# Patient Record
Sex: Female | Born: 1940
Health system: Southern US, Community
[De-identification: ages and names within clinical notes are randomized; demographics above are authoritative.]

## PROBLEM LIST (undated history)

## (undated) DIAGNOSIS — I499 Cardiac arrhythmia, unspecified: Secondary | ICD-10-CM

## (undated) DIAGNOSIS — M1990 Unspecified osteoarthritis, unspecified site: Secondary | ICD-10-CM

## (undated) DIAGNOSIS — R112 Nausea with vomiting, unspecified: Secondary | ICD-10-CM

## (undated) DIAGNOSIS — K219 Gastro-esophageal reflux disease without esophagitis: Secondary | ICD-10-CM

## (undated) DIAGNOSIS — M810 Age-related osteoporosis without current pathological fracture: Secondary | ICD-10-CM

## (undated) DIAGNOSIS — R002 Palpitations: Secondary | ICD-10-CM

## (undated) DIAGNOSIS — L659 Nonscarring hair loss, unspecified: Secondary | ICD-10-CM

## (undated) DIAGNOSIS — Z9889 Other specified postprocedural states: Secondary | ICD-10-CM

## (undated) DIAGNOSIS — R1900 Intra-abdominal and pelvic swelling, mass and lump, unspecified site: Secondary | ICD-10-CM

## (undated) DIAGNOSIS — E7800 Pure hypercholesterolemia, unspecified: Secondary | ICD-10-CM

## (undated) DIAGNOSIS — R0789 Other chest pain: Secondary | ICD-10-CM

## (undated) HISTORY — PX: APPENDECTOMY: SHX54

## (undated) HISTORY — DX: Nonscarring hair loss, unspecified: L65.9

## (undated) HISTORY — DX: Unspecified osteoarthritis, unspecified site: M19.90

## (undated) HISTORY — PX: TOTAL KNEE ARTHROPLASTY: SHX125

## (undated) HISTORY — DX: Pure hypercholesterolemia, unspecified: E78.00

## (undated) HISTORY — DX: Other chest pain: R07.89

## (undated) HISTORY — PX: BREAST BIOPSY: SHX20

## (undated) HISTORY — DX: Age-related osteoporosis without current pathological fracture: M81.0

## (undated) HISTORY — DX: Palpitations: R00.2

## (undated) HISTORY — PX: COLONOSCOPY: SHX174

## (undated) HISTORY — DX: Intra-abdominal and pelvic swelling, mass and lump, unspecified site: R19.00

---

## 1999-04-24 ENCOUNTER — Other Ambulatory Visit: Admission: RE | Admit: 1999-04-24 | Discharge: 1999-04-24 | Payer: Self-pay | Admitting: Obstetrics and Gynecology

## 2000-05-12 ENCOUNTER — Other Ambulatory Visit: Admission: RE | Admit: 2000-05-12 | Discharge: 2000-05-12 | Payer: Self-pay | Admitting: Obstetrics and Gynecology

## 2002-12-02 ENCOUNTER — Other Ambulatory Visit: Admission: RE | Admit: 2002-12-02 | Discharge: 2002-12-02 | Payer: Self-pay | Admitting: Obstetrics and Gynecology

## 2003-12-27 ENCOUNTER — Other Ambulatory Visit: Admission: RE | Admit: 2003-12-27 | Discharge: 2003-12-27 | Payer: Self-pay | Admitting: Obstetrics and Gynecology

## 2004-08-15 ENCOUNTER — Other Ambulatory Visit: Admission: RE | Admit: 2004-08-15 | Discharge: 2004-08-15 | Payer: Self-pay | Admitting: Obstetrics and Gynecology

## 2006-05-28 ENCOUNTER — Encounter: Payer: Self-pay | Admitting: Cardiovascular Disease

## 2006-07-20 ENCOUNTER — Inpatient Hospital Stay (HOSPITAL_COMMUNITY): Admission: RE | Admit: 2006-07-20 | Discharge: 2006-07-22 | Payer: Self-pay | Admitting: Orthopedic Surgery

## 2006-07-21 ENCOUNTER — Ambulatory Visit: Payer: Self-pay | Admitting: Vascular Surgery

## 2006-07-21 ENCOUNTER — Encounter: Payer: Self-pay | Admitting: Vascular Surgery

## 2006-12-08 ENCOUNTER — Encounter: Payer: Self-pay | Admitting: Cardiovascular Disease

## 2008-07-05 ENCOUNTER — Encounter: Payer: Self-pay | Admitting: Cardiovascular Disease

## 2009-01-17 ENCOUNTER — Encounter: Payer: Self-pay | Admitting: Cardiovascular Disease

## 2009-02-19 ENCOUNTER — Encounter: Payer: Self-pay | Admitting: Cardiovascular Disease

## 2009-02-19 ENCOUNTER — Ambulatory Visit: Payer: Self-pay | Admitting: Internal Medicine

## 2009-02-27 ENCOUNTER — Telehealth (INDEPENDENT_AMBULATORY_CARE_PROVIDER_SITE_OTHER): Payer: Self-pay | Admitting: *Deleted

## 2009-02-28 ENCOUNTER — Ambulatory Visit: Payer: Self-pay | Admitting: Internal Medicine

## 2009-02-28 ENCOUNTER — Ambulatory Visit: Payer: Self-pay

## 2009-02-28 ENCOUNTER — Encounter: Payer: Self-pay | Admitting: Internal Medicine

## 2009-02-28 ENCOUNTER — Encounter (HOSPITAL_COMMUNITY): Admission: RE | Admit: 2009-02-28 | Discharge: 2009-04-06 | Payer: Self-pay | Admitting: Internal Medicine

## 2009-02-28 ENCOUNTER — Encounter: Payer: Self-pay | Admitting: Cardiology

## 2009-02-28 DIAGNOSIS — R0989 Other specified symptoms and signs involving the circulatory and respiratory systems: Secondary | ICD-10-CM

## 2009-02-28 HISTORY — DX: Other specified symptoms and signs involving the circulatory and respiratory systems: R09.89

## 2009-07-18 ENCOUNTER — Encounter: Payer: Self-pay | Admitting: Internal Medicine

## 2009-07-26 ENCOUNTER — Encounter: Payer: Self-pay | Admitting: Internal Medicine

## 2010-05-09 NOTE — Letter (Signed)
Summary: [facility]  [facility] Office Note   Imported By: Roderic Ovens 09/24/2009 14:34:33  _____________________________________________________________________  External Attachment:    Type:   Image     Comment:   External Document

## 2010-05-09 NOTE — Progress Notes (Signed)
Summary: Nuclear Pre-Procedure  Phone Note Outgoing Call   Call placed by: Allen Kell Summary of Call: Reviewed information on Myoview Information Sheet (see scanned document for further details).  Spoke with patient.     Nuclear Med Background Indications for Stress Test: Evaluation for Ischemia   History: Echo  History Comments: '08 ECHO (stress-NL) Hx: Afib, abdominal mass, AAA,  H/O recurrant tachy palp (probably SVT).  Symptoms: Chest Pain, Chest Pain with Exertion    Nuclear Pre-Procedure Cardiac Risk Factors: Family History - CAD, Lipids, NIDDM  Nuclear Med Study Referring MD:  Odessa Fleming

## 2010-07-20 ENCOUNTER — Encounter: Payer: Self-pay | Admitting: Cardiovascular Disease

## 2010-08-20 NOTE — Letter (Signed)
February 19, 2009    Gwendlyn Deutscher, M.D.  [address]  [place], [place] [address]   RE:  KANEISHA, ELLENBERGER  MRN:  [id number]  /  DOB:  1940-11-11   Dear Jonny Ruiz,   It is a pleasure seeing Katasha Riga at your request because of her  hypercholesterolemia.  You know her very well.  She is a 70 year old  woman whose husband recently died.  She has a history of diabetes and  hypercholesterolemia and has had various therapies over the last 15  years including statins, acetamide, fenofibrates and niacin.  She has  not tolerated any of these for a long period of time with muscle aches  requiring the termination of most of them.  She was on Crestor which she  tolerated quite well and her insurance company denied payment for it.  She then took simvastatin and apparently broke out in hives.  These  medications were discontinued.   She has a history of exertional chest discomfort which is somewhat  atypical and has prolonged duration.  It is also unassociated with  shortness of breath, radiation, nausea or vomiting.  Her cardiac risk  factors are notable for family history, her hypercholesterolemia,  diabetes and she also has a family history of abdominal aortic aneurysm.   PAST CARDIAC HISTORY:  Also notable for history of atrial  fibrillation.  This dates back to when she was pregnant with her son,  42 years ago.  She recalls the abrupt onset and abrupt offset of tachy  palpitations which she recognizes as fast and regular as opposed to fast  and irregular.  About 5 or 10 years later she was put on digoxin for the  same symptoms.  She has been on it ever since and the medication has  done a pretty good job of controlling it.  As best as she can recall,  there has never been documentation of the tachycardia.   PAST MEDICAL HISTORY:  In addition to the above is notable for a heart  murmur dating back many, many years.  In addition she has allergies, GE  reflux disease.   PAST SURGICAL  HISTORY:  Notable for appendectomy and total knee  replacement.   FAMILY HISTORY:  As noted previously.   SOCIAL HISTORY:  Her husband has recently died.  She has 2 children.  She is retired from [employer].  She does not use cigarettes, alcohol or  recreational drugs.   REVIEW OF SYSTEMS:  Broadly negative apart from what was noted  previously in the past medical history and the HPI.   Review of old records includes a stress echo from 2008, that was normal.   PHYSICAL EXAMINATION:  GENERAL:  She is an elderly Caucasian female  appearing younger than her stated age of 19.  VITAL SIGNS:  Her blood pressure is 118/68, her pulse is 70, her weight  is 135.  She is in no acute distress.  HEENT:  Demonstrated no icterus or xanthomata.  NECK:  Neck veins were flat.  The carotids are brisk and full  bilaterally without bruits.  There is no lymphadenopathy.  BACK:  Without kyphosis or scoliosis.  There is no CVA tenderness.  LUNGS:  Clear.  HEART:  Sounds were regular with a normal S1 and S2.  There is a 2/6 mid  peaking murmur heard best at the left lower sternal border radiating to  the right upper sternal border, not to the apex.  ABDOMEN:  Soft with active bowel  sounds with a broad midline pulsation.  EXTREMITIES:  Femoral pulses were 2+, distal pulses were intact.  There  is no clubbing, cyanosis or edema.  NEUROLOGIC:  Grossly normal.  Skin was warm and dry.  The affect was  appropriate, notwithstanding with intercurrent death of her husband.   Electrocardiogram dated today demonstrated sinus rhythm at 64 with  intervals at 0.15/ 0.19/ 0.41, the axis was leftward and -30.   Lipid panel that you kindly sent included a total cholesterol of 265  with an LDL of 174, and HDL of 38 and triglycerides of 266.   IMPRESSION:  1. Hypercholesterolemia.  2. Diabetes, highly is in need for therapy for hypercholesterolemia.  3. Exertional chest discomfort with cardiac risk factors notable  for;      a.     Above.      b.     Family history.  4. Abdominal mass with a family history of abdominal aortic aneurysm.  5. Recurrent tachy palpitations, probably not atrial fibrillation and      probably supraventricular tachycardia.   DISCUSSION:  John, Ms. Germany's cholesterol issues are a difficult  conundrum.  The first question is that now that she has had a hive  reaction to a statin, does that preclude therapy with other statins like  it does with other classes of medications like ACEs.  I will have to  talk to an allergist about this.  In the event that that is the case, I  wonder if we are not going to be limited to resins.  We also talk to our  [facility] and see if they have any other thoughts.   John, the second issue is her exertional chest discomfort.  This has  been a progressive thing and I think with her multiple cardiac risk  factors, it is probably reasonable to undertake Myoview scanning.  My  concern is further heightened by the presence of a broad abdominal mass  which certainly has a feel of an aneurysm.  It turns out that her  brother has an abdominal aortic aneurysm.  We know that these are often  transmitted in an autosomal dominant way.  The presence of vascular  disease heightens the likelihood of coronary disease.   RECOMMENDATIONS:  Based on the above, we will therefore;  1. Talk to an allergist about the issues of statin therapy.  2. Consider further input from a lipidologist.  3. Myoview scanning, given her history of chest pain.  4. Abdominal ultrasound for her pulsating abdominal mass.  5. Continue her digoxin for her SVT at this point although if she      would have breakthroughs, however, I will consider a catheter      ablation after all these many years.   John, thanks very much for the consultation.    Sincerely,      Duke Salvia, MD, [facility]  Electronically Signed    SCK/MedQ  DD: 02/19/2009  DT: 02/20/2009  Job #:  [id number]

## 2010-08-23 NOTE — Op Note (Signed)
NAMEANAIA, FRITH             ACCOUNT NO.:  [id number]   MEDICAL RECORD NO.:  [id number]          PATIENT TYPE:  INP   LOCATION:  5033                         FACILITY:  [facility]   PHYSICIAN:  Mila Homer. Sherlean Foot, M.D. DATE OF BIRTH:  [birth year]   DATE OF PROCEDURE:  07/20/2006  DATE OF DISCHARGE:                               OPERATIVE REPORT   SURGEON:  Mila Homer. Sherlean Foot, M.D.   ASSISTANTArlys John D. Petrarca, P.A.-C.   ANESTHESIA:  General.   PREOPERATIVE DIAGNOSIS:  Left knee arthritis.   POSTOPERATIVE DIAGNOSIS:  Left knee arthritis.   PROCEDURE:  Left total knee arthroplasty.   TOURNIQUET TIME:  Fifty-one minutes.   COMPLICATIONS:  None.   DRAINS:  None.   INDICATIONS FOR PROCEDURE:  The patient is a 70 year old white female  with failure of conservative measures.  She also had a history of a  patella fracture with retained hardware.  Informed consent was obtained.   DESCRIPTION OF PROCEDURE:  The patient was laid supine and administered  general anesthesia.  The left leg was prepped and draped in the usual  sterile fashion.  After exsanguination of the extremity a midline  incision was made with a #10 blade approximately six inches in length.  A new blade was used to make medial parapatellar arthrotomy and perform  a synovectomy.  I used the #10 blade to elevate the deep MCL off of the  medial crest of the tibia.  Then I used the extramedullary alignment  guide with the knee in flexion to make a perpendicular cut to the  anatomic axis of the tibia removing 2 mm of bone off the medial side.  Then using the intramedullary guide on the femur to make a 60-degree  valgus cut with the sagittal saw.  Then sized to a size E, pinned to the  3-degree external rotation holes and then placed a 4-in-1 cutting block  into place.  Then the anterior, posterior chamfers cuts.  Then placed a  laminar spreader in the knee and performed a medial and lateral menisci,  ACL, PCL, posterior  condylar osteophytes.  We then put a 10-mm special  block in, had good flexion and extension, got balance and a good tibial  cut.  Then finished the femur with a size E finishing block.  Finished  the tibia with a size 5 tibial tray __________ keel and trialed with a  size 5 tibia size E, femur size 10 insert, size 32 patella.  Then chose  these components, copiously irrigated.  Then cemented in the components,  removed excess cement.  Once the cement was hardened, let  the tourniquet down to obtain hemostasis and wash.  I then removed the  patella wire, which was remnant from the old patella fracture.  I then  closed with #1 Vicryl sutures, 0 and 2-0, placed a Hemovac deep to the  arthrotomy coming out superolaterally.           ______________________________  Mila Homer Sherlean Foot, M.D.     SDL/MEDQ  D:  07/20/2006  T:  07/20/2006  Job:  [id number]

## 2010-08-23 NOTE — H&P (Signed)
Robin Castro, Robin Castro             ACCOUNT NO.:  [id number]   MEDICAL RECORD NO.:  [id number]          PATIENT TYPE:  INP   LOCATION:                               FACILITY:  [facility]   PHYSICIAN:  Richardean Canal, P.A.    DATE OF BIRTH:  05/07/1940   DATE OF ADMISSION:  07/20/2006  DATE OF DISCHARGE:                              HISTORY & PHYSICAL   CHIEF COMPLAINT:  Left knee pain.   HISTORY OF PRESENT ILLNESS:  The patient is a 70 year old white female  with left knee pain for 1-1/2 years.  The pain is worse over the past  few months.  History of left patellar fracture with ORIF.  Pain is worse  with ambulation in the medial and posterior aspects of the knee.  Mechanical symptoms positive for catching, locking, and giving way.  No  cane.  Positive waking pain.  She has failed conservative treatment.  X-  rays to the left knee showed complete collapse of medial compartment  with tricompartmental OA.   ALLERGIES:  Codeine.  Celebrex causes nausea and itching.   PAST MEDICAL HISTORY:  1. History of paroxysmal atrial fibrillation.  2. Diabetes mellitus type 2.  3. GERD.  4. Hypercholesterolemia.  5. OA of the left knee.   PAST SURGICAL HISTORY:  1. Appendectomy in 1957.  2. Left knee surgery open reduction and internal fixation of the      patella in 1978.  3. The patient denies any complications with the above surgery except      for some nausea after knee surgery in 1978.  No blood transfusions.   SOCIAL HISTORY:  She denies any tobacco or alcohol use.  She is married  and lives in a 1-story home with 5 steps to the usual entrance.   FAMILY HISTORY:  The patient's mother, deceased at age 64, had a history  of heart disease, myocardial infarction, hypertension, diabetes, and  stroke.  Father, deceased in his 80s, had a history of heart disease,  myocardial infarction, hypertension, and stroke.  She has one living  brother age 103 with diabetes, a sister age 40 with  hypertension.  In  all, she has 10 siblings.  The other all are reportedly healthy.   REVIEW OF SYSTEMS:  The patient wears glasses.  Reports heart murmur.  Reports history of atrial fibrillation.  Diabetes mellitus.  She has  nocturia 2-3 times per night, unchanged.  She denies any recent fevers,  chills, or flu-like symptoms, although she has had a sore throat for one  day.  Otherwise, review of systems is negative or noncontributory.   PHYSICAL EXAMINATION:  VITAL SIGNS:  Height 5 feet 5 inches, weight 150  pounds.  Temperature is 99.1, pulse is 80, respiratory rate 16, blood  pressure 110/70.  CARDIAC:  Regular rate and rhythm, 2/6 systolic murmur noted.  LUNGS:  Clear to auscultation bilaterally.  No wheezing, rhonchi, or  rales.  ABDOMEN:  Soft, nontender.  Bowel sounds times 4 quadrants.  HEENT:  Head is normocephalic.  No frontal or maxillary sinus tenderness  to palpation.  No visible external  air deformities.  Tympanic membranes  are pearly gray bilaterally.  Nose:  Nasal septum midline.  Nasal mucosa  is pink and moist without polyps.  Buccal mucosa pink and moist.  The  patient has good dental repair.  Tongue and uvula are midline.  Pharynx  is without erythema or exudate.  BREASTS, GENITOURINARY, AND RECTAL:  All deferred at this time.  NECK:  Trachea is midline.  No lymphadenopathy.  Carotids are 2+ without  bruits.  No tenderness along the spine.  She has full range of motion of  the cervical spine.  NEUROLOGIC:  Cranial nerves II-XII are grossly intact.  The patient is  alert and oriented times 3.  Lower extremity strength testing is 5/5  throughout.  Deep tendon reflexes:  Unable to elicit the reflexes at the  knees bilaterally.  This is equal and symmetric.  There are 1+ deep  tendon reflexes in the ankles bilaterally.  This is equal and symmetric.  MUSCULOSKELETAL:  Upper extremities are equal and symmetric in size and  shape.  She has full range of motion in the  shoulders, elbows, wrists,  and hands.  Radial pulses are 2+ bilaterally, equal and symmetric.  She  has good sensation to light touch in the tips of the fingers throughout.  LOWER EXTREMITIES:  She has full range of motion of both hips without  pain.  Left knee 0-115 degrees of flexion.  Positive tenderness along  the medial and lateral joint line.  No effusion.  No instability to  valgus and varus stressing.  Right knee has good range of motion without  pain.  No effusion and no edema is noted.  Calves:  Bilaterally supple  and nontender.  Dorsal and pedal pulses are 2+, equal, and symmetric.   X-RAYS:  X-rays of the left knee shows complete collapse of medial  compartment with tricompartmental osteoarthritis.   PLAN:  The patient is to undergo all appropriate labs and testing prior  to surgery in the hopes that the patient can undergo a left total knee  arthroplasty on July 20, 2006, by Dr. Georgena Spurling.  Prior to surgery,  the patient did receive clearance from her primary care physician, Dr.  Gwendlyn Deutscher of [facility] in [place], [place].      Richardean Canal, P.A.     GC/MEDQ  D:  07/15/2006  T:  07/16/2006  Job:  [id number]   cc:   Mila Homer. Sherlean Foot, M.D.

## 2010-08-27 ENCOUNTER — Encounter: Payer: Self-pay | Admitting: Internal Medicine

## 2010-10-21 ENCOUNTER — Encounter: Payer: Self-pay | Admitting: Internal Medicine

## 2010-10-21 ENCOUNTER — Ambulatory Visit (INDEPENDENT_AMBULATORY_CARE_PROVIDER_SITE_OTHER): Payer: Medicare Other | Admitting: Internal Medicine

## 2010-10-21 VITALS — BP 122/77 | HR 68 | Ht 65.0 in | Wt 136.0 lb

## 2010-10-21 DIAGNOSIS — E785 Hyperlipidemia, unspecified: Secondary | ICD-10-CM

## 2010-10-21 HISTORY — DX: Hyperlipidemia, unspecified: E78.5

## 2010-10-21 NOTE — Assessment & Plan Note (Signed)
The patient has ongoing problems with hyperlipidemia. She has recently been started on Lovasa which was associated with a decrease in her triglycerides from the mid 300s to the 180 range. Her LDL was 176 and HDL 43 and a last month. Hemoglobin A1c was 65. We will plan to refer her to the lipid clinic [place]. I will ask them to forward their recommendations to Dr. Jeanie Sewer

## 2010-10-21 NOTE — Progress Notes (Signed)
  HPI  Robin Castro is a 70 y.o. female seen at te request Dr Jeanie Sewer for management Hypercholesterolemia in the setting of what is thought to be a anaphylactic/hive reaction to statin therapy. She has diabetes and hypertension as comorbidities.  Because of chest pain and abdominal pulsatile mass in 2010 she underwent evaluation with a Myoview that was normal and an abdominal ultrasound which was also normal   Past Medical History  Diagnosis Date  . Hypercholesteremia   . Diabetes mellitus   . Chest discomfort   . Palpitations     Past Surgical History  Procedure Date  . Appendectomy   . Total knee arthroplasty     Current Outpatient Prescriptions  Medication Sig Dispense Refill  . aspirin 81 MG tablet Take 81 mg by mouth daily.        . Calcium Carbonate-Vitamin D (CALCIUM 600 + D PO) Take 1 tablet by mouth daily.        . digoxin (LANOXIN) 0.25 MG tablet 1 tab daily alternating with 1 1/2 tab daily       . ergocalciferol (VITAMIN D2) 50000 UNITS capsule Take 50,000 Units by mouth once a week.        . losartan (COZAAR) 25 MG tablet Take 25 mg by mouth daily.        Marland Kitchen LOVAZA 1 G capsule Take 2 capsules by mouth Twice daily.      . metFORMIN (GLUCOPHAGE) 850 MG tablet Take 850 mg by mouth 2 (two) times daily with a meal.        . omeprazole (PRILOSEC) 20 MG capsule Take 20 mg by mouth daily.          Allergies  Allergen Reactions  . Niacin And Related   . Prevacid   . Statins     Review of Systems negative except from HPI and PMH  Physical Exam Well developed and well nourished in no acute distress HENT normal E scleral and icterus clear Neck Supple JVP flat; carotids brisk and full Clear to ausculation Regular rate and rhythm, no murmurs gallops or rub Soft with active bowel sounds No clubbing cyanosis and edema Alert and oriented, grossly normal motor and sensory function Skin Warm and Dry   Assessment and  Plan

## 2010-11-04 ENCOUNTER — Ambulatory Visit: Payer: No Typology Code available for payment source

## 2010-11-18 ENCOUNTER — Ambulatory Visit (INDEPENDENT_AMBULATORY_CARE_PROVIDER_SITE_OTHER): Payer: Medicare Other

## 2010-11-18 DIAGNOSIS — E785 Hyperlipidemia, unspecified: Secondary | ICD-10-CM

## 2010-12-03 NOTE — Progress Notes (Signed)
HPI  Robin Castro is a pleasant 70 yr old F who presents for initial evaluation to Lipid Clinic.  She was referred by her PCP, Dr. Jeanie Sewer to Dr. Graciela Husbands in the [facility] office for management of her hyperlipidemia.  Reviewed pt's history with lipid lowering therapies in detail:   Simvastatin- whelps, swelling, and muscle pain Crestor- able to tolerate but was not covered by insurance Niacin- flushing Lipitor/pravastatin/Tricor- muscle pain Zetia- no improvement in cholesterol  Welchol- ? Tolerability   She is currently taking Lovaza 4 gm daily.  She supplied her most recent lab work from May (this is while she was on Lovaza).  TC- 257, TG- 188, HDL- 43, and LDL- 176.  A1c- 6.5 and vitamin D- 34.7.   Her past medical history is significant for DM but this is currently well controlled based on A1c and her fasting CBGs are 98-106.  Her mother had MI when she was <62 yrs old and father with MI at 24.    Review of pt's diet shows she eats a fairly healthy diet.  For breakfast she will have 1/2 bowl of cereal (usually some type of grain cereal or Cheerios) with strawberries or banana and water.  Lunch is salad with vinaigrette and grilled chicken.  Dinner is often more vegetables than meat such as broccoli, asparagus, sweet potatoes and beans.  She will eat fish 2-3 times a week, chicken or pork chops.  She rarely fries her food.  She does like sweets but only has a few bites and keeps sugar free candy in the house.   Pt exercises on a regular basis. She will walk  2 miles 4-5 days per week.  This usually takes her around 40 minutes to complete.   Current Outpatient Prescriptions on File Prior to Visit  Medication Sig Dispense Refill  . aspirin 81 MG tablet Take 81 mg by mouth daily.        . Calcium Carbonate-Vitamin D (CALCIUM 600 + D PO) Take 1 tablet by mouth daily.        . digoxin (LANOXIN) 0.25 MG tablet 1 tab daily alternating with 1 1/2 tab daily       . ergocalciferol (VITAMIN D2) 50000 UNITS  capsule Take 50,000 Units by mouth every 30 (thirty) days.       Marland Kitchen losartan (COZAAR) 25 MG tablet Take 25 mg by mouth daily.        Marland Kitchen LOVAZA 1 G capsule Take 2 capsules by mouth Twice daily.      . metFORMIN (GLUCOPHAGE) 850 MG tablet Take 850 mg by mouth 2 (two) times daily with a meal.        . omeprazole (PRILOSEC) 20 MG capsule Take 20 mg by mouth daily.          Allergies  Allergen Reactions  . Prevacid     Throat swelling  . Niacin And Related   . Statins

## 2010-12-17 NOTE — Assessment & Plan Note (Signed)
[name] current labs: TC- 257 (goal<200), TG- 188 (goal<150), HDL- 43 (goal>45) and LDL- 176 (goal<70).  Pt has had a long history of intolerances to medications, but given family and personal history, pt would benefit from statin therapy.  She was able to tolerate Crestor in the past and only quit due to financial reasons.  Will restart that at this time.  She has a fairly healthy diet and exercise program already.   Will have her continue.  F/u in 2 months.

## 2011-01-10 ENCOUNTER — Encounter: Payer: Self-pay | Admitting: Internal Medicine

## 2011-01-20 ENCOUNTER — Ambulatory Visit: Payer: Medicare Other

## 2011-01-23 ENCOUNTER — Ambulatory Visit: Payer: Medicare Other

## 2011-01-30 ENCOUNTER — Ambulatory Visit (INDEPENDENT_AMBULATORY_CARE_PROVIDER_SITE_OTHER): Payer: Medicare Other

## 2011-01-30 VITALS — BP 126/82 | HR 66 | Wt 141.0 lb

## 2011-01-30 DIAGNOSIS — E785 Hyperlipidemia, unspecified: Secondary | ICD-10-CM

## 2011-01-30 NOTE — Assessment & Plan Note (Addendum)
Assessment: Dyslipidemia markedly improved since last office visit- patient is pleased. Labs from 10/8: TC 132 (goal <200), TG 103 (goal <150), HDL 40 (goal >45), LDL 71 (goal <70). LFTs WNL.  Plan: Continue current medication regimen, as well as lifestyle modifications made since last appointment. Gave samples of Crestor, along with co-pay assistance card, and a prescription. Follow up in 6 months with lipid clinic to reassess control.  Pt has a prescription to have labs checked by PCP in .

## 2011-01-30 NOTE — Patient Instructions (Signed)
Continue Crestor 5mg  daily and fish oil  We will call you to make an appt in April.

## 2011-01-30 NOTE — Progress Notes (Signed)
Agree. cdm 

## 2011-01-30 NOTE — Progress Notes (Signed)
Robin Castro is a 70 year old white female presenting to the lipid clinic for 2 month follow up of her dyslipidemia. Current cholesterol medications include Crestor 5 mg daily and Lovaza 4 gm daily. Patient reports that she is compliant with her medications, although she ran out of her Crestor about 2 weeks ago and has not taken any during that time. She denies any issues tolerating either medication. Past intolerances include simvastatin, niacin, Lipitor, pravastatin, Tricor, Zetia, and Welchol.  Diet: Patient states that she has been putting a lot of effort into making healthy changes to her diet. She has cut out all fast foods, soft drinks, and iced tea. She uses Smart Balance and olive oil when cooking, and does not eat very much meat or bread. She now drinks 8-9 glasses of water a day. Breakfast consists of things like a boiled egg without the yolk, a half of a bagel, and a glass of skim milk. Lunch is a salad or half of a sandwich with soup. Sandwiches are banana or lettuce and tomato with a little mayo and are made using whole wheat bread. Dinners are baked/broiled chicken breast with vegetables, such as asparagus, broccoli, pinto beans, cabbage, or a salad. Robin Castro only eats one plate of food at meals, and rarely snacks. When she does snack, she will have a piece of fruit such as an apple, orange, or some grapes. She eats sugar free candies every now and then when she has a craving for a sweet.  Exercise: Robin Castro states that she walks 2 miles 4-5 days per week. She enjoys staying active to benefit not only her hyperlipidemia, but also her diabetes.  Social History: Denies use of tobacco or alcohol.  Current Outpatient Prescriptions  Medication Sig Dispense Refill  . rosuvastatin (CRESTOR) 10 MG tablet Take 5 mg by mouth daily.        Marland Kitchen aspirin 81 MG tablet Take 81 mg by mouth daily.        . Calcium Carbonate-Vitamin D (CALCIUM 600 + D PO) Take 1 tablet by mouth daily.        . digoxin  (LANOXIN) 0.25 MG tablet 1 tab daily alternating with 1 1/2 tab daily       . ergocalciferol (VITAMIN D2) 50000 UNITS capsule Take 50,000 Units by mouth every 30 (thirty) days.       Marland Kitchen losartan (COZAAR) 25 MG tablet Take 25 mg by mouth daily.        Marland Kitchen LOVAZA 1 G capsule Take 2 capsules by mouth Twice daily.      . metFORMIN (GLUCOPHAGE) 850 MG tablet Take 850 mg by mouth 2 (two) times daily with a meal.        . omeprazole (PRILOSEC) 20 MG capsule Take 20 mg by mouth daily.

## 2011-05-07 ENCOUNTER — Other Ambulatory Visit: Payer: Self-pay | Admitting: Pharmacist

## 2011-05-07 MED ORDER — ROSUVASTATIN CALCIUM 5 MG PO TABS: 5.0000 mg | ORAL_TABLET | Freq: Every day | ORAL | Status: DC

## 2011-07-22 ENCOUNTER — Encounter: Payer: Self-pay | Admitting: Internal Medicine

## 2011-07-31 ENCOUNTER — Ambulatory Visit (INDEPENDENT_AMBULATORY_CARE_PROVIDER_SITE_OTHER): Payer: Medicare Other | Admitting: Pharmacist

## 2011-07-31 DIAGNOSIS — E785 Hyperlipidemia, unspecified: Secondary | ICD-10-CM

## 2011-07-31 NOTE — Patient Instructions (Signed)
Your cholesterol looks great.  Continue your current medications.   Follow up with Dr. Jeanie Sewer this fall to recheck your cholesterol.

## 2011-07-31 NOTE — Progress Notes (Signed)
Robin Castro is a 71 year old white female presenting to the lipid clinic for 6 month follow up of her dyslipidemia. Current cholesterol medications include Crestor 5 mg daily and Lovaza 4 gm daily. Patient reports that she is compliant with her medications and she has not had any issues tolerating either medication. Past intolerances include simvastatin, niacin, Lipitor, pravastatin, Tricor, Zetia, and Welchol.  Diet: Patient has started seeing a dietician to help with her diet.  She has learned how to count carbs and is limiting her meals to 45 carbs and snacks to 15-30g of carbs. She has cut out all of the bread from her diet and increased the vegetables.  She is using sweet potatoes and pasta for her carbohydrates.  She does not eat a lot of meat, but likes fish (salmon, grouper, talapia, and flounder) and some chicken.  She is trying to snack between meals on things such as fruit and yogurt, cottage cheese, peanut butter or cheese crackers.  She continues to drink 8-9 glasses of water each day.  Exercise: Robin Castro states that she walks 2.5-3 miles 4-5 days per week. She enjoys staying active to benefit not only her hyperlipidemia, but also her diabetes.  Social History: Denies use of tobacco or alcohol.  Current Outpatient Prescriptions  Medication Sig Dispense Refill  . aspirin 81 MG tablet Take 81 mg by mouth daily.        . Calcium Carbonate-Vitamin D (CALCIUM 600 + D PO) Take 1 tablet by mouth daily.        . digoxin (LANOXIN) 0.25 MG tablet 1 tab daily alternating with 1 1/2 tab daily       . ergocalciferol (VITAMIN D2) 50000 UNITS capsule Take 50,000 Units by mouth every 30 (thirty) days.       Marland Kitchen losartan (COZAAR) 25 MG tablet Take 25 mg by mouth daily.        Marland Kitchen LOVAZA 1 G capsule Take 2 capsules by mouth Twice daily.      . metFORMIN (GLUCOPHAGE) 850 MG tablet Take 850 mg by mouth 2 (two) times daily with a meal.        . omeprazole (PRILOSEC) 20 MG capsule Take 20 mg by mouth daily.         . rosuvastatin (CRESTOR) 5 MG tablet Take 1 tablet (5 mg total) by mouth at bedtime.  90 tablet  3   Allergies  Allergen Reactions  . Prevacid     Throat swelling  . Statins     Tolerates Crestor  . Enalapril Cough  . Niacin And Related Rash    hives

## 2011-07-31 NOTE — Assessment & Plan Note (Signed)
[name] lab work from PCP showed: TC- 131 (goal<200), TG- 89 (goal<150), HDL- 42 (goal>45), LDL- 71 (goal<70).  LFTs are WNL.  A1c- 7.0.  Pt tolerating current therapy and cholesterol remains at goal.  Will continue Crestor 5mg  daily and Lovaza 4gm daily.  Encouraged pt to continue her lifestyle changes.  Will have her f/u with her PCP in the future for cholesterol management.  Pt is aware if she has any problems to contact our office and we will re-establish her in the lipid clinic.

## 2011-07-31 NOTE — Progress Notes (Signed)
Robin Castro is a 71 year old white female presenting to the lipid clinic for 6 month follow up of her dyslipidemia. Current cholesterol medications include Crestor 5 mg daily and Lovaza 4 gm daily. Patient reports that she is compliant with her medications and is tolerating   Diet: Patient states that she has been putting a lot of effort into making healthy changes to her diet. She has cut out all fast foods, soft drinks, and iced tea. She uses Smart Balance and olive oil when cooking, and does not eat very much meat or bread. She now drinks 8-9 glasses of water a day. Breakfast consists of things like a boiled egg without the yolk, a half of a bagel, and a glass of skim milk. Lunch is a salad or half of a sandwich with soup. Sandwiches are banana or lettuce and tomato with a little mayo and are made using whole wheat bread. Dinners are baked/broiled chicken breast with vegetables, such as asparagus, broccoli, pinto beans, cabbage, or a salad. Robin Castro only eats one plate of food at meals, and rarely snacks. When she does snack, she will have a piece of fruit such as an apple, orange, or some grapes. She eats sugar free candies every now and then when she has a craving for a sweet.  Exercise: Robin Castro states that she walks 2 miles 4-5 days per week. She enjoys staying active to benefit not only her hyperlipidemia, but also her diabetes.  Social History: Denies use of tobacco or alcohol.  Current Outpatient Prescriptions  Medication Sig Dispense Refill  . aspirin 81 MG tablet Take 81 mg by mouth daily.        . Calcium Carbonate-Vitamin D (CALCIUM 600 + D PO) Take 1 tablet by mouth daily.        . digoxin (LANOXIN) 0.25 MG tablet 1 tab daily alternating with 1 1/2 tab daily       . ergocalciferol (VITAMIN D2) 50000 UNITS capsule Take 50,000 Units by mouth every 30 (thirty) days.       Marland Kitchen losartan (COZAAR) 25 MG tablet Take 25 mg by mouth daily.        Marland Kitchen LOVAZA 1 G capsule Take 2 capsules by mouth  Twice daily.      . metFORMIN (GLUCOPHAGE) 850 MG tablet Take 850 mg by mouth 2 (two) times daily with a meal.        . omeprazole (PRILOSEC) 20 MG capsule Take 20 mg by mouth daily.        . rosuvastatin (CRESTOR) 5 MG tablet Take 1 tablet (5 mg total) by mouth at bedtime.  90 tablet  3

## 2012-09-30 DIAGNOSIS — Z96659 Presence of unspecified artificial knee joint: Secondary | ICD-10-CM

## 2012-09-30 HISTORY — DX: Presence of unspecified artificial knee joint: Z96.659

## 2014-04-13 ENCOUNTER — Encounter (HOSPITAL_COMMUNITY): Payer: Self-pay

## 2014-04-13 ENCOUNTER — Encounter (HOSPITAL_COMMUNITY)
Admission: RE | Admit: 2014-04-13 | Discharge: 2014-04-13 | Disposition: A | Discharge status: Home / Self Care | Payer: Commercial Managed Care - HMO | Source: Ambulatory Visit | Attending: Obstetrics and Gynecology | Admitting: Obstetrics and Gynecology

## 2014-04-13 DIAGNOSIS — Z01812 Encounter for preprocedural laboratory examination: Secondary | ICD-10-CM | POA: Diagnosis not present

## 2014-04-13 HISTORY — DX: Cardiac arrhythmia, unspecified: I49.9

## 2014-04-13 HISTORY — DX: Gastro-esophageal reflux disease without esophagitis: K21.9

## 2014-04-13 LAB — CBC
HEMATOCRIT: 40.8 % (ref 36.0–46.0)
HEMOGLOBIN: 13.3 g/dL (ref 12.0–15.0)
MCH: 29.4 pg (ref 26.0–34.0)
MCHC: 32.6 g/dL (ref 30.0–36.0)
MCV: 90.3 fL (ref 78.0–100.0)
Platelets: 304 10*3/uL (ref 150–400)
RBC: 4.52 MIL/uL (ref 3.87–5.11)
RDW: 13.2 % (ref 11.5–15.5)
WBC: 6.9 10*3/uL (ref 4.0–10.5)

## 2014-04-13 LAB — BASIC METABOLIC PANEL
Anion gap: 7 (ref 5–15)
BUN: 9 mg/dL (ref 6–23)
CHLORIDE: 103 meq/L (ref 96–112)
CO2: 29 mmol/L (ref 19–32)
Calcium: 9 mg/dL (ref 8.4–10.5)
Creatinine, Ser: 0.5 mg/dL (ref 0.50–1.10)
Glucose, Bld: 221 mg/dL — ABNORMAL HIGH (ref 70–99)
POTASSIUM: 3.9 mmol/L (ref 3.5–5.1)
Sodium: 139 mmol/L (ref 135–145)

## 2014-04-13 NOTE — Pre-Procedure Instructions (Addendum)
Dr. Lyndle Herrlich notified re non- fasting glucose result of 221. No further orders other than rechecking glucose on am of surgery.

## 2014-04-13 NOTE — Patient Instructions (Signed)
Your procedure is scheduled on:04/19/14  Enter through the Main Entrance at :[place] up desk phone and dial [phone] and inform us of your arrival.  Please call [phone] if you have any problems the morning of surgery.  Remember: Do not eat food or drink liquids, including water, after midnight:Monday   You may brush your teeth the morning of surgery.  Take these meds the morning of surgery with a sip of water:Digoxin HOLD METFORMIN FOR 24 HOURS PRIOR TO SURGERY  DO NOT wear jewelry, eye make-up, lipstick,body lotion, or dark fingernail polish.  (Polished toes are ok) You may wear deodorant.  If you are to be admitted after surgery, leave suitcase in car until your room has been assigned. Patients discharged on the day of surgery will not be allowed to drive home. Wear loose fitting, comfortable clothes for your ride home.

## 2014-04-14 NOTE — Pre-Procedure Instructions (Signed)
Robin Castro Pre-op this patient on Thursday, Jan 7th.   Follow-up with obtaining copy of patient's EKG with [name] in medical records at [phone].  Requested her to fax it to [place] at [phone].

## 2014-04-18 ENCOUNTER — Other Ambulatory Visit: Payer: Self-pay | Admitting: Obstetrics and Gynecology

## 2014-04-18 NOTE — H&P (Signed)
74 y.o. yo complains of lower pelvic pain, significant prolapse and pressure.  Past Medical History  Diagnosis Date  . Hypercholesteremia   . Chest discomfort     nl myoview 2010  . Palpitations   . Pulsatile abdominal mass     neg U/S 2010  . Diabetes mellitus     type 2- x 12 years  . Dysrhythmia     h/o rapid heart beat  . GERD (gastroesophageal reflux disease)    Past Surgical History  Procedure Laterality Date  . Appendectomy    . Total knee arthroplasty      History   Social History  . Marital Status: Widowed    Spouse Name: N/A    Number of Children: 2  . Years of Education: N/A   Occupational History  . retired IT trainer    Social History Main Topics  . Smoking status: Never Smoker   . Smokeless tobacco: Not on file  . Alcohol Use: No  . Drug Use: No  . Sexual Activity: Not on file   Other Topics Concern  . Not on file   Social History Narrative    No current facility-administered medications on file prior to encounter.   Current Outpatient Prescriptions on File Prior to Encounter  Medication Sig Dispense Refill  . aspirin 81 MG tablet Take 81 mg by mouth daily.      . Calcium Carbonate-Vitamin D (CALCIUM 600 + D PO) Take 1 tablet by mouth 2 (two) times daily.     . digoxin (LANOXIN) 0.25 MG tablet 1 tab daily alternating with 1 1/2 tab daily     . ergocalciferol (VITAMIN D2) 50000 UNITS capsule Take 50,000 Units by mouth every 14 (fourteen) days.     Marland Kitchen losartan (COZAAR) 25 MG tablet Take 25 mg by mouth daily.      Marland Kitchen LOVAZA 1 G capsule Take 2 capsules by mouth Twice daily.    Marland Kitchen omeprazole (PRILOSEC) 20 MG capsule Take 20 mg by mouth daily.      . metFORMIN (GLUCOPHAGE) 850 MG tablet Take 850 mg by mouth 2 (two) times daily with a meal.      . rosuvastatin (CRESTOR) 5 MG tablet Take 1 tablet (5 mg total) by mouth at bedtime. (Patient not taking: Reported on 04/05/2014) 90 tablet 3    Allergies  Allergen Reactions  . Lansoprazole     Throat  swelling  . Statins Hives    Tolerates Crestor  . Enalapril Cough  . Niacin And Related Hives and Rash    hives    @VITALS2 @  Lungs: clear to ascultation Cor:  RRR Abdomen:  soft, nontender, nondistended. Ex:  no cords, erythema Pelvic:  Vulva normal, cystocele 0 to hymen, rectocele -3.  Cervix normal and uterus normal s/s.  Prolapse to -2 above hymen.  No masses felt.  Uretrha>>45.  [place] 6x3x4, em 4 mm, normal ovaries.  A:  For [facility] for prolapse of uterus.  Cystocele is so significant that she does not have leaking now but surely will after A Repair- for TVT and A repair.  No splinting.  Will get ovaries/tubes if possible and safe.   P:  All risks, benefits and alternatives d/w patient and she desires to proceed.  Patient will receive preop antibiotics and SCDs during the operation.     Rockford Leinen A

## 2014-04-19 ENCOUNTER — Encounter (HOSPITAL_COMMUNITY): Payer: Self-pay | Admitting: *Deleted

## 2014-04-19 ENCOUNTER — Ambulatory Visit (HOSPITAL_COMMUNITY)
Admission: RE | Admit: 2014-04-19 | Discharge: 2014-04-20 | Disposition: A | Discharge status: Home / Self Care | Payer: Commercial Managed Care - HMO | Source: Ambulatory Visit | Attending: Obstetrics and Gynecology | Admitting: Obstetrics and Gynecology

## 2014-04-19 ENCOUNTER — Encounter (HOSPITAL_COMMUNITY)
Admission: RE | Disposition: A | Discharge status: Home / Self Care | Payer: Self-pay | Source: Ambulatory Visit | Attending: Obstetrics and Gynecology

## 2014-04-19 ENCOUNTER — Ambulatory Visit (HOSPITAL_COMMUNITY): Payer: Commercial Managed Care - HMO | Admitting: Anesthesiology

## 2014-04-19 DIAGNOSIS — Z9889 Other specified postprocedural states: Secondary | ICD-10-CM

## 2014-04-19 DIAGNOSIS — N393 Stress incontinence (female) (male): Secondary | ICD-10-CM | POA: Diagnosis not present

## 2014-04-19 DIAGNOSIS — N812 Incomplete uterovaginal prolapse: Secondary | ICD-10-CM | POA: Insufficient documentation

## 2014-04-19 DIAGNOSIS — E780 Pure hypercholesterolemia: Secondary | ICD-10-CM | POA: Insufficient documentation

## 2014-04-19 DIAGNOSIS — N3641 Hypermobility of urethra: Secondary | ICD-10-CM | POA: Insufficient documentation

## 2014-04-19 DIAGNOSIS — N840 Polyp of corpus uteri: Secondary | ICD-10-CM | POA: Diagnosis not present

## 2014-04-19 DIAGNOSIS — Z7982 Long term (current) use of aspirin: Secondary | ICD-10-CM | POA: Insufficient documentation

## 2014-04-19 DIAGNOSIS — R002 Palpitations: Secondary | ICD-10-CM | POA: Diagnosis not present

## 2014-04-19 DIAGNOSIS — K219 Gastro-esophageal reflux disease without esophagitis: Secondary | ICD-10-CM | POA: Diagnosis not present

## 2014-04-19 DIAGNOSIS — E119 Type 2 diabetes mellitus without complications: Secondary | ICD-10-CM | POA: Diagnosis not present

## 2014-04-19 DIAGNOSIS — Z888 Allergy status to other drugs, medicaments and biological substances status: Secondary | ICD-10-CM | POA: Diagnosis not present

## 2014-04-19 HISTORY — DX: Other specified postprocedural states: Z98.890

## 2014-04-19 HISTORY — PX: BILATERAL SALPINGECTOMY: SHX5743

## 2014-04-19 HISTORY — PX: BLADDER SUSPENSION: SHX72

## 2014-04-19 HISTORY — PX: CYSTOCELE REPAIR: SHX163

## 2014-04-19 HISTORY — DX: Other specified postprocedural states: R11.2

## 2014-04-19 HISTORY — PX: VAGINAL HYSTERECTOMY: SHX2639

## 2014-04-19 HISTORY — PX: CYSTOSCOPY: SHX5120

## 2014-04-19 LAB — GLUCOSE, CAPILLARY
GLUCOSE-CAPILLARY: 163 mg/dL — AB (ref 70–99)
GLUCOSE-CAPILLARY: 148 mg/dL — AB (ref 70–99)
Glucose-Capillary: 118 mg/dL — ABNORMAL HIGH (ref 70–99)

## 2014-04-19 SURGERY — HYSTERECTOMY, VAGINAL
Anesthesia: General | Site: Vagina

## 2014-04-19 MED ORDER — ONDANSETRON HCL 4 MG PO TABS: 4.0000 mg | ORAL_TABLET | Freq: Four times a day (QID) | ORAL | Status: DC | PRN

## 2014-04-19 MED ORDER — KETOROLAC TROMETHAMINE 60 MG/2ML IM SOLN
INTRAMUSCULAR | Status: DC | PRN
  Administered 2014-04-19: 15 mg via INTRAMUSCULAR

## 2014-04-19 MED ORDER — DIGOXIN 250 MCG PO TABS: 0.2500 mg | ORAL_TABLET | Freq: Every day | ORAL | Status: DC

## 2014-04-19 MED ORDER — LACTATED RINGERS IV SOLN
INTRAVENOUS | Status: DC
  Administered 2014-04-19 (×2): via INTRAVENOUS

## 2014-04-19 MED ORDER — PROPOFOL INFUSION 10 MG/ML OPTIME
INTRAVENOUS | Status: DC | PRN
  Administered 2014-04-19: 10 mL via INTRAVENOUS

## 2014-04-19 MED ORDER — ESTRADIOL 0.1 MG/GM VA CREA
TOPICAL_CREAM | VAGINAL | Status: AC
  Filled 2014-04-19: qty 42.5

## 2014-04-19 MED ORDER — ACETAMINOPHEN 160 MG/5ML PO SOLN: 325.0000 mg | ORAL | Status: DC | PRN

## 2014-04-19 MED ORDER — ONDANSETRON HCL 4 MG/2ML IJ SOLN: 4.0000 mg | Freq: Four times a day (QID) | INTRAMUSCULAR | Status: DC | PRN

## 2014-04-19 MED ORDER — METFORMIN HCL 500 MG PO TABS
1000.0000 mg | ORAL_TABLET | Freq: Two times a day (BID) | ORAL | Status: DC
  Administered 2014-04-19 – 2014-04-20 (×2): 1000 mg via ORAL
  Filled 2014-04-19 (×2): qty 2

## 2014-04-19 MED ORDER — CEFAZOLIN SODIUM-DEXTROSE 2-3 GM-% IV SOLR
INTRAVENOUS | Status: AC
  Filled 2014-04-19: qty 50

## 2014-04-19 MED ORDER — KETOROLAC TROMETHAMINE 30 MG/ML IJ SOLN: 30.0000 mg | Freq: Four times a day (QID) | INTRAMUSCULAR | Status: DC

## 2014-04-19 MED ORDER — PROPOFOL 10 MG/ML IV BOLUS
INTRAVENOUS | Status: AC
  Filled 2014-04-19: qty 20

## 2014-04-19 MED ORDER — PANTOPRAZOLE SODIUM 40 MG PO TBEC: 40.0000 mg | DELAYED_RELEASE_TABLET | Freq: Every day | ORAL | Status: DC

## 2014-04-19 MED ORDER — ACETAMINOPHEN 325 MG PO TABS: 325.0000 mg | ORAL_TABLET | ORAL | Status: DC | PRN

## 2014-04-19 MED ORDER — LINAGLIPTIN 5 MG PO TABS
5.0000 mg | ORAL_TABLET | Freq: Every day | ORAL | Status: DC
  Administered 2014-04-19: 5 mg via ORAL
  Filled 2014-04-19 (×2): qty 1

## 2014-04-19 MED ORDER — MEPERIDINE HCL 25 MG/ML IJ SOLN: 6.2500 mg | INTRAMUSCULAR | Status: DC | PRN

## 2014-04-19 MED ORDER — SUCCINYLCHOLINE CHLORIDE 20 MG/ML IJ SOLN
INTRAMUSCULAR | Status: AC
  Filled 2014-04-19: qty 1

## 2014-04-19 MED ORDER — ONDANSETRON HCL 4 MG/2ML IJ SOLN
INTRAMUSCULAR | Status: AC
  Filled 2014-04-19: qty 2

## 2014-04-19 MED ORDER — DIGOXIN 250 MCG PO TABS: 0.2500 mg | ORAL_TABLET | ORAL | Status: DC

## 2014-04-19 MED ORDER — SUCCINYLCHOLINE CHLORIDE 20 MG/ML IJ SOLN
INTRAMUSCULAR | Status: DC | PRN
  Administered 2014-04-19: 80 mg via INTRAVENOUS

## 2014-04-19 MED ORDER — GLYCOPYRROLATE 0.2 MG/ML IJ SOLN
INTRAMUSCULAR | Status: DC | PRN
  Administered 2014-04-19: 0.1 mg via INTRAVENOUS

## 2014-04-19 MED ORDER — SCOPOLAMINE 1 MG/3DAYS TD PT72
MEDICATED_PATCH | TRANSDERMAL | Status: AC
  Filled 2014-04-19: qty 1

## 2014-04-19 MED ORDER — FENTANYL CITRATE 0.05 MG/ML IJ SOLN: 25.0000 ug | INTRAMUSCULAR | Status: DC | PRN

## 2014-04-19 MED ORDER — CEFAZOLIN SODIUM-DEXTROSE 2-3 GM-% IV SOLR
2.0000 g | INTRAVENOUS | Status: AC
  Administered 2014-04-19: 2 g via INTRAVENOUS

## 2014-04-19 MED ORDER — IBUPROFEN 800 MG PO TABS
800.0000 mg | ORAL_TABLET | Freq: Three times a day (TID) | ORAL | Status: DC | PRN
  Administered 2014-04-19 – 2014-04-20 (×3): 800 mg via ORAL
  Filled 2014-04-19 (×3): qty 1

## 2014-04-19 MED ORDER — LOSARTAN POTASSIUM 25 MG PO TABS
25.0000 mg | ORAL_TABLET | Freq: Every day | ORAL | Status: DC
Start: 2014-04-19 — End: 2014-04-20
  Administered 2014-04-19: 25 mg via ORAL
  Filled 2014-04-19 (×2): qty 1

## 2014-04-19 MED ORDER — METHYLENE BLUE 1 % INJ SOLN
INTRAMUSCULAR | Status: AC
  Filled 2014-04-19: qty 1

## 2014-04-19 MED ORDER — MENTHOL 3 MG MT LOZG: 1.0000 | LOZENGE | OROMUCOSAL | Status: DC | PRN

## 2014-04-19 MED ORDER — LIDOCAINE-EPINEPHRINE (PF) 1 %-1:200000 IJ SOLN
INTRAMUSCULAR | Status: AC
  Filled 2014-04-19: qty 10

## 2014-04-19 MED ORDER — EPHEDRINE SULFATE 50 MG/ML IJ SOLN
INTRAMUSCULAR | Status: DC | PRN
  Administered 2014-04-19 (×3): 10 mg via INTRAVENOUS

## 2014-04-19 MED ORDER — FENTANYL CITRATE 0.05 MG/ML IJ SOLN
INTRAMUSCULAR | Status: DC | PRN
  Administered 2014-04-19: 50 ug via INTRAVENOUS
  Administered 2014-04-19: 100 ug via INTRAVENOUS
  Administered 2014-04-19: 50 ug via INTRAVENOUS
  Administered 2014-04-19: 100 ug via INTRAVENOUS
  Administered 2014-04-19: 50 ug via INTRAVENOUS

## 2014-04-19 MED ORDER — LIDOCAINE HCL (CARDIAC) 20 MG/ML IV SOLN
INTRAVENOUS | Status: AC
  Filled 2014-04-19: qty 5

## 2014-04-19 MED ORDER — FENTANYL CITRATE 0.05 MG/ML IJ SOLN
INTRAMUSCULAR | Status: AC
  Filled 2014-04-19: qty 5

## 2014-04-19 MED ORDER — LIDOCAINE HCL (CARDIAC) 20 MG/ML IV SOLN
INTRAVENOUS | Status: DC | PRN
  Administered 2014-04-19: 50 mg via INTRAVENOUS

## 2014-04-19 MED ORDER — MIDAZOLAM HCL 2 MG/2ML IJ SOLN: 0.5000 mg | Freq: Once | INTRAMUSCULAR | Status: DC | PRN

## 2014-04-19 MED ORDER — LIDOCAINE-EPINEPHRINE 0.5 %-1:200000 IJ SOLN
INTRAMUSCULAR | Status: AC
  Filled 2014-04-19: qty 1

## 2014-04-19 MED ORDER — OXYCODONE-ACETAMINOPHEN 5-325 MG PO TABS
1.0000 | ORAL_TABLET | ORAL | Status: DC | PRN
  Administered 2014-04-19: 2 via ORAL
  Filled 2014-04-19: qty 2

## 2014-04-19 MED ORDER — FENTANYL CITRATE 0.05 MG/ML IJ SOLN
INTRAMUSCULAR | Status: AC
  Filled 2014-04-19: qty 2

## 2014-04-19 MED ORDER — EPHEDRINE 5 MG/ML INJ
INTRAVENOUS | Status: AC
  Filled 2014-04-19: qty 10

## 2014-04-19 MED ORDER — ESTRADIOL 0.1 MG/GM VA CREA
TOPICAL_CREAM | VAGINAL | Status: DC | PRN
  Administered 2014-04-19: 1 via VAGINAL

## 2014-04-19 MED ORDER — SCOPOLAMINE 1 MG/3DAYS TD PT72
MEDICATED_PATCH | TRANSDERMAL | Status: DC | PRN
  Administered 2014-04-19: 1 via TRANSDERMAL

## 2014-04-19 MED ORDER — ONDANSETRON HCL 4 MG/2ML IJ SOLN
INTRAMUSCULAR | Status: DC | PRN
  Administered 2014-04-19: 4 mg via INTRAVENOUS

## 2014-04-19 MED ORDER — LIDOCAINE-EPINEPHRINE 0.5 %-1:200000 IJ SOLN
INTRAMUSCULAR | Status: DC | PRN
  Administered 2014-04-19: 8 mL
  Administered 2014-04-19: 10 mL

## 2014-04-19 MED ORDER — DIGOXIN 250 MCG PO TABS
0.3750 mg | ORAL_TABLET | ORAL | Status: DC
  Filled 2014-04-19: qty 1

## 2014-04-19 MED ORDER — PROMETHAZINE HCL 25 MG/ML IJ SOLN: 6.2500 mg | INTRAMUSCULAR | Status: DC | PRN

## 2014-04-19 MED ORDER — GLYCOPYRROLATE 0.2 MG/ML IJ SOLN
INTRAMUSCULAR | Status: AC
  Filled 2014-04-19: qty 1

## 2014-04-19 SURGICAL SUPPLY — 42 items
BLADE SURG 15 STRL LF C SS BP (BLADE) ×4 IMPLANT
BLADE SURG 15 STRL SS (BLADE) ×2
CANISTER SUCT 3000ML (MISCELLANEOUS) ×6 IMPLANT
CATH BONANNO SUPRAPUBIC 14G (CATHETERS) IMPLANT
CATH ROBINSON RED A/P 16FR (CATHETERS) ×6 IMPLANT
CLOTH BEACON ORANGE TIMEOUT ST (SAFETY) ×6 IMPLANT
CONT PATH 16OZ SNAP LID 3702 (MISCELLANEOUS) ×6 IMPLANT
DECANTER SPIKE VIAL GLASS SM (MISCELLANEOUS) IMPLANT
DRAPE HYSTEROSCOPY (DRAPE) ×6 IMPLANT
DRSG TELFA 3X8 NADH (GAUZE/BANDAGES/DRESSINGS) ×6 IMPLANT
FORMULA 20CAL 3 OZ MEAD (FORMULA) ×6 IMPLANT
GAUZE PACKING 1 X5 YD ST (GAUZE/BANDAGES/DRESSINGS) IMPLANT
GAUZE PACKING 2X5 YD STRL (GAUZE/BANDAGES/DRESSINGS) ×6 IMPLANT
GLOVE BIO SURGEON STRL SZ7 (GLOVE) ×6 IMPLANT
GLOVE BIOGEL PI IND STRL 6.5 (GLOVE) ×4 IMPLANT
GLOVE BIOGEL PI INDICATOR 6.5 (GLOVE) ×2
GOWN STRL REUS W/TWL LRG LVL3 (GOWN DISPOSABLE) ×24 IMPLANT
LIQUID BAND (GAUZE/BANDAGES/DRESSINGS) ×6 IMPLANT
NEEDLE HYPO 22GX1.5 SAFETY (NEEDLE) ×6 IMPLANT
NEEDLE SPNL 22GX3.5 QUINCKE BK (NEEDLE) IMPLANT
NS IRRIG 1000ML POUR BTL (IV SOLUTION) ×6 IMPLANT
PACK VAGINAL WOMENS (CUSTOM PROCEDURE TRAY) ×6 IMPLANT
PAD OB MATERNITY 4.3X12.25 (PERSONAL CARE ITEMS) ×6 IMPLANT
PLUG CATH AND CAP STER (CATHETERS) ×6 IMPLANT
SET CYSTO W/LG BORE CLAMP LF (SET/KITS/TRAYS/PACK) ×6 IMPLANT
SLING TRANS VAGINAL TAPE (Sling) ×2 IMPLANT
SLING UTERINE/ABD GYNECARE TVT (Sling) ×4 IMPLANT
SUT SILK 3 0 SH CR/8 (SUTURE) IMPLANT
SUT VIC AB 0 CT1 18XCR BRD8 (SUTURE) ×12 IMPLANT
SUT VIC AB 0 CT1 8-18 (SUTURE) ×6
SUT VIC AB 2-0 CT1 (SUTURE) ×12 IMPLANT
SUT VIC AB 2-0 CT1 27 (SUTURE) ×8
SUT VIC AB 2-0 CT1 TAPERPNT 27 (SUTURE) ×16 IMPLANT
SUT VIC AB 2-0 SH 27 (SUTURE) ×4
SUT VIC AB 2-0 SH 27XBRD (SUTURE) ×8 IMPLANT
SUT VIC AB 2-0 UR6 27 (SUTURE) IMPLANT
SUT VICRYL 0 TIES 12 18 (SUTURE) ×6 IMPLANT
SYR 50ML LL SCALE MARK (SYRINGE) IMPLANT
SYRINGE TOOMEY DISP (SYRINGE) ×6 IMPLANT
TOWEL OR 17X24 6PK STRL BLUE (TOWEL DISPOSABLE) ×12 IMPLANT
TRAY FOLEY CATH 14FR (SET/KITS/TRAYS/PACK) ×6 IMPLANT
WATER STERILE IRR 1000ML POUR (IV SOLUTION) ×6 IMPLANT

## 2014-04-19 NOTE — Addendum Note (Signed)
Addendum  created 04/19/14 1552 by Alvy Bimler, CRNA   Modules edited: Notes Section   Notes Section:  File: [id number]

## 2014-04-19 NOTE — Brief Op Note (Signed)
04/19/2014  10:26 AM  PATIENT:  Robin Castro  74 y.o. female  PRE-OPERATIVE DIAGNOSIS:  INCOMPLETE UTEROVAGINAL PROLAPSE, urethral hypemobility and large cystocele  POST-OPERATIVE DIAGNOSIS:  same  PROCEDURE:  Procedure(s): HYSTERECTOMY VAGINAL (N/A) TRANSVAGINAL TAPE (TVT) PROCEDURE (N/A) CYSTOSCOPY (N/A) ANTERIOR REPAIR (CYSTOCELE) (N/A) BILATERAL SALPINGECTOMY  SURGEON:  Surgeon(s) and Role:    * Robin Pastures, MD - Primary    * Robin Kenner, DO - Assisting    ANESTHESIA:   general  EBL:  Total I/O In: 2000 [I.V.:2000] Out: 400 [Urine:325; Blood:75]  LOCAL MEDICATIONS USED:  LIDOCAINE   SPECIMEN:  Source of Specimen:  uterus, cervix and tubes  DISPOSITION OF SPECIMEN:  PATHOLOGY  COUNTS:  YES  TOURNIQUET:  * No tourniquets in log *  DICTATION: .Note written in EPIC  PLAN OF CARE: Admit for overnight observation  PATIENT DISPOSITION:  PACU - hemodynamically stable.   Delay start of Pharmacological VTE agent (>24hrs) due to surgical blood loss or risk of bleeding: not applicable  Meds:  Similac in bladder, lidocaine with epi, estrace cream.  Findings: cystocele to 0; prolapse to 2 cm above hymenl; <5 week size uterus and normal ovaries.  Bladder intact after replacement of needle and bilateral ureteral spill seen from orifaces.  Complications:  First pass of abdominal needle on pt's left was through dome of bladder in and out.  Second pass was outside bladder.  Technique:  After general anesthesia was achieved, the patient was prepped and draped in a sterile fashion.  The bladder was emptied with a red rubber catheter and 60 cc of Similac was placed in the bladder. The cervix was grasped with a pair of Lahey clamps and injected circumferentially with 0.5% lidcaine with epi. A circumferential incision was made around the cervix with the scalpel at the level of the reflection of the vagina onto the cervix and the posterior cul-de-sac was entered into  with Mayo scissors. The long billed duckbill retractor was then placed and the bladder was removed off the cervix carefully with sharp dissection with the Metzenbaums. The the uterosacrals were grasped with a pair of heney clamps on either side and secured with a Heaney stitch of 0 Vicryl. Cardinal ligament was then divided with alternating successive bites of the Heaney clamp followed by incision with the Mayo scissors and secured with stitches of 0 Vicryl at the level of the cornua heneys were placed bilaterally around the entire pedicle and the uterus was able to be amputated.  The pedicles were secured with a free tie of 0 vicryl followed by a stitch of 0 vicryl. The ovaries were unable to be brought down within the field and they were palpated as normal and left in situ.  The tubes on each side were successively brought down with a babcock and removed with both cautery and a heney clamp with a stitch securing the pedicle. Once hemostasis was achieved the peritoneum was closed in a pursestring fashion including the bilateral uterosacrals and posteriorly in and out of the vagina and a partial Halbans culdoplasty.  The stitch was pulled taut closing the peritoneum and pulling the uterosacrals together through the modified Hall bands and through the vagina. The Foley was placed at this point.  The anterior vaginal mucosa was then entered into in the midline with the help of Alice's after being injected with 0.5% lidocaine with epi with the scalpel. The vaginal mucosa was then reflected off of the vesicouterine fascia with careful sharp dissection with the Metzenbaums until  the pelvic floor could feel be felt underneath the pubic bone. Two small stab incisions are made on 2 cm either side of the midline just above the pubic bone. The abdominal needles were placed carefully through the pelvic floor and out the vagina. The Foley was removed and the cystoscope placed inside the bladder; on the first pass the left  needle pierce through the dome of the bladder.  The cystoscope was removed, the foley replaced and the L abdominal needle carefully replaced.  Ont the second inspection with the cystoscope, there were no needles in the bladder, there was good spillage from the bilateral ureteral orifices and the trigone and urethra were clear.  The foley was replaced. The abdominal needles were attached the vaginal needles and the tape pulled through and cut into place. A Robin Castro was used to ensure that the there was no tension placed on the tape. Once I was satisfied that the sling was in the correct place and at no tension, the anterior repair was done with three mattress stitches of 0 Vicryl. Hemostasis was achieved. The vaginal mucosa was trimmed and closed with a running locked stitch of 2-0 Vicryl. The cuff was then closed with interrupted figure-of-eight stitches of 0 Vicryl. A vaginal pack was placed inside the vagina with Estrace cream and the patient tolerated the procedure was returned to recovery in stable condition.   Robin Castro A

## 2014-04-19 NOTE — Op Note (Signed)
04/19/2014  10:26 AM  PATIENT:  Robin Castro  74 y.o. female  PRE-OPERATIVE DIAGNOSIS:  INCOMPLETE UTEROVAGINAL PROLAPSE, urethral hypemobility and large cystocele  POST-OPERATIVE DIAGNOSIS:  same  PROCEDURE:  Procedure(s): HYSTERECTOMY VAGINAL (N/A) TRANSVAGINAL TAPE (TVT) PROCEDURE (N/A) CYSTOSCOPY (N/A) ANTERIOR REPAIR (CYSTOCELE) (N/A) BILATERAL SALPINGECTOMY  SURGEON:  Surgeon(s) and Role:    * Daria Pastures, MD - Primary    * Allyn Kenner, DO - Assisting    ANESTHESIA:   general  EBL:  Total I/O In: 2000 [I.V.:2000] Out: 400 [Urine:325; Blood:75]  LOCAL MEDICATIONS USED:  LIDOCAINE   SPECIMEN:  Source of Specimen:  uterus, cervix and tubes  DISPOSITION OF SPECIMEN:  PATHOLOGY  COUNTS:  YES  TOURNIQUET:  * No tourniquets in log *  DICTATION: .Note written in EPIC  PLAN OF CARE: Admit for overnight observation  PATIENT DISPOSITION:  PACU - hemodynamically stable.   Delay start of Pharmacological VTE agent (>24hrs) due to surgical blood loss or risk of bleeding: not applicable  Meds:  Similac in bladder, lidocaine with epi, estrace cream.  Findings: cystocele to 0; prolapse to 2 cm above hymenl; <5 week size uterus and normal ovaries.  Bladder intact after replacement of needle and bilateral ureteral spill seen from orifaces.  Complications:  First pass of abdominal needle on pt's left was through dome of bladder in and out.  Second pass was outside bladder.  Technique:  After general anesthesia was achieved, the patient was prepped and draped in a sterile fashion.  The bladder was emptied with a red rubber catheter and 60 cc of Similac was placed in the bladder. The cervix was grasped with a pair of Lahey clamps and injected circumferentially with 0.5% lidcaine with epi. A circumferential incision was made around the cervix with the scalpel at the level of the reflection of the vagina onto the cervix and the posterior cul-de-sac was entered into  with Mayo scissors. The long billed duckbill retractor was then placed and the bladder was removed off the cervix carefully with sharp dissection with the Metzenbaums. The the uterosacrals were grasped with a pair of heney clamps on either side and secured with a Heaney stitch of 0 Vicryl. Cardinal ligament was then divided with alternating successive bites of the Heaney clamp followed by incision with the Mayo scissors and secured with stitches of 0 Vicryl at the level of the cornua heneys were placed bilaterally around the entire pedicle and the uterus was able to be amputated.  The pedicles were secured with a free tie of 0 vicryl followed by a stitch of 0 vicryl. The ovaries were unable to be brought down within the field and they were palpated as normal and left in situ.  The tubes on each side were successively brought down with a babcock and removed with both cautery and a heney clamp with a stitch securing the pedicle. Once hemostasis was achieved the peritoneum was closed in a pursestring fashion including the bilateral uterosacrals and posteriorly in and out of the vagina and a partial Halbans culdoplasty.  The stitch was pulled taut closing the peritoneum and pulling the uterosacrals together through the modified Hall bands and through the vagina. The Foley was placed at this point.  The anterior vaginal mucosa was then entered into in the midline with the help of Alice's after being injected with 0.5% lidocaine with epi with the scalpel. The vaginal mucosa was then reflected off of the vesicouterine fascia with careful sharp dissection with the Metzenbaums until  the pelvic floor could feel be felt underneath the pubic bone. Two small stab incisions are made on 2 cm either side of the midline just above the pubic bone. The abdominal needles were placed carefully through the pelvic floor and out the vagina. The Foley was removed and the cystoscope placed inside the bladder; on the first pass the left  needle pierce through the dome of the bladder.  The cystoscope was removed, the foley replaced and the L abdominal needle carefully replaced.  On the second inspection with the cystoscope, there were no needles in the bladder, there was good spillage from the bilateral ureteral orifices and the trigone and urethra were clear.  The foley was replaced. The abdominal needles were attached the vaginal needles and the tape pulled through and cut into place. A Claiborne Billings was used to ensure that the there was no tension placed on the tape. Once I was satisfied that the sling was in the correct place and at no tension, the anterior repair was done with three mattress stitches of 0 Vicryl. Hemostasis was achieved. The vaginal mucosa was trimmed and closed with a running locked stitch of 2-0 Vicryl. The cuff was then closed with interrupted figure-of-eight stitches of 0 Vicryl. A vaginal pack was placed inside the vagina with Estrace cream and the patient tolerated the procedure was returned to recovery in stable condition.   The pt will be informed that she will need to have the urethral catheter for one week before voiding trial.   Olando Willems A

## 2014-04-19 NOTE — Transfer of Care (Signed)
Immediate Anesthesia Transfer of Care Note  Patient: Robin Castro  Procedure(s) Performed: Procedure(s): HYSTERECTOMY VAGINAL (N/A) TRANSVAGINAL TAPE (TVT) PROCEDURE (N/A) CYSTOSCOPY (N/A) ANTERIOR REPAIR (CYSTOCELE) (N/A) BILATERAL SALPINGECTOMY  Patient Location: PACU  Anesthesia Type:General  Level of Consciousness: awake, alert  and oriented  Airway & Oxygen Therapy: Patient Spontanous Breathing and Patient connected to nasal cannula oxygen  Post-op Assessment: Report given to PACU RN and Post -op Vital signs reviewed and stable  Post vital signs: Reviewed and stable  Complications: No apparent anesthesia complications

## 2014-04-19 NOTE — Progress Notes (Signed)
There has been no change in the patients history, status or exam since the history and physical.  Filed Vitals:   04/19/14 0705  BP: 111/48  Pulse: 68  Temp: 98.2 F (36.8 C)  TempSrc: Oral  Resp: 16  SpO2: 100%    Lab Results  Component Value Date   WBC 6.9 04/13/2014   HGB 13.3 04/13/2014   HCT 40.8 04/13/2014   MCV 90.3 04/13/2014   PLT 304 04/13/2014    Seldon Barrell A

## 2014-04-19 NOTE — Anesthesia Preprocedure Evaluation (Addendum)
Anesthesia Evaluation  Patient identified by MRN, date of birth, ID band Patient awake    Reviewed: Allergy & Precautions, H&P , NPO status , Patient's Chart, lab work & pertinent test results  History of Anesthesia Complications (+) PONV  Airway Mallampati: II       Dental   Pulmonary  breath sounds clear to auscultation        Cardiovascular Exercise Tolerance: Good + dysrhythmias Rhythm:regular Rate:Normal     Neuro/Psych    GI/Hepatic GERD-  Controlled,  Endo/Other  diabetes  Renal/GU      Musculoskeletal   Abdominal   Peds  Hematology   Anesthesia Other Findings   Reproductive/Obstetrics (+) Pregnancy                          Anesthesia Physical Anesthesia Plan  ASA: III  Anesthesia Plan: General ETT   Post-op Pain Management:    Induction:   Airway Management Planned:   Additional Equipment:   Intra-op Plan:   Post-operative Plan:   Informed Consent: I have reviewed the patients History and Physical, chart, labs and discussed the procedure including the risks, benefits and alternatives for the proposed anesthesia with the patient or authorized representative who has indicated his/her understanding and acceptance.     Plan Discussed with: Anesthesiologist, CRNA and Surgeon  Anesthesia Plan Comments:       Anesthesia Quick Evaluation On lanoxin for rapid heart beat.  Took it this morning.  Patient prefers general to neuraxial anesthesia

## 2014-04-19 NOTE — Anesthesia Procedure Notes (Signed)
Procedure Name: Intubation Date/Time: 04/19/2014 8:34 AM Performed by: Jonna Munro Pre-anesthesia Checklist: Patient identified, Emergency Drugs available, Suction available, Patient being monitored and Timeout performed Patient Re-evaluated:Patient Re-evaluated prior to inductionOxygen Delivery Method: Circle system utilized Preoxygenation: Pre-oxygenation with 100% oxygen Intubation Type: IV induction Laryngoscope Size: Mac and 3 Grade View: Grade I Tube type: Oral Tube size: 7.0 mm Number of attempts: 1 Airway Equipment and Method: Stylet Placement Confirmation: ETT inserted through vocal cords under direct vision,  positive ETCO2 and breath sounds checked- equal and bilateral Secured at: 22 cm Tube secured with: Tape Dental Injury: Teeth and Oropharynx as per pre-operative assessment

## 2014-04-19 NOTE — Anesthesia Postprocedure Evaluation (Signed)
  Anesthesia Post-op Note  Patient: Robin Castro  Procedure(s) Performed: Procedure(s): HYSTERECTOMY VAGINAL (N/A) TRANSVAGINAL TAPE (TVT) PROCEDURE (N/A) CYSTOSCOPY (N/A) ANTERIOR REPAIR (CYSTOCELE) (N/A) BILATERAL SALPINGECTOMY Patient is awake and responsive. Pain and nausea are reasonably well controlled. Vital signs are stable and clinically acceptable. Oxygen saturation is clinically acceptable. There are no apparent anesthetic complications at this time. Patient is ready for discharge.

## 2014-04-19 NOTE — Anesthesia Postprocedure Evaluation (Signed)
  Anesthesia Post-op Note  Patient: Robin Castro  Procedure(s) Performed: Procedure(s): HYSTERECTOMY VAGINAL (N/A) TRANSVAGINAL TAPE (TVT) PROCEDURE (N/A) CYSTOSCOPY (N/A) ANTERIOR REPAIR (CYSTOCELE) (N/A) BILATERAL SALPINGECTOMY  Patient Location: [facility]  Anesthesia Type:General  Level of Consciousness: awake, alert , oriented and patient cooperative  Airway and Oxygen Therapy: Patient Spontanous Breathing and Patient connected to nasal cannula oxygen  Post-op Pain: none  Post-op Assessment: Post-op Vital signs reviewed, Patient's Cardiovascular Status Stable, Respiratory Function Stable, Patent Airway, No signs of Nausea or vomiting, Adequate PO intake, Pain level controlled, No headache, No backache, No residual numbness and No residual motor weakness  Post-op Vital Signs: Reviewed  Last Vitals:  Filed Vitals:   04/19/14 1315  BP: 129/60  Pulse: 70  Temp: 36.4 C  Resp: 16    Complications: No apparent anesthesia complications

## 2014-04-20 ENCOUNTER — Encounter (HOSPITAL_COMMUNITY): Payer: Self-pay | Admitting: Obstetrics and Gynecology

## 2014-04-20 DIAGNOSIS — N812 Incomplete uterovaginal prolapse: Secondary | ICD-10-CM | POA: Diagnosis not present

## 2014-04-20 LAB — COMPREHENSIVE METABOLIC PANEL
ALK PHOS: 49 U/L (ref 39–117)
ALT: 15 U/L (ref 0–35)
AST: 18 U/L (ref 0–37)
Albumin: 3.1 g/dL — ABNORMAL LOW (ref 3.5–5.2)
Anion gap: 9 (ref 5–15)
BILIRUBIN TOTAL: 0.5 mg/dL (ref 0.3–1.2)
BUN: 7 mg/dL (ref 6–23)
CALCIUM: 8.7 mg/dL (ref 8.4–10.5)
CO2: 30 mmol/L (ref 19–32)
CREATININE: 0.52 mg/dL (ref 0.50–1.10)
Chloride: 102 mEq/L (ref 96–112)
GFR calc Af Amer: >90 mL/min (ref >90)
GFR calc non Af Amer: >90 mL/min (ref >90)
GLUCOSE: 126 mg/dL — AB (ref 70–99)
POTASSIUM: 4.2 mmol/L (ref 3.5–5.1)
Sodium: 141 mmol/L (ref 135–145)
TOTAL PROTEIN: 5.6 g/dL — AB (ref 6.0–8.3)

## 2014-04-20 LAB — GLUCOSE, CAPILLARY: Glucose-Capillary: 120 mg/dL — ABNORMAL HIGH (ref 70–99)

## 2014-04-20 MED ORDER — CEFUROXIME AXETIL 250 MG PO TABS: 250.0000 mg | ORAL_TABLET | Freq: Two times a day (BID) | ORAL | Status: DC

## 2014-04-20 MED ORDER — OXYCODONE-ACETAMINOPHEN 5-325 MG PO TABS: 1.0000 | ORAL_TABLET | ORAL | Status: DC | PRN

## 2014-04-20 NOTE — Discharge Summary (Signed)
Physician Discharge Summary  Patient ID: Robin Castro MRN: [id number] DOB/AGE: 1940/07/20 74 y.o.  Admit date: 04/19/2014 Discharge date: 04/20/2014  Admission Diagnoses:Prolapse, cystocele, SUI  Discharge Diagnoses: postop, same Active Problems:   Postoperative state   Discharged Condition: good  Hospital Course: Uncomplicated surgery - TVH/B salpingectomies/TVT/A repair/Cysto -and post op course normal.  Consults: None  Significant Diagnostic Studies: labs:   Recent Results (from the past 2160 hour(s))  CBC     Status: None   Collection Time: 04/13/14 10:25 AM  Result Value Ref Range   WBC 6.9 4.0 - 10.5 K/uL   RBC 4.52 3.87 - 5.11 MIL/uL   Hemoglobin 13.3 12.0 - 15.0 g/dL   HCT 40.8 36.0 - 46.0 %   MCV 90.3 78.0 - 100.0 fL   MCH 29.4 26.0 - 34.0 pg   MCHC 32.6 30.0 - 36.0 g/dL   RDW 13.2 11.5 - 15.5 %   Platelets 304 150 - 400 K/uL  Basic metabolic panel     Status: Abnormal   Collection Time: 04/13/14 10:25 AM  Result Value Ref Range   Sodium 139 135 - 145 mmol/L    Comment: Please note change in reference range.   Potassium 3.9 3.5 - 5.1 mmol/L    Comment: Please note change in reference range.   Chloride 103 96 - 112 mEq/L   CO2 29 19 - 32 mmol/L   Glucose, Bld 221 (H) 70 - 99 mg/dL   BUN 9 6 - 23 mg/dL   Creatinine, Ser 0.50 0.50 - 1.10 mg/dL   Calcium 9.0 8.4 - 10.5 mg/dL   GFR calc non Af Amer >90 >90 mL/min   GFR calc Af Amer >90 >90 mL/min    Comment: (NOTE) The eGFR has been calculated using the CKD EPI equation. This calculation has not been validated in all clinical situations. eGFR's persistently <90 mL/min signify possible Chronic Kidney Disease.    Anion gap 7 5 - 15  Glucose, capillary     Status: Abnormal   Collection Time: 04/19/14  7:24 AM  Result Value Ref Range   Glucose-Capillary 118 (H) 70 - 99 mg/dL   Comment 1 Documented in Chart    Comment 2 Notify RN   Glucose, capillary     Status: Abnormal   Collection Time: 04/19/14  11:05 AM  Result Value Ref Range   Glucose-Capillary 163 (H) 70 - 99 mg/dL  Glucose, capillary     Status: Abnormal   Collection Time: 04/19/14  4:48 PM  Result Value Ref Range   Glucose-Capillary 148 (H) 70 - 99 mg/dL  Comprehensive metabolic panel     Status: Abnormal   Collection Time: 04/20/14  5:30 AM  Result Value Ref Range   Sodium 141 135 - 145 mmol/L    Comment: Please note change in reference range.   Potassium 4.2 3.5 - 5.1 mmol/L    Comment: Please note change in reference range.   Chloride 102 96 - 112 mEq/L   CO2 30 19 - 32 mmol/L   Glucose, Bld 126 (H) 70 - 99 mg/dL   BUN 7 6 - 23 mg/dL   Creatinine, Ser 0.52 0.50 - 1.10 mg/dL   Calcium 8.7 8.4 - 10.5 mg/dL   Total Protein 5.6 (L) 6.0 - 8.3 g/dL   Albumin 3.1 (L) 3.5 - 5.2 g/dL   AST 18 0 - 37 U/L   ALT 15 0 - 35 U/L   Alkaline Phosphatase 49 39 - 117 U/L  Total Bilirubin 0.5 0.3 - 1.2 mg/dL   GFR calc non Af Amer >90 >90 mL/min   GFR calc Af Amer >90 >90 mL/min    Comment: (NOTE) The eGFR has been calculated using the CKD EPI equation. This calculation has not been validated in all clinical situations. eGFR's persistently <90 mL/min signify possible Chronic Kidney Disease.    Anion gap 9 5 - 15  Glucose, capillary     Status: Abnormal   Collection Time: 04/20/14  6:32 AM  Result Value Ref Range   Glucose-Capillary 120 (H) 70 - 99 mg/dL    Treatments: surgery: TVH/B salpingectomies/TVT/A repair/Cysto   Discharge Exam: Blood pressure 98/45, pulse 63, temperature 98.4 F (36.9 C), temperature source Oral, resp. rate 18, height 5' 5"  (1.651 m), weight 61.236 kg (135 lb), SpO2 95 %.   Disposition:   Discharge Instructions    Call MD for:  temperature >100.4    Complete by:  As directed      Diet - low sodium heart healthy    Complete by:  As directed      Discharge instructions    Complete by:  As directed   No driving on narcotics, no sexual activity for 2 weeks.     Increase activity slowly     Complete by:  As directed      May shower / Bathe    Complete by:  As directed   Shower, no bath for 2 weeks.     Remove dressing in 24 hours    Complete by:  As directed      Sexual Activity Restrictions    Complete by:  As directed   No sexual activity for 2 weeks.            Medication List    STOP taking these medications        rosuvastatin 5 MG tablet  Commonly known as:  CRESTOR      TAKE these medications        aspirin 81 MG tablet  Take 81 mg by mouth daily.     CALCIUM 600 + D PO  Take 1 tablet by mouth 2 (two) times daily.     cefUROXime 250 MG tablet  Commonly known as:  CEFTIN  Take 1 tablet (250 mg total) by mouth 2 (two) times daily with a meal.     digoxin 0.25 MG tablet  Commonly known as:  LANOXIN  1 tab daily alternating with 1 1/2 tab daily     ergocalciferol 50000 UNITS capsule  Commonly known as:  VITAMIN D2  Take 50,000 Units by mouth every 14 (fourteen) days.     linagliptin 5 MG Tabs tablet  Commonly known as:  TRADJENTA  Take 5 mg by mouth daily.     losartan 25 MG tablet  Commonly known as:  COZAAR  Take 25 mg by mouth daily.     LOVAZA 1 G capsule  Generic drug:  omega-3 acid ethyl esters  Take 2 capsules by mouth Twice daily.     metFORMIN 1000 MG tablet  Commonly known as:  GLUCOPHAGE  Take 1,000 mg by mouth 2 (two) times daily with a meal.     omeprazole 20 MG capsule  Commonly known as:  PRILOSEC  Take 20 mg by mouth as needed.     oxyCODONE-acetaminophen 5-325 MG per tablet  Commonly known as:  PERCOCET/ROXICET  Take 1-2 tablets by mouth every 4 (four) hours as needed for severe pain (  moderate to severe pain (when tolerating fluids)).           Follow-up Information    Follow up with Krislynn Gronau A, MD In 1 week.   Specialty:  Obstetrics and Gynecology   Why:  see instructions- voiding trial   Contact information:   [address] [place] [address] [phone]        Signed: Lillyrose Reitan A 04/20/2014, 7:45 AM

## 2014-04-20 NOTE — Progress Notes (Signed)
Vag pack placed in OR- not on op note but was done.   Removed this am without comp.

## 2014-04-20 NOTE — Progress Notes (Signed)
Pt discharged home with daughter... Discharge instructions reviewed with pt and she verbalized understanding... Condition stable... No equipment... Taken to car via wheelchair by A. Lysbeth Galas, [place].

## 2014-04-20 NOTE — Discharge Instructions (Signed)
Keep Foley in place- give patient leg bag and show how to change bags.  Instruct on care. Voiding trial at office- call and come in at 830 am on next Wednesday- 1 week postop.

## 2014-04-20 NOTE — Progress Notes (Signed)
Vaginal packing removed by Dr. Philis Pique

## 2014-04-20 NOTE — Progress Notes (Signed)
Patient is eating, ambulating, and has good urine output with foley in.  Pain control is good.  BP 98/45 mmHg  Pulse 63  Temp(Src) 98.4 F (36.9 C) (Oral)  Resp 18  Ht 5\' 5"  (1.651 m)  Wt 61.236 kg (135 lb)  BMI 22.47 kg/m2  SpO2 95%  lungs:   clear to auscultation cor:    RRR Abdomen:  soft, appropriate tenderness, incisions intact and without erythema or exudate. ex:    no cords   Results for orders placed or performed during the hospital encounter of 04/19/14 (from the past 24 hour(s))  Glucose, capillary     Status: Abnormal   Collection Time: 04/19/14 11:05 AM  Result Value Ref Range   Glucose-Capillary 163 (H) 70 - 99 mg/dL  Glucose, capillary     Status: Abnormal   Collection Time: 04/19/14  4:48 PM  Result Value Ref Range   Glucose-Capillary 148 (H) 70 - 99 mg/dL  Comprehensive metabolic panel     Status: Abnormal   Collection Time: 04/20/14  5:30 AM  Result Value Ref Range   Sodium 141 135 - 145 mmol/L   Potassium 4.2 3.5 - 5.1 mmol/L   Chloride 102 96 - 112 mEq/L   CO2 30 19 - 32 mmol/L   Glucose, Bld 126 (H) 70 - 99 mg/dL   BUN 7 6 - 23 mg/dL   Creatinine, Ser 0.52 0.50 - 1.10 mg/dL   Calcium 8.7 8.4 - 10.5 mg/dL   Total Protein 5.6 (L) 6.0 - 8.3 g/dL   Albumin 3.1 (L) 3.5 - 5.2 g/dL   AST 18 0 - 37 U/L   ALT 15 0 - 35 U/L   Alkaline Phosphatase 49 39 - 117 U/L   Total Bilirubin 0.5 0.3 - 1.2 mg/dL   GFR calc non Af Amer >90 >90 mL/min   GFR calc Af Amer >90 >90 mL/min   Anion gap 9 5 - 15  Glucose, capillary     Status: Abnormal   Collection Time: 04/20/14  6:32 AM  Result Value Ref Range   Glucose-Capillary 120 (H) 70 - 99 mg/dL    A/P  Routine care.  Expect d/c per plan.

## 2015-06-12 DIAGNOSIS — E1129 Type 2 diabetes mellitus with other diabetic kidney complication: Secondary | ICD-10-CM | POA: Diagnosis not present

## 2015-06-12 DIAGNOSIS — E7800 Pure hypercholesterolemia, unspecified: Secondary | ICD-10-CM | POA: Diagnosis not present

## 2015-06-12 DIAGNOSIS — I482 Chronic atrial fibrillation: Secondary | ICD-10-CM | POA: Diagnosis not present

## 2015-06-22 DIAGNOSIS — J329 Chronic sinusitis, unspecified: Secondary | ICD-10-CM | POA: Diagnosis not present

## 2015-06-22 DIAGNOSIS — R52 Pain, unspecified: Secondary | ICD-10-CM | POA: Diagnosis not present

## 2015-07-30 DIAGNOSIS — Z800 Family history of malignant neoplasm of digestive organs: Secondary | ICD-10-CM | POA: Diagnosis not present

## 2015-07-30 DIAGNOSIS — K5730 Diverticulosis of large intestine without perforation or abscess without bleeding: Secondary | ICD-10-CM | POA: Diagnosis not present

## 2015-07-30 DIAGNOSIS — Z86010 Personal history of colonic polyps: Secondary | ICD-10-CM | POA: Diagnosis not present

## 2015-08-03 DIAGNOSIS — J189 Pneumonia, unspecified organism: Secondary | ICD-10-CM | POA: Diagnosis not present

## 2015-08-03 DIAGNOSIS — R05 Cough: Secondary | ICD-10-CM | POA: Diagnosis not present

## 2015-08-30 DIAGNOSIS — R5383 Other fatigue: Secondary | ICD-10-CM | POA: Diagnosis not present

## 2015-08-30 DIAGNOSIS — E785 Hyperlipidemia, unspecified: Secondary | ICD-10-CM | POA: Diagnosis not present

## 2015-08-30 DIAGNOSIS — R5381 Other malaise: Secondary | ICD-10-CM | POA: Diagnosis not present

## 2015-08-30 DIAGNOSIS — J189 Pneumonia, unspecified organism: Secondary | ICD-10-CM | POA: Diagnosis not present

## 2015-08-30 DIAGNOSIS — Z0000 Encounter for general adult medical examination without abnormal findings: Secondary | ICD-10-CM | POA: Diagnosis not present

## 2015-08-30 DIAGNOSIS — E1129 Type 2 diabetes mellitus with other diabetic kidney complication: Secondary | ICD-10-CM | POA: Diagnosis not present

## 2015-10-31 DIAGNOSIS — Z9181 History of falling: Secondary | ICD-10-CM | POA: Diagnosis not present

## 2015-10-31 DIAGNOSIS — Z1389 Encounter for screening for other disorder: Secondary | ICD-10-CM | POA: Diagnosis not present

## 2015-10-31 DIAGNOSIS — F419 Anxiety disorder, unspecified: Secondary | ICD-10-CM | POA: Diagnosis not present

## 2015-10-31 DIAGNOSIS — E1129 Type 2 diabetes mellitus with other diabetic kidney complication: Secondary | ICD-10-CM | POA: Diagnosis not present

## 2015-10-31 DIAGNOSIS — E7800 Pure hypercholesterolemia, unspecified: Secondary | ICD-10-CM | POA: Diagnosis not present

## 2015-12-06 DIAGNOSIS — E119 Type 2 diabetes mellitus without complications: Secondary | ICD-10-CM | POA: Diagnosis not present

## 2015-12-06 DIAGNOSIS — H2513 Age-related nuclear cataract, bilateral: Secondary | ICD-10-CM | POA: Diagnosis not present

## 2016-01-24 DIAGNOSIS — H6123 Impacted cerumen, bilateral: Secondary | ICD-10-CM | POA: Diagnosis not present

## 2016-02-08 DIAGNOSIS — Z23 Encounter for immunization: Secondary | ICD-10-CM | POA: Diagnosis not present

## 2016-02-18 DIAGNOSIS — E7800 Pure hypercholesterolemia, unspecified: Secondary | ICD-10-CM | POA: Diagnosis not present

## 2016-02-18 DIAGNOSIS — E1129 Type 2 diabetes mellitus with other diabetic kidney complication: Secondary | ICD-10-CM | POA: Diagnosis not present

## 2016-02-20 DIAGNOSIS — L820 Inflamed seborrheic keratosis: Secondary | ICD-10-CM | POA: Diagnosis not present

## 2016-02-20 DIAGNOSIS — C44311 Basal cell carcinoma of skin of nose: Secondary | ICD-10-CM | POA: Diagnosis not present

## 2016-02-20 DIAGNOSIS — L578 Other skin changes due to chronic exposure to nonionizing radiation: Secondary | ICD-10-CM | POA: Diagnosis not present

## 2016-02-20 DIAGNOSIS — L814 Other melanin hyperpigmentation: Secondary | ICD-10-CM | POA: Diagnosis not present

## 2016-03-10 DIAGNOSIS — Z01419 Encounter for gynecological examination (general) (routine) without abnormal findings: Secondary | ICD-10-CM | POA: Diagnosis not present

## 2016-04-09 DIAGNOSIS — C44311 Basal cell carcinoma of skin of nose: Secondary | ICD-10-CM | POA: Diagnosis not present

## 2016-05-08 DIAGNOSIS — Z96652 Presence of left artificial knee joint: Secondary | ICD-10-CM | POA: Diagnosis not present

## 2016-05-08 DIAGNOSIS — M4186 Other forms of scoliosis, lumbar region: Secondary | ICD-10-CM | POA: Diagnosis not present

## 2016-05-08 DIAGNOSIS — M5136 Other intervertebral disc degeneration, lumbar region: Secondary | ICD-10-CM | POA: Diagnosis not present

## 2016-05-08 DIAGNOSIS — M4687 Other specified inflammatory spondylopathies, lumbosacral region: Secondary | ICD-10-CM | POA: Diagnosis not present

## 2016-05-08 DIAGNOSIS — M5137 Other intervertebral disc degeneration, lumbosacral region: Secondary | ICD-10-CM | POA: Diagnosis not present

## 2016-05-08 DIAGNOSIS — M5432 Sciatica, left side: Secondary | ICD-10-CM | POA: Insufficient documentation

## 2016-05-08 DIAGNOSIS — G8929 Other chronic pain: Secondary | ICD-10-CM | POA: Diagnosis not present

## 2016-05-08 DIAGNOSIS — M160 Bilateral primary osteoarthritis of hip: Secondary | ICD-10-CM | POA: Diagnosis not present

## 2016-05-08 HISTORY — DX: Sciatica, left side: M54.32

## 2016-05-09 ENCOUNTER — Other Ambulatory Visit: Payer: Self-pay | Admitting: Obstetrics and Gynecology

## 2016-05-09 DIAGNOSIS — Z01419 Encounter for gynecological examination (general) (routine) without abnormal findings: Secondary | ICD-10-CM | POA: Diagnosis not present

## 2016-05-09 DIAGNOSIS — Z1231 Encounter for screening mammogram for malignant neoplasm of breast: Secondary | ICD-10-CM | POA: Diagnosis not present

## 2016-05-09 DIAGNOSIS — Z1272 Encounter for screening for malignant neoplasm of vagina: Secondary | ICD-10-CM | POA: Diagnosis not present

## 2016-05-12 LAB — CYTOLOGY - PAP

## 2016-05-16 DIAGNOSIS — M6281 Muscle weakness (generalized): Secondary | ICD-10-CM | POA: Diagnosis not present

## 2016-05-16 DIAGNOSIS — M25551 Pain in right hip: Secondary | ICD-10-CM | POA: Diagnosis not present

## 2016-05-16 DIAGNOSIS — R2689 Other abnormalities of gait and mobility: Secondary | ICD-10-CM | POA: Diagnosis not present

## 2016-05-16 DIAGNOSIS — M545 Low back pain: Secondary | ICD-10-CM | POA: Diagnosis not present

## 2016-05-20 DIAGNOSIS — M25551 Pain in right hip: Secondary | ICD-10-CM | POA: Diagnosis not present

## 2016-05-20 DIAGNOSIS — M6281 Muscle weakness (generalized): Secondary | ICD-10-CM | POA: Diagnosis not present

## 2016-05-20 DIAGNOSIS — R2689 Other abnormalities of gait and mobility: Secondary | ICD-10-CM | POA: Diagnosis not present

## 2016-05-20 DIAGNOSIS — M545 Low back pain: Secondary | ICD-10-CM | POA: Diagnosis not present

## 2016-05-22 DIAGNOSIS — M25551 Pain in right hip: Secondary | ICD-10-CM | POA: Diagnosis not present

## 2016-05-22 DIAGNOSIS — M6281 Muscle weakness (generalized): Secondary | ICD-10-CM | POA: Diagnosis not present

## 2016-05-22 DIAGNOSIS — M545 Low back pain: Secondary | ICD-10-CM | POA: Diagnosis not present

## 2016-05-22 DIAGNOSIS — R2689 Other abnormalities of gait and mobility: Secondary | ICD-10-CM | POA: Diagnosis not present

## 2016-05-27 DIAGNOSIS — M6281 Muscle weakness (generalized): Secondary | ICD-10-CM | POA: Diagnosis not present

## 2016-05-27 DIAGNOSIS — R2689 Other abnormalities of gait and mobility: Secondary | ICD-10-CM | POA: Diagnosis not present

## 2016-05-27 DIAGNOSIS — M545 Low back pain: Secondary | ICD-10-CM | POA: Diagnosis not present

## 2016-05-27 DIAGNOSIS — M25551 Pain in right hip: Secondary | ICD-10-CM | POA: Diagnosis not present

## 2016-05-30 DIAGNOSIS — M545 Low back pain: Secondary | ICD-10-CM | POA: Diagnosis not present

## 2016-05-30 DIAGNOSIS — M25551 Pain in right hip: Secondary | ICD-10-CM | POA: Diagnosis not present

## 2016-05-30 DIAGNOSIS — M6281 Muscle weakness (generalized): Secondary | ICD-10-CM | POA: Diagnosis not present

## 2016-05-30 DIAGNOSIS — R2689 Other abnormalities of gait and mobility: Secondary | ICD-10-CM | POA: Diagnosis not present

## 2016-06-03 DIAGNOSIS — R2689 Other abnormalities of gait and mobility: Secondary | ICD-10-CM | POA: Diagnosis not present

## 2016-06-03 DIAGNOSIS — M545 Low back pain: Secondary | ICD-10-CM | POA: Diagnosis not present

## 2016-06-03 DIAGNOSIS — M6281 Muscle weakness (generalized): Secondary | ICD-10-CM | POA: Diagnosis not present

## 2016-06-03 DIAGNOSIS — M25551 Pain in right hip: Secondary | ICD-10-CM | POA: Diagnosis not present

## 2016-06-05 DIAGNOSIS — R2689 Other abnormalities of gait and mobility: Secondary | ICD-10-CM | POA: Diagnosis not present

## 2016-06-05 DIAGNOSIS — M6281 Muscle weakness (generalized): Secondary | ICD-10-CM | POA: Diagnosis not present

## 2016-06-05 DIAGNOSIS — M545 Low back pain: Secondary | ICD-10-CM | POA: Diagnosis not present

## 2016-06-05 DIAGNOSIS — M25551 Pain in right hip: Secondary | ICD-10-CM | POA: Diagnosis not present

## 2016-06-09 DIAGNOSIS — M545 Low back pain: Secondary | ICD-10-CM | POA: Diagnosis not present

## 2016-06-09 DIAGNOSIS — M25551 Pain in right hip: Secondary | ICD-10-CM | POA: Diagnosis not present

## 2016-06-09 DIAGNOSIS — M6281 Muscle weakness (generalized): Secondary | ICD-10-CM | POA: Diagnosis not present

## 2016-06-09 DIAGNOSIS — R2689 Other abnormalities of gait and mobility: Secondary | ICD-10-CM | POA: Diagnosis not present

## 2016-06-26 DIAGNOSIS — E1129 Type 2 diabetes mellitus with other diabetic kidney complication: Secondary | ICD-10-CM | POA: Diagnosis not present

## 2017-01-15 DIAGNOSIS — Z23 Encounter for immunization: Secondary | ICD-10-CM | POA: Diagnosis not present

## 2017-03-23 DIAGNOSIS — Z1339 Encounter for screening examination for other mental health and behavioral disorders: Secondary | ICD-10-CM | POA: Diagnosis not present

## 2017-03-23 DIAGNOSIS — Z1331 Encounter for screening for depression: Secondary | ICD-10-CM | POA: Diagnosis not present

## 2017-03-23 DIAGNOSIS — Z6822 Body mass index (BMI) 22.0-22.9, adult: Secondary | ICD-10-CM | POA: Diagnosis not present

## 2017-03-23 DIAGNOSIS — L659 Nonscarring hair loss, unspecified: Secondary | ICD-10-CM | POA: Diagnosis not present

## 2017-03-23 DIAGNOSIS — E785 Hyperlipidemia, unspecified: Secondary | ICD-10-CM | POA: Diagnosis not present

## 2017-03-23 DIAGNOSIS — M810 Age-related osteoporosis without current pathological fracture: Secondary | ICD-10-CM | POA: Diagnosis not present

## 2017-03-23 DIAGNOSIS — Z0000 Encounter for general adult medical examination without abnormal findings: Secondary | ICD-10-CM | POA: Diagnosis not present

## 2017-03-23 DIAGNOSIS — R5383 Other fatigue: Secondary | ICD-10-CM | POA: Diagnosis not present

## 2017-03-23 DIAGNOSIS — E559 Vitamin D deficiency, unspecified: Secondary | ICD-10-CM | POA: Diagnosis not present

## 2017-03-23 DIAGNOSIS — E1129 Type 2 diabetes mellitus with other diabetic kidney complication: Secondary | ICD-10-CM | POA: Diagnosis not present

## 2017-03-23 DIAGNOSIS — E7800 Pure hypercholesterolemia, unspecified: Secondary | ICD-10-CM | POA: Diagnosis not present

## 2017-03-23 DIAGNOSIS — Z9181 History of falling: Secondary | ICD-10-CM | POA: Diagnosis not present

## 2017-04-13 DIAGNOSIS — D510 Vitamin B12 deficiency anemia due to intrinsic factor deficiency: Secondary | ICD-10-CM | POA: Diagnosis not present

## 2017-04-20 DIAGNOSIS — D510 Vitamin B12 deficiency anemia due to intrinsic factor deficiency: Secondary | ICD-10-CM | POA: Diagnosis not present

## 2017-04-27 DIAGNOSIS — D510 Vitamin B12 deficiency anemia due to intrinsic factor deficiency: Secondary | ICD-10-CM | POA: Diagnosis not present

## 2017-05-04 DIAGNOSIS — D510 Vitamin B12 deficiency anemia due to intrinsic factor deficiency: Secondary | ICD-10-CM | POA: Diagnosis not present

## 2017-05-11 DIAGNOSIS — D510 Vitamin B12 deficiency anemia due to intrinsic factor deficiency: Secondary | ICD-10-CM | POA: Diagnosis not present

## 2017-06-08 DIAGNOSIS — D510 Vitamin B12 deficiency anemia due to intrinsic factor deficiency: Secondary | ICD-10-CM | POA: Diagnosis not present

## 2017-06-10 DIAGNOSIS — H524 Presbyopia: Secondary | ICD-10-CM | POA: Diagnosis not present

## 2017-06-10 DIAGNOSIS — E119 Type 2 diabetes mellitus without complications: Secondary | ICD-10-CM | POA: Diagnosis not present

## 2017-06-23 DIAGNOSIS — Z1231 Encounter for screening mammogram for malignant neoplasm of breast: Secondary | ICD-10-CM | POA: Diagnosis not present

## 2017-07-06 DIAGNOSIS — L669 Cicatricial alopecia, unspecified: Secondary | ICD-10-CM | POA: Diagnosis not present

## 2017-07-06 DIAGNOSIS — Z79899 Other long term (current) drug therapy: Secondary | ICD-10-CM | POA: Diagnosis not present

## 2017-07-06 DIAGNOSIS — L659 Nonscarring hair loss, unspecified: Secondary | ICD-10-CM | POA: Diagnosis not present

## 2017-07-07 DIAGNOSIS — D510 Vitamin B12 deficiency anemia due to intrinsic factor deficiency: Secondary | ICD-10-CM | POA: Diagnosis not present

## 2017-07-17 DIAGNOSIS — L669 Cicatricial alopecia, unspecified: Secondary | ICD-10-CM | POA: Diagnosis not present

## 2017-07-31 DIAGNOSIS — E559 Vitamin D deficiency, unspecified: Secondary | ICD-10-CM | POA: Diagnosis not present

## 2017-07-31 DIAGNOSIS — E1129 Type 2 diabetes mellitus with other diabetic kidney complication: Secondary | ICD-10-CM | POA: Diagnosis not present

## 2017-07-31 DIAGNOSIS — E538 Deficiency of other specified B group vitamins: Secondary | ICD-10-CM | POA: Diagnosis not present

## 2017-08-11 DIAGNOSIS — E538 Deficiency of other specified B group vitamins: Secondary | ICD-10-CM | POA: Diagnosis not present

## 2017-08-14 DIAGNOSIS — L669 Cicatricial alopecia, unspecified: Secondary | ICD-10-CM | POA: Diagnosis not present

## 2017-09-08 DIAGNOSIS — E538 Deficiency of other specified B group vitamins: Secondary | ICD-10-CM | POA: Diagnosis not present

## 2017-09-17 DIAGNOSIS — L669 Cicatricial alopecia, unspecified: Secondary | ICD-10-CM | POA: Diagnosis not present

## 2017-09-28 DIAGNOSIS — F419 Anxiety disorder, unspecified: Secondary | ICD-10-CM | POA: Diagnosis not present

## 2017-09-28 DIAGNOSIS — G2581 Restless legs syndrome: Secondary | ICD-10-CM | POA: Diagnosis not present

## 2017-09-28 DIAGNOSIS — Z6822 Body mass index (BMI) 22.0-22.9, adult: Secondary | ICD-10-CM | POA: Diagnosis not present

## 2017-09-28 DIAGNOSIS — Z1339 Encounter for screening examination for other mental health and behavioral disorders: Secondary | ICD-10-CM | POA: Diagnosis not present

## 2017-09-28 DIAGNOSIS — E559 Vitamin D deficiency, unspecified: Secondary | ICD-10-CM | POA: Diagnosis not present

## 2017-09-28 DIAGNOSIS — R413 Other amnesia: Secondary | ICD-10-CM | POA: Diagnosis not present

## 2017-09-28 DIAGNOSIS — R110 Nausea: Secondary | ICD-10-CM | POA: Diagnosis not present

## 2017-09-28 DIAGNOSIS — E7800 Pure hypercholesterolemia, unspecified: Secondary | ICD-10-CM | POA: Diagnosis not present

## 2017-09-28 DIAGNOSIS — E1142 Type 2 diabetes mellitus with diabetic polyneuropathy: Secondary | ICD-10-CM | POA: Diagnosis not present

## 2017-10-06 DIAGNOSIS — K760 Fatty (change of) liver, not elsewhere classified: Secondary | ICD-10-CM | POA: Diagnosis not present

## 2017-10-06 DIAGNOSIS — D510 Vitamin B12 deficiency anemia due to intrinsic factor deficiency: Secondary | ICD-10-CM | POA: Diagnosis not present

## 2017-10-06 DIAGNOSIS — R110 Nausea: Secondary | ICD-10-CM | POA: Diagnosis not present

## 2017-10-19 DIAGNOSIS — L668 Other cicatricial alopecia: Secondary | ICD-10-CM | POA: Diagnosis not present

## 2017-10-22 DIAGNOSIS — Z6822 Body mass index (BMI) 22.0-22.9, adult: Secondary | ICD-10-CM | POA: Diagnosis not present

## 2017-10-22 DIAGNOSIS — N390 Urinary tract infection, site not specified: Secondary | ICD-10-CM | POA: Diagnosis not present

## 2017-11-02 ENCOUNTER — Ambulatory Visit: Payer: Medicare Other | Admitting: Neurology

## 2017-11-12 DIAGNOSIS — D510 Vitamin B12 deficiency anemia due to intrinsic factor deficiency: Secondary | ICD-10-CM | POA: Diagnosis not present

## 2017-11-23 DIAGNOSIS — L659 Nonscarring hair loss, unspecified: Secondary | ICD-10-CM | POA: Diagnosis not present

## 2017-12-30 DIAGNOSIS — E559 Vitamin D deficiency, unspecified: Secondary | ICD-10-CM | POA: Diagnosis not present

## 2018-01-13 DIAGNOSIS — D510 Vitamin B12 deficiency anemia due to intrinsic factor deficiency: Secondary | ICD-10-CM | POA: Diagnosis not present

## 2018-02-08 DIAGNOSIS — Z23 Encounter for immunization: Secondary | ICD-10-CM | POA: Diagnosis not present

## 2018-02-08 DIAGNOSIS — D510 Vitamin B12 deficiency anemia due to intrinsic factor deficiency: Secondary | ICD-10-CM | POA: Diagnosis not present

## 2018-03-10 DIAGNOSIS — D510 Vitamin B12 deficiency anemia due to intrinsic factor deficiency: Secondary | ICD-10-CM | POA: Diagnosis not present

## 2018-03-24 DIAGNOSIS — E1129 Type 2 diabetes mellitus with other diabetic kidney complication: Secondary | ICD-10-CM | POA: Diagnosis not present

## 2018-03-24 DIAGNOSIS — I471 Supraventricular tachycardia: Secondary | ICD-10-CM | POA: Diagnosis not present

## 2018-03-24 DIAGNOSIS — E7800 Pure hypercholesterolemia, unspecified: Secondary | ICD-10-CM | POA: Diagnosis not present

## 2018-03-24 DIAGNOSIS — M810 Age-related osteoporosis without current pathological fracture: Secondary | ICD-10-CM | POA: Diagnosis not present

## 2018-03-24 DIAGNOSIS — Z6822 Body mass index (BMI) 22.0-22.9, adult: Secondary | ICD-10-CM | POA: Diagnosis not present

## 2018-03-24 DIAGNOSIS — R809 Proteinuria, unspecified: Secondary | ICD-10-CM | POA: Diagnosis not present

## 2018-03-24 DIAGNOSIS — E785 Hyperlipidemia, unspecified: Secondary | ICD-10-CM | POA: Diagnosis not present

## 2018-03-24 DIAGNOSIS — E559 Vitamin D deficiency, unspecified: Secondary | ICD-10-CM | POA: Diagnosis not present

## 2018-03-24 DIAGNOSIS — J329 Chronic sinusitis, unspecified: Secondary | ICD-10-CM | POA: Diagnosis not present

## 2018-03-24 DIAGNOSIS — D510 Vitamin B12 deficiency anemia due to intrinsic factor deficiency: Secondary | ICD-10-CM | POA: Diagnosis not present

## 2018-04-16 DIAGNOSIS — D510 Vitamin B12 deficiency anemia due to intrinsic factor deficiency: Secondary | ICD-10-CM | POA: Diagnosis not present

## 2018-05-11 ENCOUNTER — Ambulatory Visit (INDEPENDENT_AMBULATORY_CARE_PROVIDER_SITE_OTHER): Payer: PPO | Admitting: Cardiology

## 2018-05-11 ENCOUNTER — Encounter: Payer: Self-pay | Admitting: Cardiology

## 2018-05-11 VITALS — BP 114/70 | HR 80 | Ht 65.0 in | Wt 133.8 lb

## 2018-05-11 DIAGNOSIS — I471 Supraventricular tachycardia, unspecified: Secondary | ICD-10-CM

## 2018-05-11 DIAGNOSIS — E782 Mixed hyperlipidemia: Secondary | ICD-10-CM

## 2018-05-11 HISTORY — DX: Supraventricular tachycardia: I47.1

## 2018-05-11 HISTORY — DX: Supraventricular tachycardia, unspecified: I47.10

## 2018-05-11 MED ORDER — ROSUVASTATIN CALCIUM 5 MG PO TABS: 5.0000 mg | ORAL_TABLET | Freq: Every day | ORAL | 1 refills | Status: DC

## 2018-05-11 MED ORDER — ROSUVASTATIN CALCIUM 10 MG PO TABS: 10.0000 mg | ORAL_TABLET | Freq: Every day | ORAL | 1 refills | Status: DC

## 2018-05-11 NOTE — Progress Notes (Signed)
Cardiology Consultation:    Date:  05/11/2018   ID:  Castro, Robin [birth year], MRN [id number]  PCP:  Angelina Sheriff, MD  Cardiologist:  Jenne Campus, MD   Referring MD: Angelina Sheriff, MD   Chief Complaint  Patient presents with  . PSVT  I have palpitations  History of Present Illness:    Robin Castro is a 78 y.o. female who is being seen today for the evaluation of rotations and dyslipidemia at the request of [name], Robin F. II, MD.  He was diagnosed with paroxysmal supraventricular tachycardia 47 years ago at that time she was put on digoxin she is been taking digoxin for all this time she described the fact that she got rare episode of palpitations that was happening maybe a few times a year lasting for 3 to 5 minutes when she has episodes she will feel her heart speeding up she cannot tell me if it is regular or irregular she usually have to lay down and take few deep breath and then arrhythmia breaks to normal sinus.  Overall she is very active she walks on a regular basis 2-1/2 miles 4-5 times a week and doing well doing it.  She does describe to have some shortness of breath when she does.  Past Medical History:  Diagnosis Date  . Chest discomfort    nl myoview 2010  . Diabetes mellitus    type 2- x 12 years  . Dysrhythmia    h/o rapid heart beat  . GERD (gastroesophageal reflux disease)   . Hypercholesteremia   . Palpitations   . PONV (postoperative nausea and vomiting)   . Pulsatile abdominal mass    neg U/S 2010  . SVD (spontaneous vaginal delivery)    x 2    Past Surgical History:  Procedure Laterality Date  . APPENDECTOMY    . BILATERAL SALPINGECTOMY  04/19/2014   Procedure: BILATERAL SALPINGECTOMY;  Surgeon: Daria Pastures, MD;  Location: [facility];  Service: Gynecology;;  . BLADDER SUSPENSION N/A 04/19/2014   Procedure: TRANSVAGINAL TAPE (TVT) PROCEDURE;  Surgeon: Daria Pastures, MD;  Location: [facility];  Service: Gynecology;   Laterality: N/A;  . COLONOSCOPY    . CYSTOCELE REPAIR N/A 04/19/2014   Procedure: ANTERIOR REPAIR (CYSTOCELE);  Surgeon: Daria Pastures, MD;  Location: [facility];  Service: Gynecology;  Laterality: N/A;  . CYSTOSCOPY N/A 04/19/2014   Procedure: CYSTOSCOPY;  Surgeon: Daria Pastures, MD;  Location: [facility];  Service: Gynecology;  Laterality: N/A;  . TOTAL KNEE ARTHROPLASTY    . VAGINAL HYSTERECTOMY N/A 04/19/2014   Procedure: HYSTERECTOMY VAGINAL;  Surgeon: Daria Pastures, MD;  Location: [facility];  Service: Gynecology;  Laterality: N/A;    Current Medications: Current Meds  Medication Sig  . aspirin 81 MG tablet Take 81 mg by mouth daily.    . Calcium Carbonate-Vitamin D (CALCIUM 600 + D PO) Take 1 tablet by mouth 2 (two) times daily.   . digoxin (LANOXIN) 0.25 MG tablet 1 tab daily alternating with 1 1/2 tab daily   . empagliflozin (JARDIANCE) 10 MG TABS tablet Take 10 mg by mouth daily.  . ergocalciferol (VITAMIN D2) 50000 UNITS capsule Take 50,000 Units by mouth every 14 (fourteen) days.   . fenofibrate (TRICOR) 145 MG tablet Take 145 mg by mouth daily.  Marland Kitchen losartan (COZAAR) 25 MG tablet Take 25 mg by mouth daily.    . metFORMIN (GLUCOPHAGE) 1000 MG tablet Take 1,000 mg  by mouth 2 (two) times daily with a meal.  . omeprazole (PRILOSEC) 20 MG capsule Take 20 mg by mouth as needed.   . Probiotic Product (PROBIOTIC-10 PO) Take 1 capsule by mouth every other day.     Allergies:   Lansoprazole; Statins; Enalapril; and Niacin and related   Social History   Socioeconomic History  . Marital status: Widowed    Spouse name: Not on file  . Number of children: 2  . Years of education: Not on file  . Highest education level: Not on file  Occupational History  . Occupation: retired IT trainer  Social Needs  . Financial resource strain: Not on file  . Food insecurity:    Worry: Not on file    Inability: Not on file  . Transportation needs:    Medical: Not on file    Non-medical: Not  on file  Tobacco Use  . Smoking status: Never Smoker  . Smokeless tobacco: Never Used  Substance and Sexual Activity  . Alcohol use: No  . Drug use: No  . Sexual activity: Yes    Birth control/protection: Post-menopausal  Lifestyle  . Physical activity:    Days per week: Not on file    Minutes per session: Not on file  . Stress: Not on file  Relationships  . Social connections:    Talks on phone: Not on file    Gets together: Not on file    Attends religious service: Not on file    Active member of club or organization: Not on file    Attends meetings of clubs or organizations: Not on file    Relationship status: Not on file  Other Topics Concern  . Not on file  Social History Narrative  . Not on file     Family History: The patient's family history includes Diabetes in an other family member; Heart disease in an other family member; Hyperlipidemia in an other family member; Hypertension in an other family member. ROS:   Please see the history of present illness.    All 14 point review of systems negative except as described per history of present illness.  EKGs/Labs/Other Studies Reviewed:    The following studies were reviewed today:    Recent Labs: No results found for requested labs within last 8760 hours.  Recent Lipid Panel No results found for: CHOL, TRIG, HDL, CHOLHDL, VLDL, LDLCALC, LDLDIRECT  Physical Exam:    VS:  BP 114/70   Pulse 80   Ht 5\' 5"  (1.651 m)   Wt 133 lb 12.8 oz (60.7 kg)   SpO2 98%   BMI 22.27 kg/m     Wt Readings from Last 3 Encounters:  05/11/18 133 lb 12.8 oz (60.7 kg)  04/19/14 135 lb (61.2 kg)  04/13/14 135 lb (61.2 kg)     GEN:  Well nourished, well developed in no acute distress HEENT: Normal NECK: No JVD; No carotid bruits LYMPHATICS: No lymphadenopathy CARDIAC: RRR, no murmurs, no rubs, no gallops RESPIRATORY:  Clear to auscultation without rales, wheezing or rhonchi  ABDOMEN: Soft, non-tender,  non-distended MUSCULOSKELETAL:  No edema; No deformity  SKIN: Warm and dry NEUROLOGIC:  Alert and oriented x 3 PSYCHIATRIC:  Normal affect   ASSESSMENT:    1. Mixed hyperlipidemia   2. Paroxysmal SVT (supraventricular tachycardia) (HCC)    PLAN:    In order of problems listed above:  1. Mixed dyslipidemia she is intolerant to statin however she was able to tolerate Crestor she told  me that Crestor was withdrawn because of insurance issues that happened few years ago now Crestor is urinary can I think we can try to put her back on this medication.  If not option is PCSK9 agent.  I will start her with 5 mg of Crestor every single day.  Letter will check her fasting lipid profile I will call primary care physician to get her fasting lipid profile.  She knows about dieting she knows about what to do to try to bring cholesterol down with nonpharmacological way. 2. Paroxysmal supraventricular tachycardia she has very rare palpitations are more about digoxin level.  She takes quite high digoxin dose with for control of her arrhythmia I will check digoxin level I will also do echocardiogram to assess left ventricular ejection fraction look at the left atrial size if left atrium size is significantly enlarged I will proceed with monitor trend to see if she got episode of atrial fibrillation.  Luckily she denies having any sustained arrhythmia. 3. Dyspnea on exertion echocardiogram will be done.   Medication Adjustments/Labs and Tests Ordered: Current medicines are reviewed at length with the patient today.  Concerns regarding medicines are outlined above.  No orders of the defined types were placed in this encounter.  No orders of the defined types were placed in this encounter.   Signed, Park Liter, MD, [facility] 05/11/2018 1:59 PM    [place]

## 2018-05-11 NOTE — Patient Instructions (Signed)
Medication Instructions:  Your physician has recommended you make the following change in your medication:  Start: Crestor 5 mg daily  If you need a refill on your cardiac medications before your next appointment, please call your pharmacy.   Lab work: Your physician recommends that you return for lab work today: digoxin level   If you have labs (blood work) drawn today and your tests are completely normal, you will receive your results only by: Marland Kitchen MyChart Message (if you have MyChart) OR . A paper copy in the mail If you have any lab test that is abnormal or we need to change your treatment, we will call you to review the results.  Testing/Procedures: Your physician has requested that you have an echocardiogram. Echocardiography is a painless test that uses sound waves to create images of your heart. It provides your doctor with information about the size and shape of your heart and how well your heart's chambers and valves are working. This procedure takes approximately one hour. There are no restrictions for this procedure.    Follow-Up: At [facility], you and your health needs are our priority.  As part of our continuing mission to provide you with exceptional heart care, we have created designated Provider Care Teams.  These Care Teams include your primary Cardiologist (physician) and Advanced Practice Providers (APPs -  Physician Assistants and Nurse Practitioners) who all work together to provide you with the care you need, when you need it. You will need a follow up appointment in 1 months.  Please call our office 2 months in advance to schedule this appointment.  You may see No primary care provider on file. or another member of our [employer] Provider Team in [place]: Shirlee More, MD . Jyl Heinz, MD  Any Other Special Instructions Will Be Listed Below (If Applicable).  Rosuvastatin Tablets What is this medicine? ROSUVASTATIN (roe SOO va sta tin) is known as a  HMG-CoA reductase inhibitor or 'statin'. It lowers cholesterol and triglycerides in the blood. This drug may also reduce the risk of heart attack, stroke, or other health problems in patients with risk factors for heart disease. Diet and lifestyle changes are often used with this drug. This medicine may be used for other purposes; ask your health care provider or pharmacist if you have questions. COMMON BRAND NAME(S): Crestor What should I tell my health care provider before I take this medicine? They need to know if you have any of these conditions: -diabetes -if you often drink alcohol -history of stroke -kidney disease -liver disease -muscle aches or weakness -thyroid disease -an unusual or allergic reaction to rosuvastatin, other medicines, foods, dyes, or preservatives -pregnant or trying to get pregnant -breast-feeding How should I use this medicine? Take this medicine by mouth with a glass of water. Follow the directions on the prescription label. Do not cut, crush or chew this medicine. You can take this medicine with or without food. Take your doses at regular intervals. Do not take your medicine more often than directed. Talk to your pediatrician regarding the use of this medicine in children. While this drug may be prescribed for children as young as 11 years old for selected conditions, precautions do apply. Overdosage: If you think you have taken too much of this medicine contact a poison control center or emergency room at once. NOTE: This medicine is only for you. Do not share this medicine with others. What if I miss a dose? If you miss a dose, take  it as soon as you can. If your next dose is to be taken in less than 12 hours, then do not take the missed dose. Take the next dose at your regular time. Do not take double or extra doses. What may interact with this medicine? Do not take this medicine with any of the following medications: -herbal medicines like red yeast  rice This medicine may also interact with the following medications: -alcohol -antacids containing aluminum hydroxide or magnesium hydroxide -cyclosporine -other medicines for high cholesterol -some medicines for HIV infection -warfarin This list may not describe all possible interactions. Give your health care provider a list of all the medicines, herbs, non-prescription drugs, or dietary supplements you use. Also tell them if you smoke, drink alcohol, or use illegal drugs. Some items may interact with your medicine. What should I watch for while using this medicine? Visit your doctor or health care professional for regular check-ups. You may need regular tests to make sure your liver is working properly. Your health care professional may tell you to stop taking this medicine if you develop muscle problems. If your muscle problems do not go away after stopping this medicine, contact your health care professional. Do not become pregnant while taking this medicine. Women should inform their health care professional if they wish to become pregnant or think they might be pregnant. There is a potential for serious side effects to an unborn child. Talk to your health care professional or pharmacist for more information. Do not breast-feed an infant while taking this medicine. This medicine may affect blood sugar levels. If you have diabetes, check with your doctor or health care professional before you change your diet or the dose of your diabetic medicine. If you are going to need surgery or other procedure, tell your doctor that you are using this medicine. This drug is only part of a total heart-health program. Your doctor or a dietician can suggest a low-cholesterol and low-fat diet to help. Avoid alcohol and smoking, and keep a proper exercise schedule. This medicine may cause a decrease in Co-Enzyme Q-10. You should make sure that you get enough Co-Enzyme Q-10 while you are taking this medicine.  Discuss the foods you eat and the vitamins you take with your health care professional. What side effects may I notice from receiving this medicine? Side effects that you should report to your doctor or health care professional as soon as possible: -allergic reactions like skin rash, itching or hives, swelling of the face, lips, or tongue -dark urine -fever -joint pain -muscle cramps, pain -redness, blistering, peeling or loosening of the skin, including inside the mouth -trouble passing urine or change in the amount of urine -unusually weak or tired -yellowing of the eyes or skin Side effects that usually do not require medical attention (report to your doctor or health care professional if they continue or are bothersome): -constipation -heartburn -nausea -stomach gas, pain, upset This list may not describe all possible side effects. Call your doctor for medical advice about side effects. You may report side effects to FDA at 1-800-FDA-1088. Where should I keep my medicine? Keep out of the reach of children. Store at room temperature between 20 and 25 degrees C (68 and 77 degrees F). Keep container tightly closed (protect from moisture). Throw away any unused medicine after the expiration date. NOTE: This sheet is a summary. It may not cover all possible information. If you have questions about this medicine, talk to your doctor, pharmacist, or health  care provider.  2019 Elsevier/Gold Standard (2016-11-25 12:42:43)   Echocardiogram An echocardiogram is a procedure that uses painless sound waves (ultrasound) to produce an image of the heart. Images from an echocardiogram can provide important information about:  Signs of coronary artery disease (CAD).  Aneurysm detection. An aneurysm is a weak or damaged part of an artery wall that bulges out from the normal force of blood pumping through the body.  Heart size and shape. Changes in the size or shape of the heart can be associated  with certain conditions, including heart failure, aneurysm, and CAD.  Heart muscle function.  Heart valve function.  Signs of a past heart attack.  Fluid buildup around the heart.  Thickening of the heart muscle.  A tumor or infectious growth around the heart valves. Tell a health care provider about:  Any allergies you have.  All medicines you are taking, including vitamins, herbs, eye drops, creams, and over-the-counter medicines.  Any blood disorders you have.  Any surgeries you have had.  Any medical conditions you have.  Whether you are pregnant or may be pregnant. What are the risks? Generally, this is a safe procedure. However, problems may occur, including:  Allergic reaction to dye (contrast) that may be used during the procedure. What happens before the procedure? No specific preparation is needed. You may eat and drink normally. What happens during the procedure?   An IV tube may be inserted into one of your veins.  You may receive contrast through this tube. A contrast is an injection that improves the quality of the pictures from your heart.  A gel will be applied to your chest.  A wand-like tool (transducer) will be moved over your chest. The gel will help to transmit the sound waves from the transducer.  The sound waves will harmlessly bounce off of your heart to allow the heart images to be captured in real-time motion. The images will be recorded on a computer. The procedure may vary among health care providers and hospitals. What happens after the procedure?  You may return to your normal, everyday life, including diet, activities, and medicines, unless your health care provider tells you not to do that. Summary  An echocardiogram is a procedure that uses painless sound waves (ultrasound) to produce an image of the heart.  Images from an echocardiogram can provide important information about the size and shape of your heart, heart muscle function,  heart valve function, and fluid buildup around your heart.  You do not need to do anything to prepare before this procedure. You may eat and drink normally.  After the echocardiogram is completed, you may return to your normal, everyday life, unless your health care provider tells you not to do that. This information is not intended to replace advice given to you by your health care provider. Make sure you discuss any questions you have with your health care provider. Document Released: 03/21/2000 Document Revised: 04/26/2016 Document Reviewed: 04/26/2016 Elsevier Interactive Patient Education  2019 [employer].

## 2018-05-12 ENCOUNTER — Telehealth: Payer: Self-pay | Admitting: Emergency Medicine

## 2018-05-12 DIAGNOSIS — D510 Vitamin B12 deficiency anemia due to intrinsic factor deficiency: Secondary | ICD-10-CM | POA: Diagnosis not present

## 2018-05-12 LAB — DIGOXIN LEVEL: Digoxin, Serum: 1.7 ng/mL — ABNORMAL HIGH (ref 0.5–0.9)

## 2018-05-12 MED ORDER — DIGOXIN 250 MCG PO TABS: ORAL_TABLET | ORAL | 1 refills | Status: DC

## 2018-05-12 NOTE — Telephone Encounter (Signed)
Patient informed of results. Advised patient to decrease digoxin to 0.25 mg daily. Patient verbally understands

## 2018-05-31 DIAGNOSIS — R309 Painful micturition, unspecified: Secondary | ICD-10-CM | POA: Diagnosis not present

## 2018-05-31 DIAGNOSIS — E119 Type 2 diabetes mellitus without complications: Secondary | ICD-10-CM

## 2018-05-31 DIAGNOSIS — E0829 Diabetes mellitus due to underlying condition with other diabetic kidney complication: Secondary | ICD-10-CM | POA: Insufficient documentation

## 2018-05-31 HISTORY — DX: Type 2 diabetes mellitus without complications: E11.9

## 2018-06-10 ENCOUNTER — Ambulatory Visit (INDEPENDENT_AMBULATORY_CARE_PROVIDER_SITE_OTHER): Payer: PPO

## 2018-06-10 DIAGNOSIS — E782 Mixed hyperlipidemia: Secondary | ICD-10-CM

## 2018-06-10 DIAGNOSIS — I471 Supraventricular tachycardia: Secondary | ICD-10-CM | POA: Diagnosis not present

## 2018-06-10 DIAGNOSIS — N390 Urinary tract infection, site not specified: Secondary | ICD-10-CM | POA: Diagnosis not present

## 2018-06-10 NOTE — Progress Notes (Signed)
Complete echocardiogram has been performed.  Jimmy Fong Mccarry RDCS, RVT 

## 2018-06-11 ENCOUNTER — Ambulatory Visit (INDEPENDENT_AMBULATORY_CARE_PROVIDER_SITE_OTHER): Payer: PPO | Admitting: Cardiology

## 2018-06-11 ENCOUNTER — Encounter: Payer: Self-pay | Admitting: Cardiology

## 2018-06-11 VITALS — BP 128/60 | HR 61 | Ht 65.0 in | Wt 133.2 lb

## 2018-06-11 DIAGNOSIS — E782 Mixed hyperlipidemia: Secondary | ICD-10-CM

## 2018-06-11 DIAGNOSIS — I471 Supraventricular tachycardia: Secondary | ICD-10-CM | POA: Diagnosis not present

## 2018-06-11 MED ORDER — DIGOXIN 125 MCG PO TABS: ORAL_TABLET | ORAL | 1 refills | Status: DC

## 2018-06-11 NOTE — Patient Instructions (Addendum)
Medication Instructions:  Your physician has recommended you make the following change in your medication:   DECREASE digoxin from 0.25to 0.125 mg daily    If you need a refill on your cardiac medications before your next appointment, please call your pharmacy.   Lab work: NONE If you have labs (blood work) drawn today and your tests are completely normal, you will receive your results only by: Marland Kitchen MyChart Message (if you have MyChart) OR . A paper copy in the mail If you have any lab test that is abnormal or we need to change your treatment, we will call you to review the results.  Testing/Procedures: NONE  Follow-Up: At [facility], you and your health needs are our priority.  As part of our continuing mission to provide you with exceptional heart care, we have created designated Provider Care Teams.  These Care Teams include your primary Cardiologist (physician) and Advanced Practice Providers (APPs -  Physician Assistants and Nurse Practitioners) who all work together to provide you with the care you need, when you need it. You will need a follow up appointment in 1 months.

## 2018-06-11 NOTE — Progress Notes (Signed)
Cardiology Office Note:    Date:  06/11/2018   ID:  Robin Castro, Robin Castro [birth year], MRN [id number]  PCP:  Angelina Sheriff, MD  Cardiologist:  Jenne Campus, MD    Referring MD: Angelina Sheriff, MD   Chief Complaint  Patient presents with  . 1 month follow up  Doing well  History of Present Illness:    Robin Castro is a 78 y.o. female week longstanding history of proximal supraventricular tachycardia that was managed with digoxin however lately she started experiencing some nausea weight loss and lack of appetite she was taking 0.25 alternate with 0.375 mg of digoxin.  Her last digoxin level was elevated asked her to cut her down.  She still feeling some nausea therefore we debated today about potentially reducing significant production 20 0.125 mg daily or completely switch her to a different medication like calcium channel blocker beta-blocker she prefers to little dose of the dose of digoxin which I will do.  I warned her about the fact that she may have some breakthrough arrhythmia and we were talking length about what to do when she develops those symptoms.  I will see her back in about 1 month to see how she does.  Past Medical History:  Diagnosis Date  . Chest discomfort    nl myoview 2010  . Diabetes mellitus    type 2- x 12 years  . Dysrhythmia    h/o rapid heart beat  . GERD (gastroesophageal reflux disease)   . Hypercholesteremia   . Palpitations   . PONV (postoperative nausea and vomiting)   . Pulsatile abdominal mass    neg U/S 2010  . SVD (spontaneous vaginal delivery)    x 2    Past Surgical History:  Procedure Laterality Date  . APPENDECTOMY    . BILATERAL SALPINGECTOMY  04/19/2014   Procedure: BILATERAL SALPINGECTOMY;  Surgeon: Daria Pastures, MD;  Location: [facility];  Service: Gynecology;;  . BLADDER SUSPENSION N/A 04/19/2014   Procedure: TRANSVAGINAL TAPE (TVT) PROCEDURE;  Surgeon: Daria Pastures, MD;  Location: [facility];  Service:  Gynecology;  Laterality: N/A;  . COLONOSCOPY    . CYSTOCELE REPAIR N/A 04/19/2014   Procedure: ANTERIOR REPAIR (CYSTOCELE);  Surgeon: Daria Pastures, MD;  Location: [facility];  Service: Gynecology;  Laterality: N/A;  . CYSTOSCOPY N/A 04/19/2014   Procedure: CYSTOSCOPY;  Surgeon: Daria Pastures, MD;  Location: [facility];  Service: Gynecology;  Laterality: N/A;  . TOTAL KNEE ARTHROPLASTY    . VAGINAL HYSTERECTOMY N/A 04/19/2014   Procedure: HYSTERECTOMY VAGINAL;  Surgeon: Daria Pastures, MD;  Location: [facility];  Service: Gynecology;  Laterality: N/A;    Current Medications: Current Meds  Medication Sig  . aspirin 81 MG tablet Take 81 mg by mouth daily.    . Calcium Carbonate-Vitamin D (CALCIUM 600 + D PO) Take 1 tablet by mouth 2 (two) times daily.   . digoxin (LANOXIN) 0.25 MG tablet Take 1 tablet daily  . empagliflozin (JARDIANCE) 10 MG TABS tablet Take 10 mg by mouth daily.  . ergocalciferol (VITAMIN D2) 50000 UNITS capsule Take 50,000 Units by mouth every 14 (fourteen) days.   . fenofibrate (TRICOR) 145 MG tablet Take 145 mg by mouth daily.  Marland Kitchen losartan (COZAAR) 25 MG tablet Take 25 mg by mouth daily.    . metFORMIN (GLUCOPHAGE) 1000 MG tablet Take 1,000 mg by mouth 2 (two) times daily with a meal.  . omeprazole (PRILOSEC) 20 MG capsule  Take 20 mg by mouth as needed.   . Probiotic Product (PROBIOTIC-10 PO) Take 1 capsule by mouth every other day.  . rosuvastatin (CRESTOR) 5 MG tablet Take 1 tablet (5 mg total) by mouth daily.  Marland Kitchen sulfamethoxazole-trimethoprim (BACTRIM DS,SEPTRA DS) 800-160 MG tablet Take 1 tablet by mouth 2 (two) times daily. for 7 days     Allergies:   Lansoprazole; Statins; Enalapril; and Niacin and related   Social History   Socioeconomic History  . Marital status: Widowed    Spouse name: Not on file  . Number of children: 2  . Years of education: Not on file  . Highest education level: Not on file  Occupational History  . Occupation: retired IT trainer    Social Needs  . Financial resource strain: Not on file  . Food insecurity:    Worry: Not on file    Inability: Not on file  . Transportation needs:    Medical: Not on file    Non-medical: Not on file  Tobacco Use  . Smoking status: Never Smoker  . Smokeless tobacco: Never Used  Substance and Sexual Activity  . Alcohol use: No  . Drug use: No  . Sexual activity: Yes    Birth control/protection: Post-menopausal  Lifestyle  . Physical activity:    Days per week: Not on file    Minutes per session: Not on file  . Stress: Not on file  Relationships  . Social connections:    Talks on phone: Not on file    Gets together: Not on file    Attends religious service: Not on file    Active member of club or organization: Not on file    Attends meetings of clubs or organizations: Not on file    Relationship status: Not on file  Other Topics Concern  . Not on file  Social History Narrative  . Not on file     Family History: The patient's family history includes Diabetes in an other family member; Heart disease in an other family member; Hyperlipidemia in an other family member; Hypertension in an other family member. ROS:   Please see the history of present illness.    All 14 point review of systems negative except as described per history of present illness  EKGs/Labs/Other Studies Reviewed:      Recent Labs: No results found for requested labs within last 8760 hours.  Recent Lipid Panel No results found for: CHOL, TRIG, HDL, CHOLHDL, VLDL, LDLCALC, LDLDIRECT  Physical Exam:    VS:  BP 128/60   Pulse 61   Ht 5\' 5"  (1.651 m)   Wt 133 lb 3.2 oz (60.4 kg)   SpO2 98%   BMI 22.17 kg/m     Wt Readings from Last 3 Encounters:  06/11/18 133 lb 3.2 oz (60.4 kg)  05/11/18 133 lb 12.8 oz (60.7 kg)  04/19/14 135 lb (61.2 kg)     GEN:  Well nourished, well developed in no acute distress HEENT: Normal NECK: No JVD; No carotid bruits LYMPHATICS: No lymphadenopathy CARDIAC:  RRR, no murmurs, no rubs, no gallops RESPIRATORY:  Clear to auscultation without rales, wheezing or rhonchi  ABDOMEN: Soft, non-tender, non-distended MUSCULOSKELETAL:  No edema; No deformity  SKIN: Warm and dry LOWER EXTREMITIES: no swelling NEUROLOGIC:  Alert and oriented x 3 PSYCHIATRIC:  Normal affect   ASSESSMENT:    1. Paroxysmal SVT (supraventricular tachycardia) ([place])   2. Mixed hyperlipidemia    PLAN:    In order of  problems listed above:  1. SVT successfully managed with digoxin however digoxin level at the toxic level.  He will significantly cut down digoxin. 2. Mixed dyslipidemia followed by internal medicine team. 3. Statin intolerance luckily she is able to tolerate Crestor now.   Medication Adjustments/Labs and Tests Ordered: Current medicines are reviewed at length with the patient today.  Concerns regarding medicines are outlined above.  No orders of the defined types were placed in this encounter.  Medication changes: No orders of the defined types were placed in this encounter.   Signed, Park Liter, MD, [facility] 06/11/2018 10:21 AM    [place]

## 2018-06-21 DIAGNOSIS — F419 Anxiety disorder, unspecified: Secondary | ICD-10-CM | POA: Diagnosis not present

## 2018-06-21 DIAGNOSIS — E785 Hyperlipidemia, unspecified: Secondary | ICD-10-CM | POA: Diagnosis not present

## 2018-06-21 DIAGNOSIS — E7800 Pure hypercholesterolemia, unspecified: Secondary | ICD-10-CM | POA: Diagnosis not present

## 2018-06-21 DIAGNOSIS — Z1331 Encounter for screening for depression: Secondary | ICD-10-CM | POA: Diagnosis not present

## 2018-06-21 DIAGNOSIS — Z9181 History of falling: Secondary | ICD-10-CM | POA: Diagnosis not present

## 2018-06-21 DIAGNOSIS — E1129 Type 2 diabetes mellitus with other diabetic kidney complication: Secondary | ICD-10-CM | POA: Diagnosis not present

## 2018-06-21 DIAGNOSIS — D519 Vitamin B12 deficiency anemia, unspecified: Secondary | ICD-10-CM | POA: Diagnosis not present

## 2018-06-21 DIAGNOSIS — Z6822 Body mass index (BMI) 22.0-22.9, adult: Secondary | ICD-10-CM | POA: Diagnosis not present

## 2018-06-21 DIAGNOSIS — D510 Vitamin B12 deficiency anemia due to intrinsic factor deficiency: Secondary | ICD-10-CM | POA: Diagnosis not present

## 2018-06-21 DIAGNOSIS — Z0000 Encounter for general adult medical examination without abnormal findings: Secondary | ICD-10-CM | POA: Diagnosis not present

## 2018-07-09 ENCOUNTER — Other Ambulatory Visit: Payer: Self-pay

## 2018-07-09 ENCOUNTER — Encounter: Payer: Self-pay | Admitting: Cardiology

## 2018-07-09 ENCOUNTER — Telehealth (INDEPENDENT_AMBULATORY_CARE_PROVIDER_SITE_OTHER): Payer: PPO | Admitting: Cardiology

## 2018-07-09 VITALS — BP 120/69 | HR 62 | Wt 132.0 lb

## 2018-07-09 DIAGNOSIS — E119 Type 2 diabetes mellitus without complications: Secondary | ICD-10-CM | POA: Insufficient documentation

## 2018-07-09 DIAGNOSIS — E782 Mixed hyperlipidemia: Secondary | ICD-10-CM

## 2018-07-09 DIAGNOSIS — I471 Supraventricular tachycardia: Secondary | ICD-10-CM | POA: Diagnosis not present

## 2018-07-09 NOTE — Patient Instructions (Signed)
Medication Instructions:  Your physician recommends that you continue on your current medications as directed. Please refer to the Current Medication list given to you today.  If you need a refill on your cardiac medications before your next appointment, please call your pharmacy.   Lab work: None.  If you have labs (blood work) drawn today and your tests are completely normal, you will receive your results only by: . MyChart Message (if you have MyChart) OR . A paper copy in the mail If you have any lab test that is abnormal or we need to change your treatment, we will call you to review the results.  Testing/Procedures: None.   Follow-Up: At [facility], you and your health needs are our priority.  As part of our continuing mission to provide you with exceptional heart care, we have created designated Provider Care Teams.  These Care Teams include your primary Cardiologist (physician) and Advanced Practice Providers (APPs -  Physician Assistants and Nurse Practitioners) who all work together to provide you with the care you need, when you need it. You will need a follow up appointment in 3 months.  Please call our office 2 months in advance to schedule this appointment.  You may see No primary care provider on file. or another member of our [facility] Provider Team in [place]: Brian Munley, MD . Rajan Revankar, MD  Any Other Special Instructions Will Be Listed Below (If Applicable).     

## 2018-07-09 NOTE — Progress Notes (Signed)
Virtual Visit via Video Note    Evaluation Performed:  Follow-up visit  This visit type was conducted due to national recommendations for restrictions regarding the COVID-19 Pandemic (e.g. social distancing).  This format is felt to be most appropriate for this patient at this time.  All issues noted in this document were discussed and addressed.  No physical exam was performed (except for noted visual exam findings with Video Visits).  Please refer to the patient's chart (MyChart message for video visits and phone note for telephone visits) for the patient's consent to telehealth for [facility].  Date:  07/09/2018  ID: Robin, Castro 06-27-1940, MRN [id number]   Patient Location:  [place] [address]   Provider location:   [place] Office  PCP:  Angelina Sheriff, MD  Cardiologist:  Jenne Campus, MD     Chief Complaint: Doing well  History of Present Illness:    Robin Castro is a 78 y.o. female  who presents via audio/video conferencing for a telehealth visit today.  With paroxysmal supraventricular tachycardia to be managed for years with high dose of digoxin however she started having some side effect of it therefore we decided to reduce dose of digoxin now she takes only 0.125 mg daily.  Denies have any palpitations no tightness squeezing pressure burning chest overall she seems to be doing well.  Also I put her on small dose of statin Crestor 5 mg daily and she seems to be tolerating this quite well.  We did spend some time talking about social distancing.  She lives in community and she got 4 friends that she is close contact with.  In the matter-of-fact 1 of her friend is my patient and she was with her in the room when we have this video conferencing.  We use face time.  She try to walk on a regular basis she does 3 miles every single day and feeling good doing it.   The patient does not have symptoms concerning  for COVID-19 infection (fever, chills, cough, or new SHORTNESS OF BREATH).    Prior CV studies:   The following studies were reviewed today:  Echocardiogram done on June 10, 2018 showed: FINDINGS  Left Ventricle: The left ventricle has normal systolic function, with an ejection fraction of 60-65%. The cavity size was normal. There is mild concentric left ventricular hypertrophy. Left ventricular diastolic Doppler parameters are consistent with  impaired relaxation Normal left ventricular filling pressures No evidence of left ventricular regional wall motion abnormalities..  All segments are normal.     Past Medical History:  Diagnosis Date  . Chest discomfort    nl myoview 2010  . Diabetes mellitus    type 2- x 12 years  . Dysrhythmia    h/o rapid heart beat  . GERD (gastroesophageal reflux disease)   . Hypercholesteremia   . Palpitations   . PONV (postoperative nausea and vomiting)   . Pulsatile abdominal mass    neg U/S 2010  . SVD (spontaneous vaginal delivery)    x 2    Past Surgical History:  Procedure Laterality Date  . APPENDECTOMY    . BILATERAL SALPINGECTOMY  04/19/2014   Procedure: BILATERAL SALPINGECTOMY;  Surgeon: Daria Pastures, MD;  Location: [facility];  Service: Gynecology;;  . BLADDER SUSPENSION N/A 04/19/2014   Procedure: TRANSVAGINAL TAPE (TVT) PROCEDURE;  Surgeon: Daria Pastures, MD;  Location: [facility];  Service: Gynecology;  Laterality:  N/A;  . COLONOSCOPY    . CYSTOCELE REPAIR N/A 04/19/2014   Procedure: ANTERIOR REPAIR (CYSTOCELE);  Surgeon: Daria Pastures, MD;  Location: [facility];  Service: Gynecology;  Laterality: N/A;  . CYSTOSCOPY N/A 04/19/2014   Procedure: CYSTOSCOPY;  Surgeon: Daria Pastures, MD;  Location: [facility];  Service: Gynecology;  Laterality: N/A;  . TOTAL KNEE ARTHROPLASTY    . VAGINAL HYSTERECTOMY N/A 04/19/2014   Procedure: HYSTERECTOMY VAGINAL;  Surgeon: Daria Pastures, MD;  Location: [facility];  Service: Gynecology;   Laterality: N/A;     Current Meds  Medication Sig  . aspirin 81 MG tablet Take 81 mg by mouth daily.    . Calcium Carbonate-Vitamin D (CALCIUM 600 + D PO) Take 1 tablet by mouth 2 (two) times daily.   . digoxin (LANOXIN) 0.125 MG tablet Take 1 tablet daily  . ergocalciferol (VITAMIN D2) 50000 UNITS capsule Take 50,000 Units by mouth every 14 (fourteen) days.   . fenofibrate (TRICOR) 145 MG tablet Take 145 mg by mouth daily.  Marland Kitchen glimepiride (AMARYL) 2 MG tablet Take 2 mg by mouth daily.  Marland Kitchen losartan (COZAAR) 25 MG tablet Take 25 mg by mouth daily.    . metFORMIN (GLUCOPHAGE) 1000 MG tablet Take 1,000 mg by mouth 2 (two) times daily with a meal.  . omeprazole (PRILOSEC) 20 MG capsule Take 20 mg by mouth as needed.   . Probiotic Product (PROBIOTIC-10 PO) Take 1 capsule by mouth every other day.  . rosuvastatin (CRESTOR) 5 MG tablet Take 1 tablet (5 mg total) by mouth daily.  Marland Kitchen sulfamethoxazole-trimethoprim (BACTRIM DS,SEPTRA DS) 800-160 MG tablet Take 1 tablet by mouth 2 (two) times daily. for 7 days      Family History: The patient's family history includes Diabetes in an other family member; Heart disease in an other family member; Hyperlipidemia in an other family member; Hypertension in an other family member.   ROS:   Please see the history of present illness.     All other systems reviewed and are negative.   Labs/Other Tests and Data Reviewed:     Recent Labs: No results found for requested labs within last 8760 hours.  Recent Lipid Panel No results found for: CHOL, TRIG, HDL, CHOLHDL, VLDL, LDLCALC, LDLDIRECT    Exam:    Vital Signs:  BP 120/69   Pulse 62   Wt 132 lb (59.9 kg)   BMI 21.97 kg/m     Wt Readings from Last 3 Encounters:  07/09/18 132 lb (59.9 kg)  06/11/18 133 lb 3.2 oz (60.4 kg)  05/11/18 133 lb 12.8 oz (60.7 kg)     Well nourished, well developed female in no acute distress. She is alert awake oriented x3 not in any distress.  There is no JVD  there is no swelling of lower extremities.  She is very cheerful and happy to see me on the video link.  Diagnosis for this visit:   1. Paroxysmal SVT (supraventricular tachycardia) ([place])   2. Mixed hyperlipidemia   3. Type 2 diabetes mellitus without complication, without long-term current use of insulin ([place])      ASSESSMENT & PLAN:    1.  Paroxysmal supraventricular tachycardia denies having any spells.  A small dose of beta-blocker which I will continue.  I want her if she will have more problems that we can switch her to beta-blocker calcium channel blocker.  She said she is fine and she does not want to change anything. 3.  Mixed hyperlipidemia.  She is able to tolerate 5 mg Crestor.  Will schedule her to have fasting lipid profile done.  Now because of coronavirus restrictions will not be able to do it. 4.  Type 2 diabetes.  That being followed by internal medicine team she was in [place] however that medication had to be discontinued because of frequent urinary tract infections.  COVID-19 Education: The signs and symptoms of COVID-19 were discussed with the patient and how to seek care for testing (follow up with PCP or arrange E-visit).  The importance of social distancing was discussed today.  Patient Risk:   After full review of this patients clinical status, I feel that they are at least moderate risk at this time.  Time:   Today, I have spent 17 minutes with the patient with telehealth technology discussing pt health issues. Visit was finished at 1014.    Medication Adjustments/Labs and Tests Ordered: Current medicines are reviewed at length with the patient today.  Concerns regarding medicines are outlined above.  No orders of the defined types were placed in this encounter.  Medication changes: No orders of the defined types were placed in this encounter.    Disposition: Follow-up with [place] in 3 months or sooner if she has some palpitations.  Signed, Park Liter, MD, [facility] 07/09/2018 10:13 AM    [place]

## 2018-07-14 ENCOUNTER — Ambulatory Visit: Payer: PPO | Admitting: Cardiology

## 2018-09-07 DIAGNOSIS — D510 Vitamin B12 deficiency anemia due to intrinsic factor deficiency: Secondary | ICD-10-CM | POA: Diagnosis not present

## 2018-10-12 ENCOUNTER — Encounter: Payer: Self-pay | Admitting: Cardiology

## 2018-10-12 ENCOUNTER — Telehealth (INDEPENDENT_AMBULATORY_CARE_PROVIDER_SITE_OTHER): Payer: PPO | Admitting: Cardiology

## 2018-10-12 ENCOUNTER — Other Ambulatory Visit: Payer: Self-pay

## 2018-10-12 VITALS — BP 120/63 | HR 57 | Wt 135.0 lb

## 2018-10-12 DIAGNOSIS — Z79899 Other long term (current) drug therapy: Secondary | ICD-10-CM

## 2018-10-12 DIAGNOSIS — I471 Supraventricular tachycardia: Secondary | ICD-10-CM | POA: Diagnosis not present

## 2018-10-12 DIAGNOSIS — E782 Mixed hyperlipidemia: Secondary | ICD-10-CM

## 2018-10-12 DIAGNOSIS — E119 Type 2 diabetes mellitus without complications: Secondary | ICD-10-CM

## 2018-10-12 DIAGNOSIS — Z5181 Encounter for therapeutic drug level monitoring: Secondary | ICD-10-CM

## 2018-10-12 NOTE — Progress Notes (Signed)
Virtual Visit via Video Note   This visit type was conducted due to national recommendations for restrictions regarding the COVID-19 Pandemic (e.g. social distancing) in an effort to limit this patient's exposure and mitigate transmission in our community.  Due to her co-morbid illnesses, this patient is at least at moderate risk for complications without adequate follow up.  This format is felt to be most appropriate for this patient at this time.  All issues noted in this document were discussed and addressed.  A limited physical exam was performed with this format.  Please refer to the patient's chart for her consent to telehealth for [facility].  Evaluation Performed:  Follow-up visit  This visit type was conducted due to national recommendations for restrictions regarding the COVID-19 Pandemic (e.g. social distancing).  This format is felt to be most appropriate for this patient at this time.  All issues noted in this document were discussed and addressed.  No physical exam was performed (except for noted visual exam findings with Video Visits).  Please refer to the patient's chart (MyChart message for video visits and phone note for telephone visits) for the patient's consent to telehealth for [facility].  Date:  10/12/2018  ID: Robin Castro, Robin Castro 01-26-1941, MRN [id number]   Patient Location: [place] [address]   Provider location:   [place] Office  PCP:  Angelina Sheriff, MD  Cardiologist:  Jenne Campus, MD     Chief Complaint: I just had one episode of supraventricular tachycardia  History of Present Illness:    Robin Castro is a 78 y.o. female  who presents via audio/video conferencing for a telehealth visit today.  With past medical history significant for supraventricular tachycardia, successfully suppressed with digoxin, recently dose of digoxin has been decreased because of high level.  Since that time she  described 1 episode of palpitations.  It lasted for about 20 minutes she took extra digoxin for that episode.  Otherwise she said things stabilize and there is no trouble.  Overall she is doing very well and she feels well.  She is able to walk climb stairs with no difficulties.  She take precaution against coronavirus and try not to go to the beach that she had opportunity to go last week.   The patient does not have symptoms concerning for COVID-19 infection (fever, chills, cough, or new SHORTNESS OF BREATH).    Prior CV studies:   The following studies were reviewed today:       Past Medical History:  Diagnosis Date   Chest discomfort    nl myoview 2010   Diabetes mellitus    type 2- x 12 years   Dysrhythmia    h/o rapid heart beat   GERD (gastroesophageal reflux disease)    Hypercholesteremia    Palpitations    PONV (postoperative nausea and vomiting)    Pulsatile abdominal mass    neg U/S 2010   SVD (spontaneous vaginal delivery)    x 2    Past Surgical History:  Procedure Laterality Date   APPENDECTOMY     BILATERAL SALPINGECTOMY  04/19/2014   Procedure: BILATERAL SALPINGECTOMY;  Surgeon: Daria Pastures, MD;  Location: [facility];  Service: Gynecology;;   BLADDER SUSPENSION N/A 04/19/2014   Procedure: TRANSVAGINAL TAPE (TVT) PROCEDURE;  Surgeon: Daria Pastures, MD;  Location: [facility];  Service: Gynecology;  Laterality: N/A;   COLONOSCOPY     CYSTOCELE REPAIR N/A  04/19/2014   Procedure: ANTERIOR REPAIR (CYSTOCELE);  Surgeon: Daria Pastures, MD;  Location: [facility];  Service: Gynecology;  Laterality: N/A;   CYSTOSCOPY N/A 04/19/2014   Procedure: CYSTOSCOPY;  Surgeon: Daria Pastures, MD;  Location: [facility];  Service: Gynecology;  Laterality: N/A;   TOTAL KNEE ARTHROPLASTY     VAGINAL HYSTERECTOMY N/A 04/19/2014   Procedure: HYSTERECTOMY VAGINAL;  Surgeon: Daria Pastures, MD;  Location: [facility];  Service: Gynecology;  Laterality: N/A;      Current Meds  Medication Sig   aspirin 81 MG tablet Take 81 mg by mouth daily.     Calcium Carbonate-Vitamin D (CALCIUM 600 + D PO) Take 1 tablet by mouth 2 (two) times daily.    digoxin (LANOXIN) 0.125 MG tablet Take 1 tablet daily   ergocalciferol (VITAMIN D2) 50000 UNITS capsule Take 50,000 Units by mouth every 14 (fourteen) days.    fenofibrate (TRICOR) 145 MG tablet Take 145 mg by mouth daily.   losartan (COZAAR) 25 MG tablet Take 25 mg by mouth daily.     metFORMIN (GLUCOPHAGE) 1000 MG tablet Take 1,000 mg by mouth 2 (two) times daily with a meal.   omeprazole (PRILOSEC) 20 MG capsule Take 20 mg by mouth as needed.    Probiotic Product (PROBIOTIC-10 PO) Take 1 capsule by mouth every other day.   rosuvastatin (CRESTOR) 5 MG tablet Take 1 tablet (5 mg total) by mouth daily.      Family History: The patient's family history includes Diabetes in an other family member; Heart disease in an other family member; Hyperlipidemia in an other family member; Hypertension in an other family member.   ROS:   Please see the history of present illness.     All other systems reviewed and are negative.   Labs/Other Tests and Data Reviewed:     Recent Labs: No results found for requested labs within last 8760 hours.  Recent Lipid Panel No results found for: CHOL, TRIG, HDL, CHOLHDL, VLDL, LDLCALC, LDLDIRECT    Exam:    Vital Signs:  BP 120/63    Pulse (!) 57    Wt 135 lb (61.2 kg)    BMI 22.47 kg/m     Wt Readings from Last 3 Encounters:  10/12/18 135 lb (61.2 kg)  07/09/18 132 lb (59.9 kg)  06/11/18 133 lb 3.2 oz (60.4 kg)     Well nourished, well developed in no acute distress. Alert awake in exam 3 looks very good.  Not in any distress she is talking to me from her living room.  I am in the office in [facility].  Diagnosis for this visit:   1. Paroxysmal SVT (supraventricular tachycardia) ([place])   2. Mixed hyperlipidemia   3. Type 2 diabetes mellitus  without complication, without long-term current use of insulin ([place])      ASSESSMENT & PLAN:    1.  Paroxysmal supraventricular tachycardia doing well from that point review only one episode I told her that some medication can be used to suppress this more efficiently however she says she is doing fine she prefers to simply monitoring the situation.  I told her if this became more frequent she need to let us know will act accordingly. 2.  Mixed dyslipidemia time to check her fasting lipid profile which I will order. 3.  Type 2 diabetes doing well from that point of view followed by internal medicine team.  COVID-19 Education: The signs and symptoms of COVID-19 were discussed with  the patient and how to seek care for testing (follow up with PCP or arrange E-visit).  The importance of social distancing was discussed today.  Patient Risk:   After full review of this patients clinical status, I feel that they are at least moderate risk at this time.  Time:   Today, I have spent 18 minutes with the patient with telehealth technology discussing pt health issues.  I spent 5 minutes reviewing her chart before the visit.  Visit was finished at 9:40 AM.    Medication Adjustments/Labs and Tests Ordered: Current medicines are reviewed at length with the patient today.  Concerns regarding medicines are outlined above.  No orders of the defined types were placed in this encounter.  Medication changes: No orders of the defined types were placed in this encounter.    Disposition: Follow-up in 5 months  Signed, Park Liter, MD, [facility] 10/12/2018 9:41 AM    [place]

## 2018-10-12 NOTE — Patient Instructions (Signed)
Medication Instructions:  Your physician recommends that you continue on your current medications as directed. Please refer to the Current Medication list given to you today.  If you need a refill on your cardiac medications before your next appointment, please call your pharmacy.   Lab work: None If you have labs (blood work) drawn today and your tests are completely normal, you will receive your results only by: . MyChart Message (if you have MyChart) OR . A paper copy in the mail If you have any lab test that is abnormal or we need to change your treatment, we will call you to review the results.  Testing/Procedures: None  Follow-Up: At [facility], you and your health needs are our priority.  As part of our continuing mission to provide you with exceptional heart care, we have created designated Provider Care Teams.  These Care Teams include your primary Cardiologist (physician) and Advanced Practice Providers (APPs -  Physician Assistants and Nurse Practitioners) who all work together to provide you with the care you need, when you need it. You will need a follow up appointment in 5 months Any Other Special Instructions Will Be Listed Below (If Applicable).    

## 2018-10-13 ENCOUNTER — Telehealth: Payer: Self-pay

## 2018-10-13 DIAGNOSIS — Z79899 Other long term (current) drug therapy: Secondary | ICD-10-CM | POA: Diagnosis not present

## 2018-10-13 DIAGNOSIS — Z5181 Encounter for therapeutic drug level monitoring: Secondary | ICD-10-CM | POA: Diagnosis not present

## 2018-10-13 DIAGNOSIS — E782 Mixed hyperlipidemia: Secondary | ICD-10-CM | POA: Diagnosis not present

## 2018-10-13 DIAGNOSIS — E119 Type 2 diabetes mellitus without complications: Secondary | ICD-10-CM | POA: Diagnosis not present

## 2018-10-13 DIAGNOSIS — I471 Supraventricular tachycardia: Secondary | ICD-10-CM | POA: Diagnosis not present

## 2018-10-13 DIAGNOSIS — D519 Vitamin B12 deficiency anemia, unspecified: Secondary | ICD-10-CM | POA: Diagnosis not present

## 2018-10-13 LAB — LIPID PANEL
Chol/HDL Ratio: 2.8 ratio (ref 0.0–4.4)
Cholesterol, Total: 132 mg/dL (ref 100–199)
HDL: 48 mg/dL (ref >39)
LDL Calculated: 72 mg/dL (ref 0–99)
Triglycerides: 62 mg/dL (ref 0–149)
VLDL Cholesterol Cal: 12 mg/dL (ref 5–40)

## 2018-10-13 LAB — DIGOXIN LEVEL: Digoxin, Serum: 0.6 ng/mL (ref 0.5–0.9)

## 2018-10-13 NOTE — Addendum Note (Signed)
Addended by: Tarri Glenn on: 10/13/2018 10:10 AM   Modules accepted: Orders

## 2018-10-13 NOTE — Addendum Note (Signed)
Addended by: Tarri Glenn on: 10/13/2018 11:18 AM   Modules accepted: Orders

## 2018-10-13 NOTE — Telephone Encounter (Signed)
Talked to dr Agustin Cree via skype and he ordered labs for lipid and digoxin levels to be drawn today

## 2018-11-01 ENCOUNTER — Other Ambulatory Visit: Payer: Self-pay | Admitting: Cardiology

## 2018-11-01 NOTE — Telephone Encounter (Signed)
Rx refill sent to pharmacy. 

## 2018-11-01 NOTE — Telephone Encounter (Signed)
°*  STAT* If patient is at the pharmacy, call can be transferred to refill team.   1. Which medications need to be refilled? (please list name of each medication and dose if known) Rosuvastatin cal 5mg  tablets  2. Which pharmacy/location (including street and city if local pharmacy) is medication to be sent to?[facility] [address] [place]  3. Do they need a 30 day or 90 day supply? [place]

## 2018-11-02 MED ORDER — ROSUVASTATIN CALCIUM 5 MG PO TABS: 5.0000 mg | ORAL_TABLET | Freq: Every day | ORAL | 1 refills | Status: DC

## 2018-11-11 DIAGNOSIS — Z1231 Encounter for screening mammogram for malignant neoplasm of breast: Secondary | ICD-10-CM | POA: Diagnosis not present

## 2018-11-16 DIAGNOSIS — M25562 Pain in left knee: Secondary | ICD-10-CM | POA: Diagnosis not present

## 2018-11-16 DIAGNOSIS — Z96652 Presence of left artificial knee joint: Secondary | ICD-10-CM | POA: Diagnosis not present

## 2018-11-16 DIAGNOSIS — D519 Vitamin B12 deficiency anemia, unspecified: Secondary | ICD-10-CM | POA: Diagnosis not present

## 2018-11-18 DIAGNOSIS — M25562 Pain in left knee: Secondary | ICD-10-CM | POA: Diagnosis not present

## 2018-11-18 DIAGNOSIS — M25552 Pain in left hip: Secondary | ICD-10-CM | POA: Diagnosis not present

## 2018-11-18 DIAGNOSIS — M79605 Pain in left leg: Secondary | ICD-10-CM | POA: Diagnosis not present

## 2018-11-22 DIAGNOSIS — M25552 Pain in left hip: Secondary | ICD-10-CM | POA: Diagnosis not present

## 2018-11-22 DIAGNOSIS — M79605 Pain in left leg: Secondary | ICD-10-CM | POA: Diagnosis not present

## 2018-11-22 DIAGNOSIS — M25562 Pain in left knee: Secondary | ICD-10-CM | POA: Diagnosis not present

## 2018-11-24 DIAGNOSIS — N390 Urinary tract infection, site not specified: Secondary | ICD-10-CM | POA: Diagnosis not present

## 2018-11-24 DIAGNOSIS — Z6822 Body mass index (BMI) 22.0-22.9, adult: Secondary | ICD-10-CM | POA: Diagnosis not present

## 2018-11-26 DIAGNOSIS — M79605 Pain in left leg: Secondary | ICD-10-CM | POA: Diagnosis not present

## 2018-11-26 DIAGNOSIS — M25552 Pain in left hip: Secondary | ICD-10-CM | POA: Diagnosis not present

## 2018-11-26 DIAGNOSIS — M25562 Pain in left knee: Secondary | ICD-10-CM | POA: Diagnosis not present

## 2018-11-26 DIAGNOSIS — E119 Type 2 diabetes mellitus without complications: Secondary | ICD-10-CM | POA: Diagnosis not present

## 2018-11-30 DIAGNOSIS — M25552 Pain in left hip: Secondary | ICD-10-CM | POA: Diagnosis not present

## 2018-11-30 DIAGNOSIS — M79605 Pain in left leg: Secondary | ICD-10-CM | POA: Diagnosis not present

## 2018-11-30 DIAGNOSIS — M25562 Pain in left knee: Secondary | ICD-10-CM | POA: Diagnosis not present

## 2018-12-03 DIAGNOSIS — M79605 Pain in left leg: Secondary | ICD-10-CM | POA: Diagnosis not present

## 2018-12-03 DIAGNOSIS — M25562 Pain in left knee: Secondary | ICD-10-CM | POA: Diagnosis not present

## 2018-12-03 DIAGNOSIS — M25552 Pain in left hip: Secondary | ICD-10-CM | POA: Diagnosis not present

## 2018-12-07 DIAGNOSIS — R309 Painful micturition, unspecified: Secondary | ICD-10-CM | POA: Diagnosis not present

## 2018-12-14 DIAGNOSIS — R309 Painful micturition, unspecified: Secondary | ICD-10-CM | POA: Diagnosis not present

## 2018-12-14 DIAGNOSIS — R102 Pelvic and perineal pain: Secondary | ICD-10-CM | POA: Diagnosis not present

## 2018-12-15 DIAGNOSIS — D519 Vitamin B12 deficiency anemia, unspecified: Secondary | ICD-10-CM | POA: Diagnosis not present

## 2018-12-20 DIAGNOSIS — E785 Hyperlipidemia, unspecified: Secondary | ICD-10-CM | POA: Diagnosis not present

## 2018-12-20 DIAGNOSIS — Z23 Encounter for immunization: Secondary | ICD-10-CM | POA: Diagnosis not present

## 2018-12-20 DIAGNOSIS — E1129 Type 2 diabetes mellitus with other diabetic kidney complication: Secondary | ICD-10-CM | POA: Diagnosis not present

## 2018-12-20 DIAGNOSIS — F419 Anxiety disorder, unspecified: Secondary | ICD-10-CM | POA: Diagnosis not present

## 2018-12-20 DIAGNOSIS — Z6822 Body mass index (BMI) 22.0-22.9, adult: Secondary | ICD-10-CM | POA: Diagnosis not present

## 2019-01-10 DIAGNOSIS — D519 Vitamin B12 deficiency anemia, unspecified: Secondary | ICD-10-CM | POA: Diagnosis not present

## 2019-01-31 ENCOUNTER — Telehealth: Payer: Self-pay | Admitting: Cardiology

## 2019-01-31 MED ORDER — ROSUVASTATIN CALCIUM 5 MG PO TABS: 5.0000 mg | ORAL_TABLET | Freq: Every day | ORAL | 1 refills | Status: DC

## 2019-01-31 MED ORDER — DIGOXIN 125 MCG PO TABS: ORAL_TABLET | ORAL | 1 refills | Status: DC

## 2019-01-31 NOTE — Telephone Encounter (Signed)
Call digoxin and rosuvastatin to [facility] [place]

## 2019-01-31 NOTE — Telephone Encounter (Signed)
Crestor 5 mg daily refilled Digoxin 0.125 mg daily refilled.

## 2019-01-31 NOTE — Addendum Note (Signed)
Addended by: Ashok Norris on: 01/31/2019 03:09 PM   Modules accepted: Orders

## 2019-02-16 DIAGNOSIS — D519 Vitamin B12 deficiency anemia, unspecified: Secondary | ICD-10-CM | POA: Diagnosis not present

## 2019-03-14 ENCOUNTER — Ambulatory Visit: Payer: PPO | Admitting: Cardiology

## 2019-03-17 DIAGNOSIS — D519 Vitamin B12 deficiency anemia, unspecified: Secondary | ICD-10-CM | POA: Diagnosis not present

## 2019-03-21 ENCOUNTER — Other Ambulatory Visit: Payer: Self-pay

## 2019-03-21 ENCOUNTER — Encounter: Payer: Self-pay | Admitting: Cardiology

## 2019-03-21 ENCOUNTER — Ambulatory Visit (INDEPENDENT_AMBULATORY_CARE_PROVIDER_SITE_OTHER): Payer: PPO | Admitting: Cardiology

## 2019-03-21 VITALS — BP 126/68 | HR 68 | Ht 65.0 in | Wt 140.4 lb

## 2019-03-21 DIAGNOSIS — I471 Supraventricular tachycardia: Secondary | ICD-10-CM

## 2019-03-21 DIAGNOSIS — E782 Mixed hyperlipidemia: Secondary | ICD-10-CM | POA: Diagnosis not present

## 2019-03-21 DIAGNOSIS — E119 Type 2 diabetes mellitus without complications: Secondary | ICD-10-CM

## 2019-03-21 MED ORDER — DILTIAZEM HCL ER COATED BEADS 120 MG PO CP24: 120.0000 mg | ORAL_CAPSULE | Freq: Every day | ORAL | 2 refills | Status: DC

## 2019-03-21 NOTE — Addendum Note (Signed)
Addended by: Linna Hoff R on: 03/21/2019 11:08 AM   Modules accepted: Orders

## 2019-03-21 NOTE — Progress Notes (Addendum)
Cardiology Office Note:    Date:  03/21/2019   ID:  Robin, Castro Jul 17, 1940, MRN [id number]  PCP:  Angelina Sheriff, MD  Cardiologist:  Jenne Campus, MD    Referring MD: Angelina Sheriff, MD   Chief Complaint  Patient presents with  . Follow-up  I still have some palpitations  History of Present Illness:    Robin Castro is a 78 y.o. female with history of paroxysmal supraventricular tachycardia.  She was taking digoxin for more than 50 years however recently the level of digoxin was elevated and we cut down in half since that time she did have 2 episode of palpitations.  She would like to change this medication to something different.  Another complaint that she is having is the fact that she feels flashes like a hot flash.  Suddenly she would start getting warmth sensation coming from inside.  It would last for few minutes.  It really bothers her a lot she would like to find out what the problem seems to be.  Denies have any chest pain, tightness, pressure, burning in chest, shortness of breath.  Past Medical History:  Diagnosis Date  . Chest discomfort    nl myoview 2010  . Diabetes mellitus    type 2- x 12 years  . Dysrhythmia    h/o rapid heart beat  . GERD (gastroesophageal reflux disease)   . Hypercholesteremia   . Palpitations   . PONV (postoperative nausea and vomiting)   . Pulsatile abdominal mass    neg U/S 2010  . SVD (spontaneous vaginal delivery)    x 2    Past Surgical History:  Procedure Laterality Date  . APPENDECTOMY    . BILATERAL SALPINGECTOMY  04/19/2014   Procedure: BILATERAL SALPINGECTOMY;  Surgeon: Daria Pastures, MD;  Location: [facility];  Service: Gynecology;;  . BLADDER SUSPENSION N/A 04/19/2014   Procedure: TRANSVAGINAL TAPE (TVT) PROCEDURE;  Surgeon: Daria Pastures, MD;  Location: [facility];  Service: Gynecology;  Laterality: N/A;  . COLONOSCOPY    . CYSTOCELE REPAIR N/A 04/19/2014   Procedure: ANTERIOR REPAIR  (CYSTOCELE);  Surgeon: Daria Pastures, MD;  Location: [facility];  Service: Gynecology;  Laterality: N/A;  . CYSTOSCOPY N/A 04/19/2014   Procedure: CYSTOSCOPY;  Surgeon: Daria Pastures, MD;  Location: [facility];  Service: Gynecology;  Laterality: N/A;  . TOTAL KNEE ARTHROPLASTY    . VAGINAL HYSTERECTOMY N/A 04/19/2014   Procedure: HYSTERECTOMY VAGINAL;  Surgeon: Daria Pastures, MD;  Location: [facility];  Service: Gynecology;  Laterality: N/A;    Current Medications: Current Meds  Medication Sig  . aspirin 81 MG tablet Take 81 mg by mouth daily.    . Calcium Carbonate-Vitamin D (CALCIUM 600 + D PO) Take 1 tablet by mouth 2 (two) times daily.   . digoxin (LANOXIN) 0.125 MG tablet Take 1 tablet daily  . ergocalciferol (VITAMIN D2) 50000 UNITS capsule Take 50,000 Units by mouth every 14 (fourteen) days.   . fenofibrate (TRICOR) 145 MG tablet Take 145 mg by mouth daily.  Marland Kitchen losartan (COZAAR) 25 MG tablet Take 25 mg by mouth daily.    . metFORMIN (GLUCOPHAGE) 1000 MG tablet Take 1,000 mg by mouth 2 (two) times daily with a meal.  . omeprazole (PRILOSEC) 20 MG capsule Take 20 mg by mouth as needed.   . Probiotic Product (PROBIOTIC-10 PO) Take 1 capsule by mouth every other day.  . rosuvastatin (CRESTOR) 5 MG tablet Take 1  tablet (5 mg total) by mouth daily.     Allergies:   Lansoprazole, Statins, Enalapril, and Niacin and related   Social History   Socioeconomic History  . Marital status: Widowed    Spouse name: Not on file  . Number of children: 2  . Years of education: Not on file  . Highest education level: Not on file  Occupational History  . Occupation: retired IT trainer  Tobacco Use  . Smoking status: Never Smoker  . Smokeless tobacco: Never Used  Substance and Sexual Activity  . Alcohol use: No  . Drug use: No  . Sexual activity: Yes    Birth control/protection: Post-menopausal  Other Topics Concern  . Not on file  Social History Narrative  . Not on file   Social  Determinants of Health   Financial Resource Strain:   . Difficulty of Paying Living Expenses: Not on file  Food Insecurity:   . Worried About Charity fundraiser in the Last Year: Not on file  . Ran Out of Food in the Last Year: Not on file  Transportation Needs:   . Lack of Transportation (Medical): Not on file  . Lack of Transportation (Non-Medical): Not on file  Physical Activity:   . Days of Exercise per Week: Not on file  . Minutes of Exercise per Session: Not on file  Stress:   . Feeling of Stress : Not on file  Social Connections:   . Frequency of Communication with Friends and Family: Not on file  . Frequency of Social Gatherings with Friends and Family: Not on file  . Attends Religious Services: Not on file  . Active Member of Clubs or Organizations: Not on file  . Attends Archivist Meetings: Not on file  . Marital Status: Not on file     Family History: The patient's family history includes Diabetes in an other family member; Heart disease in an other family member; Hyperlipidemia in an other family member; Hypertension in an other family member. ROS:   Please see the history of present illness.    All 14 point review of systems negative except as described per history of present illness  EKGs/Labs/Other Studies Reviewed:      Recent Labs: No results found for requested labs within last 8760 hours.  Recent Lipid Panel    Component Value Date/Time   CHOL 132 10/13/2018 1122   TRIG 62 10/13/2018 1122   HDL 48 10/13/2018 1122   CHOLHDL 2.8 10/13/2018 1122   LDLCALC 72 10/13/2018 1122    Physical Exam:    VS:  BP 126/68   Pulse 68   Ht 5\' 5"  (1.651 m)   Wt 140 lb 6.4 oz (63.7 kg)   SpO2 97%   BMI 23.36 kg/m     Wt Readings from Last 3 Encounters:  03/21/19 140 lb 6.4 oz (63.7 kg)  10/12/18 135 lb (61.2 kg)  07/09/18 132 lb (59.9 kg)     GEN:  Well nourished, well developed in no acute distress HEENT: Normal NECK: No JVD; No carotid  bruits LYMPHATICS: No lymphadenopathy CARDIAC: RRR, no murmurs, no rubs, no gallops RESPIRATORY:  Clear to auscultation without rales, wheezing or rhonchi  ABDOMEN: Soft, non-tender, non-distended MUSCULOSKELETAL:  No edema; No deformity  SKIN: Warm and dry LOWER EXTREMITIES: no swelling NEUROLOGIC:  Alert and oriented x 3 PSYCHIATRIC:  Normal affect   ASSESSMENT:    1. Paroxysmal SVT (supraventricular tachycardia) ([place])   2. Type 2 diabetes mellitus  without complication, without long-term current use of insulin ([place])   3. Mixed hyperlipidemia    PLAN:    In order of problems listed above:  1. Paroxysmal supraventricular tachycardia.  We will stop her digoxin we will start her on metoprolol succinate 25 mg daily.  First we will do EKG to make sure it safe for [place] to do it. 2. Type 2 diabetes stable followed by 10 medicine team. 3. 3.  Dyslipidemia: That being followed by primary care physician call the office to get fasting lipid profile. 4. Hot flashes.  We will check a TSH.  I also recommend to see primary care physician regarding that issues. EKG showed sinus rhythm rate 61, normal P interval, right bundle branch block.  I will put her on Cardizem CD 120 rather than metoprolol succinate.  Medication Adjustments/Labs and Tests Ordered: Current medicines are reviewed at length with the patient today.  Concerns regarding medicines are outlined above.  No orders of the defined types were placed in this encounter.  Medication changes: No orders of the defined types were placed in this encounter.   Signed, Park Liter, MD, [facility] 03/21/2019 10:41 AM    [place]

## 2019-03-21 NOTE — Patient Instructions (Signed)
Medication Instructions:  Your physician has recommended you make the following change in your medication:   STOP: Digoxin   START: Diltiazem 120 mg daily   *If you need a refill on your cardiac medications before your next appointment, please call your pharmacy*  Lab Work: Your physician recommends that you return for lab work today: tsh, lipids   If you have labs (blood work) drawn today and your tests are completely normal, you will receive your results only by: Marland Kitchen MyChart Message (if you have MyChart) OR . A paper copy in the mail If you have any lab test that is abnormal or we need to change your treatment, we will call you to review the results.  Testing/Procedures: None.   Follow-Up: At [facility], you and your health needs are our priority.  As part of our continuing mission to provide you with exceptional heart care, we have created designated Provider Care Teams.  These Care Teams include your primary Cardiologist (physician) and Advanced Practice Providers (APPs -  Physician Assistants and Nurse Practitioners) who all work together to provide you with the care you need, when you need it.  Your next appointment:   3 month(s)  The format for your next appointment:   In Person  Provider:   Jenne Campus, MD  Other Instructions  Diltiazem extended-release capsules or tablets What is this medicine? DILTIAZEM (dil TYE a zem) is a calcium-channel blocker. It affects the amount of calcium found in your heart and muscle cells. This relaxes your blood vessels, which can reduce the amount of work the heart has to do. This medicine is used to treat high blood pressure and chest pain caused by angina. This medicine may be used for other purposes; ask your health care provider or pharmacist if you have questions. COMMON BRAND NAME(S): Cardizem CD, Cardizem LA, Cardizem SR, Cartia XT, Dilacor XR, Dilt-CD, Diltia XT, Diltzac, Matzim LA, Rema Fendt, TIADYLT ER, Tiamate,  Tiazac What should I tell my health care provider before I take this medicine? They need to know if you have any of these conditions:  heart problems, low blood pressure, irregular heartbeat  liver disease  previous heart attack  an unusual or allergic reaction to diltiazem, other medicines, foods, dyes, or preservatives  pregnant or trying to get pregnant  breast-feeding How should I use this medicine? Take this medicine by mouth with a glass of water. Follow the directions on the prescription label. Swallow whole, do not crush or chew. Ask your doctor or pharmacist if your should take this medicine with food. Take your doses at regular intervals. Do not take your medicine more often then directed. Do not stop taking except on the advice of your doctor or health care professional. Ask your doctor or health care professional how to gradually reduce the dose. Talk to your pediatrician regarding the use of this medicine in children. Special care may be needed. Overdosage: If you think you have taken too much of this medicine contact a poison control center or emergency room at once. NOTE: This medicine is only for you. Do not share this medicine with others. What if I miss a dose? If you miss a dose, take it as soon as you can. If it is almost time for your next dose, take only that dose. Do not take double or extra doses. What may interact with this medicine? Do not take this medicine with any of the following medications:  cisapride  hawthorn  pimozide  ranolazine  red yeast rice This medicine may also interact with the following medications:  buspirone  carbamazepine  cimetidine  cyclosporine  digoxin  local anesthetics or general anesthetics  lovastatin  medicines for anxiety or difficulty sleeping like midazolam and triazolam  medicines for high blood pressure or heart problems  quinidine  rifampin, rifabutin, or rifapentine This list may not describe all  possible interactions. Give your health care provider a list of all the medicines, herbs, non-prescription drugs, or dietary supplements you use. Also tell them if you smoke, drink alcohol, or use illegal drugs. Some items may interact with your medicine. What should I watch for while using this medicine? Check your blood pressure and pulse rate regularly. Ask your doctor or health care professional what your blood pressure and pulse rate should be and when you should contact him or her. You may feel dizzy or lightheaded. Do not drive, use machinery, or do anything that needs mental alertness until you know how this medicine affects you. To reduce the risk of dizzy or fainting spells, do not sit or stand up quickly, especially if you are an older patient. Alcohol can make you more dizzy or increase flushing and rapid heartbeats. Avoid alcoholic drinks. What side effects may I notice from receiving this medicine? Side effects that you should report to your doctor or health care professional as soon as possible:  allergic reactions like skin rash, itching or hives, swelling of the face, lips, or tongue  confusion, mental depression  feeling faint or lightheaded, falls  redness, blistering, peeling or loosening of the skin, including inside the mouth  slow, irregular heartbeat  swelling of the feet and ankles  unusual bleeding or bruising, pinpoint red spots on the skin Side effects that usually do not require medical attention (report to your doctor or health care professional if they continue or are bothersome):  constipation or diarrhea  difficulty sleeping  facial flushing  headache  nausea, vomiting  sexual dysfunction  weak or tired This list may not describe all possible side effects. Call your doctor for medical advice about side effects. You may report side effects to FDA at 1-800-FDA-1088. Where should I keep my medicine? Keep out of the reach of children. Store at room  temperature between 15 and 30 degrees C (59 and 86 degrees F). Protect from humidity. Throw away any unused medicine after the expiration date. NOTE: This sheet is a summary. It may not cover all possible information. If you have questions about this medicine, talk to your doctor, pharmacist, or health care provider.  2020 Elsevier/Gold Standard (2007-07-15 14:35:47)

## 2019-03-22 DIAGNOSIS — E782 Mixed hyperlipidemia: Secondary | ICD-10-CM | POA: Diagnosis not present

## 2019-03-22 DIAGNOSIS — I471 Supraventricular tachycardia: Secondary | ICD-10-CM | POA: Diagnosis not present

## 2019-03-22 LAB — LIPID PANEL
Chol/HDL Ratio: 3.1 ratio (ref 0.0–4.4)
Cholesterol, Total: 150 mg/dL (ref 100–199)
HDL: 49 mg/dL (ref >39)
LDL Chol Calc (NIH): 85 mg/dL (ref 0–99)
Triglycerides: 87 mg/dL (ref 0–149)
VLDL Cholesterol Cal: 16 mg/dL (ref 5–40)

## 2019-03-22 LAB — TSH: TSH: 4.59 u[IU]/mL — ABNORMAL HIGH (ref 0.450–4.500)

## 2019-04-15 DIAGNOSIS — M810 Age-related osteoporosis without current pathological fracture: Secondary | ICD-10-CM | POA: Diagnosis not present

## 2019-04-15 DIAGNOSIS — R0989 Other specified symptoms and signs involving the circulatory and respiratory systems: Secondary | ICD-10-CM | POA: Diagnosis not present

## 2019-04-15 DIAGNOSIS — E7800 Pure hypercholesterolemia, unspecified: Secondary | ICD-10-CM | POA: Diagnosis not present

## 2019-04-15 DIAGNOSIS — D510 Vitamin B12 deficiency anemia due to intrinsic factor deficiency: Secondary | ICD-10-CM | POA: Diagnosis not present

## 2019-04-15 DIAGNOSIS — Z6823 Body mass index (BMI) 23.0-23.9, adult: Secondary | ICD-10-CM | POA: Diagnosis not present

## 2019-04-15 DIAGNOSIS — E559 Vitamin D deficiency, unspecified: Secondary | ICD-10-CM | POA: Diagnosis not present

## 2019-04-15 DIAGNOSIS — Z79899 Other long term (current) drug therapy: Secondary | ICD-10-CM | POA: Diagnosis not present

## 2019-04-15 DIAGNOSIS — E1129 Type 2 diabetes mellitus with other diabetic kidney complication: Secondary | ICD-10-CM | POA: Diagnosis not present

## 2019-04-19 ENCOUNTER — Telehealth: Payer: Self-pay | Admitting: Emergency Medicine

## 2019-04-19 DIAGNOSIS — R0989 Other specified symptoms and signs involving the circulatory and respiratory systems: Secondary | ICD-10-CM

## 2019-04-19 NOTE — Telephone Encounter (Signed)
Spoke to patient informed her per Dr. Agustin Cree he would like her to have a carotid US, advised her I will get the appointment and call her back with this.

## 2019-04-20 DIAGNOSIS — N132 Hydronephrosis with renal and ureteral calculous obstruction: Secondary | ICD-10-CM | POA: Diagnosis not present

## 2019-04-20 DIAGNOSIS — R1031 Right lower quadrant pain: Secondary | ICD-10-CM | POA: Diagnosis not present

## 2019-04-21 DIAGNOSIS — E119 Type 2 diabetes mellitus without complications: Secondary | ICD-10-CM | POA: Diagnosis not present

## 2019-04-21 DIAGNOSIS — Z79899 Other long term (current) drug therapy: Secondary | ICD-10-CM | POA: Diagnosis not present

## 2019-04-21 DIAGNOSIS — N201 Calculus of ureter: Secondary | ICD-10-CM | POA: Diagnosis not present

## 2019-04-21 DIAGNOSIS — Z7984 Long term (current) use of oral hypoglycemic drugs: Secondary | ICD-10-CM | POA: Diagnosis not present

## 2019-04-21 DIAGNOSIS — R109 Unspecified abdominal pain: Secondary | ICD-10-CM | POA: Diagnosis not present

## 2019-04-21 DIAGNOSIS — Z7982 Long term (current) use of aspirin: Secondary | ICD-10-CM | POA: Diagnosis not present

## 2019-04-21 DIAGNOSIS — K219 Gastro-esophageal reflux disease without esophagitis: Secondary | ICD-10-CM | POA: Diagnosis not present

## 2019-04-21 DIAGNOSIS — R1031 Right lower quadrant pain: Secondary | ICD-10-CM | POA: Diagnosis not present

## 2019-04-21 DIAGNOSIS — I10 Essential (primary) hypertension: Secondary | ICD-10-CM | POA: Diagnosis not present

## 2019-04-21 DIAGNOSIS — N132 Hydronephrosis with renal and ureteral calculous obstruction: Secondary | ICD-10-CM | POA: Diagnosis not present

## 2019-04-21 NOTE — Telephone Encounter (Signed)
Left message with  appointment date and time for carotid US.

## 2019-04-27 DIAGNOSIS — Z6822 Body mass index (BMI) 22.0-22.9, adult: Secondary | ICD-10-CM | POA: Diagnosis not present

## 2019-04-27 DIAGNOSIS — N200 Calculus of kidney: Secondary | ICD-10-CM | POA: Diagnosis not present

## 2019-04-27 DIAGNOSIS — R110 Nausea: Secondary | ICD-10-CM | POA: Diagnosis not present

## 2019-04-28 DIAGNOSIS — N201 Calculus of ureter: Secondary | ICD-10-CM | POA: Diagnosis not present

## 2019-04-28 DIAGNOSIS — N3020 Other chronic cystitis without hematuria: Secondary | ICD-10-CM | POA: Diagnosis not present

## 2019-05-12 DIAGNOSIS — N201 Calculus of ureter: Secondary | ICD-10-CM | POA: Diagnosis not present

## 2019-05-12 DIAGNOSIS — N3020 Other chronic cystitis without hematuria: Secondary | ICD-10-CM | POA: Diagnosis not present

## 2019-05-16 DIAGNOSIS — E538 Deficiency of other specified B group vitamins: Secondary | ICD-10-CM | POA: Diagnosis not present

## 2019-06-08 ENCOUNTER — Other Ambulatory Visit: Payer: Self-pay

## 2019-06-08 ENCOUNTER — Ambulatory Visit (INDEPENDENT_AMBULATORY_CARE_PROVIDER_SITE_OTHER): Payer: PPO

## 2019-06-08 DIAGNOSIS — R0989 Other specified symptoms and signs involving the circulatory and respiratory systems: Secondary | ICD-10-CM | POA: Diagnosis not present

## 2019-06-08 MED ORDER — DILTIAZEM HCL ER COATED BEADS 120 MG PO CP24: 120.0000 mg | ORAL_CAPSULE | Freq: Every day | ORAL | 3 refills | Status: DC

## 2019-06-08 NOTE — Progress Notes (Unsigned)
Carotid duplex exam has been performed. Right ICA stenosis was exaluated.  Jimmy Treylin Burtch RDCS, RVT

## 2019-06-14 DIAGNOSIS — D519 Vitamin B12 deficiency anemia, unspecified: Secondary | ICD-10-CM | POA: Diagnosis not present

## 2019-07-05 ENCOUNTER — Ambulatory Visit (INDEPENDENT_AMBULATORY_CARE_PROVIDER_SITE_OTHER): Payer: PPO | Admitting: Cardiology

## 2019-07-05 ENCOUNTER — Encounter: Payer: Self-pay | Admitting: Cardiology

## 2019-07-05 ENCOUNTER — Other Ambulatory Visit: Payer: Self-pay

## 2019-07-05 VITALS — BP 128/70 | HR 64 | Ht 65.0 in | Wt 141.0 lb

## 2019-07-05 DIAGNOSIS — I471 Supraventricular tachycardia: Secondary | ICD-10-CM | POA: Diagnosis not present

## 2019-07-05 DIAGNOSIS — E119 Type 2 diabetes mellitus without complications: Secondary | ICD-10-CM | POA: Diagnosis not present

## 2019-07-05 DIAGNOSIS — E782 Mixed hyperlipidemia: Secondary | ICD-10-CM

## 2019-07-05 DIAGNOSIS — I739 Peripheral vascular disease, unspecified: Secondary | ICD-10-CM

## 2019-07-05 HISTORY — DX: Peripheral vascular disease, unspecified: I73.9

## 2019-07-05 NOTE — Patient Instructions (Signed)

## 2019-07-05 NOTE — Progress Notes (Signed)
Cardiology Office Note:    Date:  07/05/2019   ID:  Robin Castro 04-30-1940, MRN [id number]  PCP:  Angelina Sheriff, MD  Cardiologist:  Jenne Campus, MD    Referring MD: Angelina Sheriff, MD   Chief Complaint  Patient presents with  . Follow-up    3 Months and Discuss test results  Had only 1 episodes of palpitations since I seen her last time  History of Present Illness:    Robin Castro is a 79 y.o. female with past medical history significant for paroxysmal supraventricular tachycardia, initially controlled with digoxin however her digoxin level was high digoxin was discontinued and she was switched to Cardizem, also recently discovered peripheral vascular disease with up to 60% stenosis right internal carotid artery, dyslipidemia, diabetes.  She comes today to talk about her issues.  Follow-up clinically she is doing well.  She is concerned about finding in the right coronary artery.  What triggered this investigation is the sensation of hot flushing that she has.  However this is overall getting better.  In terms of arrhythmia she did have one episode of palpitations since I seen her last time.  And she thinks she already on that day and that is what triggered it.  She did not have any passing out she did not have any tightness in the chest episode lasted about 6 minutes.  Past Medical History:  Diagnosis Date  . Chest discomfort    nl myoview 2010  . Diabetes mellitus    type 2- x 12 years  . Dysrhythmia    h/o rapid heart beat  . GERD (gastroesophageal reflux disease)   . Hypercholesteremia   . Palpitations   . PONV (postoperative nausea and vomiting)   . Pulsatile abdominal mass    neg U/S 2010  . SVD (spontaneous vaginal delivery)    x 2    Past Surgical History:  Procedure Laterality Date  . APPENDECTOMY    . BILATERAL SALPINGECTOMY  04/19/2014   Procedure: BILATERAL SALPINGECTOMY;  Surgeon: Daria Pastures, MD;  Location: [facility];   Service: Gynecology;;  . BLADDER SUSPENSION N/A 04/19/2014   Procedure: TRANSVAGINAL TAPE (TVT) PROCEDURE;  Surgeon: Daria Pastures, MD;  Location: [facility];  Service: Gynecology;  Laterality: N/A;  . COLONOSCOPY    . CYSTOCELE REPAIR N/A 04/19/2014   Procedure: ANTERIOR REPAIR (CYSTOCELE);  Surgeon: Daria Pastures, MD;  Location: [facility];  Service: Gynecology;  Laterality: N/A;  . CYSTOSCOPY N/A 04/19/2014   Procedure: CYSTOSCOPY;  Surgeon: Daria Pastures, MD;  Location: [facility];  Service: Gynecology;  Laterality: N/A;  . TOTAL KNEE ARTHROPLASTY    . VAGINAL HYSTERECTOMY N/A 04/19/2014   Procedure: HYSTERECTOMY VAGINAL;  Surgeon: Daria Pastures, MD;  Location: [facility];  Service: Gynecology;  Laterality: N/A;    Current Medications: Current Meds  Medication Sig  . aspirin 81 MG tablet Take 81 mg by mouth daily.    . Calcium Carbonate-Vitamin D (CALCIUM 600 + D PO) Take 1 tablet by mouth 2 (two) times daily.   Marland Kitchen diltiazem (CARDIZEM CD) 120 MG 24 hr capsule Take 1 capsule (120 mg total) by mouth daily.  . ergocalciferol (VITAMIN D2) 50000 UNITS capsule Take 50,000 Units by mouth every 14 (fourteen) days.   . fenofibrate (TRICOR) 145 MG tablet Take 145 mg by mouth daily.  Marland Kitchen losartan (COZAAR) 25 MG tablet Take 25 mg by mouth daily.    . metFORMIN (GLUCOPHAGE) 1000  MG tablet Take 1,000 mg by mouth 2 (two) times daily with a meal.  . omeprazole (PRILOSEC) 20 MG capsule Take 20 mg by mouth as needed.   . rosuvastatin (CRESTOR) 5 MG tablet Take 1 tablet (5 mg total) by mouth daily.     Allergies:   Lansoprazole, Statins, Enalapril, and Niacin and related   Social History   Socioeconomic History  . Marital status: Widowed    Spouse name: Not on file  . Number of children: 2  . Years of education: Not on file  . Highest education level: Not on file  Occupational History  . Occupation: retired IT trainer  Tobacco Use  . Smoking status: Never Smoker  . Smokeless tobacco: Never  Used  Substance and Sexual Activity  . Alcohol use: No  . Drug use: No  . Sexual activity: Yes    Birth control/protection: Post-menopausal  Other Topics Concern  . Not on file  Social History Narrative  . Not on file   Social Determinants of Health   Financial Resource Strain:   . Difficulty of Paying Living Expenses:   Food Insecurity:   . Worried About Charity fundraiser in the Last Year:   . Arboriculturist in the Last Year:   Transportation Needs:   . Film/video editor (Medical):   Marland Kitchen Lack of Transportation (Non-Medical):   Physical Activity:   . Days of Exercise per Week:   . Minutes of Exercise per Session:   Stress:   . Feeling of Stress :   Social Connections:   . Frequency of Communication with Friends and Family:   . Frequency of Social Gatherings with Friends and Family:   . Attends Religious Services:   . Active Member of Clubs or Organizations:   . Attends Archivist Meetings:   Marland Kitchen Marital Status:      Family History: The patient's family history includes Diabetes in an other family member; Heart disease in an other family member; Hyperlipidemia in an other family member; Hypertension in an other family member. ROS:   Please see the history of present illness.    All 14 point review of systems negative except as described per history of present illness  EKGs/Labs/Other Studies Reviewed:      Recent Labs: 03/22/2019: TSH 4.590  Recent Lipid Panel    Component Value Date/Time   CHOL 150 03/22/2019 0811   TRIG 87 03/22/2019 0811   HDL 49 03/22/2019 0811   CHOLHDL 3.1 03/22/2019 0811   LDLCALC 85 03/22/2019 0811    Physical Exam:    VS:  BP 128/70   Pulse 64   Ht 5\' 5"  (1.651 m)   Wt 141 lb (64 kg)   SpO2 98%   BMI 23.46 kg/m     Wt Readings from Last 3 Encounters:  07/05/19 141 lb (64 kg)  03/21/19 140 lb 6.4 oz (63.7 kg)  10/12/18 135 lb (61.2 kg)     GEN:  Well nourished, well developed in no acute distress HEENT:  Normal NECK: No JVD; No carotid bruits LYMPHATICS: No lymphadenopathy CARDIAC: RRR, no murmurs, no rubs, no gallops RESPIRATORY:  Clear to auscultation without rales, wheezing or rhonchi  ABDOMEN: Soft, non-tender, non-distended MUSCULOSKELETAL:  No edema; No deformity  SKIN: Warm and dry LOWER EXTREMITIES: no swelling NEUROLOGIC:  Alert and oriented x 3 PSYCHIATRIC:  Normal affect   ASSESSMENT:    1. Paroxysmal SVT (supraventricular tachycardia) ([place])   2. Peripheral vascular disease,  unspecified ([place]) up to 60% right internal carotid artery   3. Type 2 diabetes mellitus without complication, without long-term current use of insulin ([place])   4. Mixed hyperlipidemia    PLAN:    In order of problems listed above:  1. Supraventricular tachycardia she had 1 episodes of palpitations since I seen her last time.  She is suppressed with Cardizem CD 120 daily.  I wanted to increase dose to 180, however, she prefers to wait and see how things will go.  We will continue monitoring them. 2. Peripheral vascular disease this is a new diagnosis.  Up to 60%.  There is no recent stroke, therefore, there is no need to intervene at the moment.  Decreased risk factors modifications.  We already have her LDL of 72 with a Crestor 5 which I will continue.  She is on aspirin that I will continue. 3. Type 2 diabetes.  Stable her last hemoglobin A1c and I did review K began to get that number was 7.1 which was from January 8.   Medication Adjustments/Labs and Tests Ordered: Current medicines are reviewed at length with the patient today.  Concerns regarding medicines are outlined above.  No orders of the defined types were placed in this encounter.  Medication changes: No orders of the defined types were placed in this encounter.   Signed, Park Liter, MD, [facility] 07/05/2019 11:45 AM    [place]

## 2019-07-19 DIAGNOSIS — E538 Deficiency of other specified B group vitamins: Secondary | ICD-10-CM | POA: Diagnosis not present

## 2019-09-06 DIAGNOSIS — D510 Vitamin B12 deficiency anemia due to intrinsic factor deficiency: Secondary | ICD-10-CM | POA: Diagnosis not present

## 2019-09-15 DIAGNOSIS — E538 Deficiency of other specified B group vitamins: Secondary | ICD-10-CM | POA: Diagnosis not present

## 2019-11-11 DIAGNOSIS — D510 Vitamin B12 deficiency anemia due to intrinsic factor deficiency: Secondary | ICD-10-CM | POA: Diagnosis not present

## 2019-11-30 DIAGNOSIS — H524 Presbyopia: Secondary | ICD-10-CM | POA: Diagnosis not present

## 2019-12-14 DIAGNOSIS — D510 Vitamin B12 deficiency anemia due to intrinsic factor deficiency: Secondary | ICD-10-CM | POA: Diagnosis not present

## 2020-01-09 ENCOUNTER — Ambulatory Visit (INDEPENDENT_AMBULATORY_CARE_PROVIDER_SITE_OTHER): Payer: PPO | Admitting: Cardiology

## 2020-01-09 ENCOUNTER — Encounter: Payer: Self-pay | Admitting: Cardiology

## 2020-01-09 ENCOUNTER — Other Ambulatory Visit: Payer: Self-pay

## 2020-01-09 VITALS — BP 130/64 | HR 76 | Ht 65.0 in | Wt 140.8 lb

## 2020-01-09 DIAGNOSIS — I739 Peripheral vascular disease, unspecified: Secondary | ICD-10-CM

## 2020-01-09 DIAGNOSIS — I471 Supraventricular tachycardia: Secondary | ICD-10-CM | POA: Diagnosis not present

## 2020-01-09 DIAGNOSIS — N812 Incomplete uterovaginal prolapse: Secondary | ICD-10-CM | POA: Insufficient documentation

## 2020-01-09 DIAGNOSIS — Z87448 Personal history of other diseases of urinary system: Secondary | ICD-10-CM

## 2020-01-09 DIAGNOSIS — E782 Mixed hyperlipidemia: Secondary | ICD-10-CM

## 2020-01-09 DIAGNOSIS — R102 Pelvic and perineal pain: Secondary | ICD-10-CM

## 2020-01-09 DIAGNOSIS — E119 Type 2 diabetes mellitus without complications: Secondary | ICD-10-CM

## 2020-01-09 HISTORY — DX: Pelvic and perineal pain: R10.2

## 2020-01-09 HISTORY — DX: Incomplete uterovaginal prolapse: N81.2

## 2020-01-09 HISTORY — DX: Personal history of other diseases of urinary system: Z87.448

## 2020-01-09 MED ORDER — DILTIAZEM HCL ER COATED BEADS 180 MG PO CP24: 180.0000 mg | ORAL_CAPSULE | Freq: Every day | ORAL | 1 refills | Status: DC

## 2020-01-09 NOTE — Progress Notes (Signed)
Cardiology Office Note:    Date:  01/09/2020   ID:  Donnella, Morford 1941-01-16, MRN [id number]  PCP:  Angelina Sheriff, MD  Cardiologist:  Jenne Campus, MD    Referring MD: Angelina Sheriff, MD   No chief complaint on file. Had 1 episode of palpitations  History of Present Illness:    Robin Castro is a 79 y.o. female with supraventricular tachycardia initially managed with digoxin however digoxin level was high she was switched from digoxin to Cardizem.  Also was discovered to have 60% right internal carotid artery stenosis that way doubt that his symptoms.  Comes today 2 months of follow-up.  Overall doing well but she described 1 episode of palpitation lasting up to 2 hours.  After that she said she felt miserable for day or maybe even longer.  There is no chest pain no sweating or shortness of breath associated with this sensation.  Past Medical History:  Diagnosis Date  . Chest discomfort    nl myoview 2010  . Diabetes mellitus    type 2- x 12 years  . Dysrhythmia    h/o rapid heart beat  . GERD (gastroesophageal reflux disease)   . Hypercholesteremia   . Palpitations   . PONV (postoperative nausea and vomiting)   . Pulsatile abdominal mass    neg U/S 2010  . SVD (spontaneous vaginal delivery)    x 2    Past Surgical History:  Procedure Laterality Date  . APPENDECTOMY    . BILATERAL SALPINGECTOMY  04/19/2014   Procedure: BILATERAL SALPINGECTOMY;  Surgeon: Daria Pastures, MD;  Location: [facility];  Service: Gynecology;;  . BLADDER SUSPENSION N/A 04/19/2014   Procedure: TRANSVAGINAL TAPE (TVT) PROCEDURE;  Surgeon: Daria Pastures, MD;  Location: [facility];  Service: Gynecology;  Laterality: N/A;  . COLONOSCOPY    . CYSTOCELE REPAIR N/A 04/19/2014   Procedure: ANTERIOR REPAIR (CYSTOCELE);  Surgeon: Daria Pastures, MD;  Location: [facility];  Service: Gynecology;  Laterality: N/A;  . CYSTOSCOPY N/A 04/19/2014   Procedure: CYSTOSCOPY;  Surgeon: Daria Pastures, MD;  Location: [facility];  Service: Gynecology;  Laterality: N/A;  . TOTAL KNEE ARTHROPLASTY    . VAGINAL HYSTERECTOMY N/A 04/19/2014   Procedure: HYSTERECTOMY VAGINAL;  Surgeon: Daria Pastures, MD;  Location: [facility];  Service: Gynecology;  Laterality: N/A;    Current Medications: Current Meds  Medication Sig  . aspirin 81 MG tablet Take 81 mg by mouth daily.    . Calcium Carbonate-Vitamin D (CALCIUM 600 + D PO) Take 1 tablet by mouth 2 (two) times daily.   . ergocalciferol (VITAMIN D2) 50000 UNITS capsule Take 50,000 Units by mouth every 14 (fourteen) days.   . fenofibrate (TRICOR) 145 MG tablet Take 145 mg by mouth daily.  Marland Kitchen losartan (COZAAR) 25 MG tablet Take 25 mg by mouth daily.    . metFORMIN (GLUCOPHAGE) 1000 MG tablet Take 1,000 mg by mouth 2 (two) times daily with a meal.  . omeprazole (PRILOSEC) 20 MG capsule Take 20 mg by mouth as needed.   . rosuvastatin (CRESTOR) 5 MG tablet Take 1 tablet (5 mg total) by mouth daily.     Allergies:   Lansoprazole, Statins, Enalapril, and Niacin and related   Social History   Socioeconomic History  . Marital status: Widowed    Spouse name: Not on file  . Number of children: 2  . Years of education: Not on file  . Highest education  level: Not on file  Occupational History  . Occupation: retired IT trainer  Tobacco Use  . Smoking status: Never Smoker  . Smokeless tobacco: Never Used  Vaping Use  . Vaping Use: Never used  Substance and Sexual Activity  . Alcohol use: No  . Drug use: No  . Sexual activity: Yes    Birth control/protection: Post-menopausal  Other Topics Concern  . Not on file  Social History Narrative  . Not on file   Social Determinants of Health   Financial Resource Strain:   . Difficulty of Paying Living Expenses: Not on file  Food Insecurity:   . Worried About Charity fundraiser in the Last Year: Not on file  . Ran Out of Food in the Last Year: Not on file  Transportation Needs:   . Lack of  Transportation (Medical): Not on file  . Lack of Transportation (Non-Medical): Not on file  Physical Activity:   . Days of Exercise per Week: Not on file  . Minutes of Exercise per Session: Not on file  Stress:   . Feeling of Stress : Not on file  Social Connections:   . Frequency of Communication with Friends and Family: Not on file  . Frequency of Social Gatherings with Friends and Family: Not on file  . Attends Religious Services: Not on file  . Active Member of Clubs or Organizations: Not on file  . Attends Archivist Meetings: Not on file  . Marital Status: Not on file     Family History: The patient's family history includes Diabetes in an other family member; Heart disease in an other family member; Hyperlipidemia in an other family member; Hypertension in an other family member. ROS:   Please see the history of present illness.    All 14 point review of systems negative except as described per history of present illness  EKGs/Labs/Other Studies Reviewed:      Recent Labs: 03/22/2019: TSH 4.590  Recent Lipid Panel    Component Value Date/Time   CHOL 150 03/22/2019 0811   TRIG 87 03/22/2019 0811   HDL 49 03/22/2019 0811   CHOLHDL 3.1 03/22/2019 0811   LDLCALC 85 03/22/2019 0811    Physical Exam:    VS:  BP 130/64   Pulse 76   Ht 5\' 5"  (1.651 m)   Wt 140 lb 12.8 oz (63.9 kg)   SpO2 96%   BMI 23.43 kg/m     Wt Readings from Last 3 Encounters:  01/09/20 140 lb 12.8 oz (63.9 kg)  07/05/19 141 lb (64 kg)  03/21/19 140 lb 6.4 oz (63.7 kg)     GEN:  Well nourished, well developed in no acute distress HEENT: Normal NECK: No JVD; No carotid bruits LYMPHATICS: No lymphadenopathy CARDIAC: RRR, no murmurs, no rubs, no gallops RESPIRATORY:  Clear to auscultation without rales, wheezing or rhonchi  ABDOMEN: Soft, non-tender, non-distended MUSCULOSKELETAL:  No edema; No deformity  SKIN: Warm and dry LOWER EXTREMITIES: no swelling NEUROLOGIC:  Alert and  oriented x 3 PSYCHIATRIC:  Normal affect   ASSESSMENT:    1. Paroxysmal SVT (supraventricular tachycardia) ([place])   2. Peripheral vascular disease, unspecified ([place]) up to 60% right internal carotid artery   3. Type 2 diabetes mellitus without complication, without long-term current use of insulin ([place])   4. Mixed hyperlipidemia    PLAN:    In order of problems listed above:  1. Paroxysmal supraventricular tachycardia, uncontrolled had another episode of breakthrough arrhythmia.  We  will do EKG today if EKG is fine will increase the dose of Cardizem from 1 20-1 80 daily. 2. Peripheral vascular disease in form of 60% right intracardiac artery stenosis.  We will continue monitoring and risk factors modifications, no TIA/CVA-like symptoms. 3. Type 2 diabetes followed by internal medicine team. 4. Mixed dyslipidemia she is on statin which I will continue.   Medication Adjustments/Labs and Tests Ordered: Current medicines are reviewed at length with the patient today.  Concerns regarding medicines are outlined above.  No orders of the defined types were placed in this encounter.  Medication changes: No orders of the defined types were placed in this encounter.   Signed, Park Liter, MD, [facility] 01/09/2020 3:37 PM    [facility]

## 2020-01-09 NOTE — Addendum Note (Signed)
Addended by: Senaida Ores on: 01/09/2020 04:01 PM   Modules accepted: Orders

## 2020-01-09 NOTE — Patient Instructions (Signed)
Medication Instructions:  Your physician has recommended you make the following change in your medication:  INCREASE: cardizem to 180 mg daily   *If you need a refill on your cardiac medications before your next appointment, please call your pharmacy*   Lab Work: None.  If you have labs (blood work) drawn today and your tests are completely normal, you will receive your results only by: Marland Kitchen MyChart Message (if you have MyChart) OR . A paper copy in the mail If you have any lab test that is abnormal or we need to change your treatment, we will call you to review the results.   Testing/Procedures: None.    Follow-Up: At [facility], you and your health needs are our priority.  As part of our continuing mission to provide you with exceptional heart care, we have created designated Provider Care Teams.  These Care Teams include your primary Cardiologist (physician) and Advanced Practice Providers (APPs -  Physician Assistants and Nurse Practitioners) who all work together to provide you with the care you need, when you need it.  We recommend signing up for the patient portal called "MyChart".  Sign up information is provided on this After Visit Summary.  MyChart is used to connect with patients for Virtual Visits (Telemedicine).  Patients are able to view lab/test results, encounter notes, upcoming appointments, etc.  Non-urgent messages can be sent to your provider as well.   To learn more about what you can do with MyChart, go to [email or web address].    Your next appointment:   4 month(s)  The format for your next appointment:   In Person  Provider:   Jenne Campus, MD   Other Instructions

## 2020-01-12 DIAGNOSIS — N390 Urinary tract infection, site not specified: Secondary | ICD-10-CM | POA: Diagnosis not present

## 2020-01-12 DIAGNOSIS — E7800 Pure hypercholesterolemia, unspecified: Secondary | ICD-10-CM | POA: Diagnosis not present

## 2020-01-12 DIAGNOSIS — Z6822 Body mass index (BMI) 22.0-22.9, adult: Secondary | ICD-10-CM | POA: Diagnosis not present

## 2020-01-12 DIAGNOSIS — F419 Anxiety disorder, unspecified: Secondary | ICD-10-CM | POA: Diagnosis not present

## 2020-01-12 DIAGNOSIS — Z0000 Encounter for general adult medical examination without abnormal findings: Secondary | ICD-10-CM | POA: Diagnosis not present

## 2020-01-12 DIAGNOSIS — Z79899 Other long term (current) drug therapy: Secondary | ICD-10-CM | POA: Diagnosis not present

## 2020-01-12 DIAGNOSIS — E1129 Type 2 diabetes mellitus with other diabetic kidney complication: Secondary | ICD-10-CM | POA: Diagnosis not present

## 2020-01-12 DIAGNOSIS — Z23 Encounter for immunization: Secondary | ICD-10-CM | POA: Diagnosis not present

## 2020-01-19 ENCOUNTER — Other Ambulatory Visit: Payer: Self-pay | Admitting: Cardiology

## 2020-02-02 DIAGNOSIS — R309 Painful micturition, unspecified: Secondary | ICD-10-CM | POA: Diagnosis not present

## 2020-02-02 DIAGNOSIS — Z01419 Encounter for gynecological examination (general) (routine) without abnormal findings: Secondary | ICD-10-CM | POA: Diagnosis not present

## 2020-02-02 DIAGNOSIS — Z1272 Encounter for screening for malignant neoplasm of vagina: Secondary | ICD-10-CM | POA: Diagnosis not present

## 2020-02-07 ENCOUNTER — Other Ambulatory Visit: Payer: Self-pay | Admitting: Obstetrics and Gynecology

## 2020-02-09 ENCOUNTER — Other Ambulatory Visit: Payer: Self-pay | Admitting: Obstetrics and Gynecology

## 2020-02-09 DIAGNOSIS — N644 Mastodynia: Secondary | ICD-10-CM

## 2020-02-21 DIAGNOSIS — E559 Vitamin D deficiency, unspecified: Secondary | ICD-10-CM | POA: Diagnosis not present

## 2020-03-08 ENCOUNTER — Ambulatory Visit
Admission: RE | Admit: 2020-03-08 | Discharge: 2020-03-08 | Disposition: A | Discharge status: Home / Self Care | Payer: PPO | Source: Ambulatory Visit | Attending: Obstetrics and Gynecology | Admitting: Obstetrics and Gynecology

## 2020-03-08 ENCOUNTER — Other Ambulatory Visit: Payer: Self-pay

## 2020-03-08 ENCOUNTER — Other Ambulatory Visit: Payer: Self-pay | Admitting: Obstetrics and Gynecology

## 2020-03-08 DIAGNOSIS — N644 Mastodynia: Secondary | ICD-10-CM | POA: Diagnosis not present

## 2020-03-08 DIAGNOSIS — R922 Inconclusive mammogram: Secondary | ICD-10-CM | POA: Diagnosis not present

## 2020-03-16 DIAGNOSIS — D510 Vitamin B12 deficiency anemia due to intrinsic factor deficiency: Secondary | ICD-10-CM | POA: Diagnosis not present

## 2020-03-23 ENCOUNTER — Ambulatory Visit
Admission: RE | Admit: 2020-03-23 | Discharge: 2020-03-23 | Disposition: A | Discharge status: Home / Self Care | Payer: PPO | Source: Ambulatory Visit | Attending: Obstetrics and Gynecology | Admitting: Obstetrics and Gynecology

## 2020-03-23 ENCOUNTER — Other Ambulatory Visit: Payer: Self-pay

## 2020-03-23 DIAGNOSIS — N644 Mastodynia: Secondary | ICD-10-CM

## 2020-03-23 DIAGNOSIS — N6321 Unspecified lump in the left breast, upper outer quadrant: Secondary | ICD-10-CM | POA: Diagnosis not present

## 2020-03-23 DIAGNOSIS — R928 Other abnormal and inconclusive findings on diagnostic imaging of breast: Secondary | ICD-10-CM | POA: Diagnosis not present

## 2020-03-23 DIAGNOSIS — N6032 Fibrosclerosis of left breast: Secondary | ICD-10-CM | POA: Diagnosis not present

## 2020-04-10 ENCOUNTER — Other Ambulatory Visit: Payer: Self-pay | Admitting: Cardiology

## 2020-04-11 ENCOUNTER — Other Ambulatory Visit: Payer: Self-pay

## 2020-04-11 MED ORDER — ROSUVASTATIN CALCIUM 5 MG PO TABS
5.0000 mg | ORAL_TABLET | Freq: Every day | ORAL | 2 refills | Status: DC
Start: 2020-04-11 — End: 2020-12-31

## 2020-04-11 NOTE — Telephone Encounter (Signed)
Refill sent for Rosuvastatin 5 mg to [facility].

## 2020-04-17 DIAGNOSIS — D510 Vitamin B12 deficiency anemia due to intrinsic factor deficiency: Secondary | ICD-10-CM | POA: Diagnosis not present

## 2020-05-09 DIAGNOSIS — E7800 Pure hypercholesterolemia, unspecified: Secondary | ICD-10-CM | POA: Insufficient documentation

## 2020-05-09 DIAGNOSIS — K219 Gastro-esophageal reflux disease without esophagitis: Secondary | ICD-10-CM | POA: Insufficient documentation

## 2020-05-09 DIAGNOSIS — R112 Nausea with vomiting, unspecified: Secondary | ICD-10-CM | POA: Insufficient documentation

## 2020-05-09 DIAGNOSIS — R1900 Intra-abdominal and pelvic swelling, mass and lump, unspecified site: Secondary | ICD-10-CM | POA: Insufficient documentation

## 2020-05-09 DIAGNOSIS — R002 Palpitations: Secondary | ICD-10-CM | POA: Insufficient documentation

## 2020-05-09 DIAGNOSIS — Z9889 Other specified postprocedural states: Secondary | ICD-10-CM | POA: Insufficient documentation

## 2020-05-09 DIAGNOSIS — I499 Cardiac arrhythmia, unspecified: Secondary | ICD-10-CM | POA: Insufficient documentation

## 2020-05-09 DIAGNOSIS — R0789 Other chest pain: Secondary | ICD-10-CM | POA: Insufficient documentation

## 2020-05-11 ENCOUNTER — Ambulatory Visit: Payer: PPO | Admitting: Cardiology

## 2020-05-11 ENCOUNTER — Other Ambulatory Visit: Payer: Self-pay

## 2020-05-11 ENCOUNTER — Encounter: Payer: Self-pay | Admitting: Cardiology

## 2020-05-11 VITALS — BP 110/70 | HR 60 | Ht 65.0 in | Wt 140.0 lb

## 2020-05-11 DIAGNOSIS — R002 Palpitations: Secondary | ICD-10-CM | POA: Diagnosis not present

## 2020-05-11 DIAGNOSIS — E119 Type 2 diabetes mellitus without complications: Secondary | ICD-10-CM

## 2020-05-11 DIAGNOSIS — I739 Peripheral vascular disease, unspecified: Secondary | ICD-10-CM | POA: Diagnosis not present

## 2020-05-11 DIAGNOSIS — I471 Supraventricular tachycardia: Secondary | ICD-10-CM

## 2020-05-11 NOTE — Progress Notes (Signed)
Cardiology Office Note:    Date:  05/11/2020   ID:  Tryna, Crow 1940/11/23, MRN [id number]  PCP:  Angelina Sheriff, MD  Cardiologist:  Jenne Campus, MD    Referring MD: Angelina Sheriff, MD   Chief Complaint  Patient presents with  . Follow-up  M doing fine  History of Present Illness:    Robin Castro is a 81 y.o. female with past medical history significant for supraventricular tachycardia initially managed with digoxin however her digoxin level was high and we decided to stop digoxin and switch her to calcium channel blocker.  Last time I seen her she still has some breakthrough arrhythmia dose was increased 280 mg of diltiazem that seems to be controlling arrhythmia perfectly.  Since have seen her last time there was no more palpitations.  She also does have up to 60% stenosis of the right internal carotic artery.  She is diabetic does have dyslipidemia.  Overall doing great she is trying to be active she walks on the regular basis there is no chest pain tightness squeezing pressure burning chest.  No palpitations no dizziness  Past Medical History:  Diagnosis Date  . Chest discomfort    nl myoview 2010  . Diabetes mellitus    type 2- x 12 years  . Diabetes mellitus ([place]) 05/31/2018  . Dysrhythmia    h/o rapid heart beat  . GERD (gastroesophageal reflux disease)   . History of cystocele 01/09/2020  . Hypercholesteremia   . Hyperlipidemia 10/21/2010  . Left sided sciatica 05/08/2016  . Other symptoms involving cardiovascular system 02/28/2009   Qualifier: Diagnosis of  By: Al-Rammal, RVT, RDCS, Missy    . Pain in pelvis 01/09/2020  . Palpitations   . Paroxysmal SVT (supraventricular tachycardia) ([place]) 05/11/2018  . Peripheral vascular disease, unspecified ([place]) up to 60% right internal carotid artery 07/05/2019  . PONV (postoperative nausea and vomiting)   . Postoperative state 04/19/2014  . Pulsatile abdominal mass    neg U/S 2010  . S/P TKR (total knee  replacement) 09/30/2012  . Second degree uterine prolapse 01/09/2020  . SVD (spontaneous vaginal delivery)    x 2    Past Surgical History:  Procedure Laterality Date  . APPENDECTOMY    . BILATERAL SALPINGECTOMY  04/19/2014   Procedure: BILATERAL SALPINGECTOMY;  Surgeon: Daria Pastures, MD;  Location: [facility];  Service: Gynecology;;  . BLADDER SUSPENSION N/A 04/19/2014   Procedure: TRANSVAGINAL TAPE (TVT) PROCEDURE;  Surgeon: Daria Pastures, MD;  Location: [facility];  Service: Gynecology;  Laterality: N/A;  . BREAST BIOPSY Left   . COLONOSCOPY    . CYSTOCELE REPAIR N/A 04/19/2014   Procedure: ANTERIOR REPAIR (CYSTOCELE);  Surgeon: Daria Pastures, MD;  Location: [facility];  Service: Gynecology;  Laterality: N/A;  . CYSTOSCOPY N/A 04/19/2014   Procedure: CYSTOSCOPY;  Surgeon: Daria Pastures, MD;  Location: [facility];  Service: Gynecology;  Laterality: N/A;  . TOTAL KNEE ARTHROPLASTY    . VAGINAL HYSTERECTOMY N/A 04/19/2014   Procedure: HYSTERECTOMY VAGINAL;  Surgeon: Daria Pastures, MD;  Location: [facility];  Service: Gynecology;  Laterality: N/A;    Current Medications: Current Meds  Medication Sig  . aspirin 81 MG tablet Take 81 mg by mouth daily.  . Calcium Carbonate-Vitamin D (CALCIUM 600 + D PO) Take 1 tablet by mouth 2 (two) times daily.  Marland Kitchen diltiazem (CARDIZEM CD) 180 MG 24 hr capsule Take 1 capsule (180 mg total) by mouth daily.  Marland Kitchen  ergocalciferol (VITAMIN D2) 50000 UNITS capsule Take 50,000 Units by mouth every 14 (fourteen) days.  . fenofibrate (TRICOR) 145 MG tablet Take 145 mg by mouth daily.  Marland Kitchen losartan (COZAAR) 25 MG tablet Take 25 mg by mouth daily.  . metFORMIN (GLUCOPHAGE) 1000 MG tablet Take 1,000 mg by mouth 2 (two) times daily with a meal.  . omeprazole (PRILOSEC) 20 MG capsule Take 20 mg by mouth as needed (Reflux).  . rosuvastatin (CRESTOR) 5 MG tablet Take 1 tablet (5 mg total) by mouth daily.     Allergies:   Lansoprazole, Statins, Enalapril, and Niacin and  related   Social History   Socioeconomic History  . Marital status: Widowed    Spouse name: Not on file  . Number of children: 2  . Years of education: Not on file  . Highest education level: Not on file  Occupational History  . Occupation: retired IT trainer  Tobacco Use  . Smoking status: Never Smoker  . Smokeless tobacco: Never Used  Vaping Use  . Vaping Use: Never used  Substance and Sexual Activity  . Alcohol use: No  . Drug use: No  . Sexual activity: Yes    Birth control/protection: Post-menopausal  Other Topics Concern  . Not on file  Social History Narrative  . Not on file   Social Determinants of Health   Financial Resource Strain: Not on file  Food Insecurity: Not on file  Transportation Needs: Not on file  Physical Activity: Not on file  Stress: Not on file  Social Connections: Not on file     Family History: The patient's family history includes Diabetes in an other family member; Heart disease in an other family member; Hyperlipidemia in an other family member; Hypertension in an other family member. ROS:   Please see the history of present illness.    All 14 point review of systems negative except as described per history of present illness  EKGs/Labs/Other Studies Reviewed:      Recent Labs: No results found for requested labs within last 8760 hours.  Recent Lipid Panel    Component Value Date/Time   CHOL 150 03/22/2019 0811   TRIG 87 03/22/2019 0811   HDL 49 03/22/2019 0811   CHOLHDL 3.1 03/22/2019 0811   LDLCALC 85 03/22/2019 0811    Physical Exam:    VS:  BP 110/70 (BP Location: Right Arm, Patient Position: Sitting, Cuff Size: Normal)   Pulse 60   Ht 5\' 5"  (1.651 m)   Wt 140 lb (63.5 kg)   SpO2 97%   BMI 23.30 kg/m     Wt Readings from Last 3 Encounters:  05/11/20 140 lb (63.5 kg)  01/09/20 140 lb 12.8 oz (63.9 kg)  07/05/19 141 lb (64 kg)     GEN:  Well nourished, well developed in no acute distress HEENT: Normal NECK:  No JVD; No carotid bruits LYMPHATICS: No lymphadenopathy CARDIAC: RRR, no murmurs, no rubs, no gallops RESPIRATORY:  Clear to auscultation without rales, wheezing or rhonchi  ABDOMEN: Soft, non-tender, non-distended MUSCULOSKELETAL:  No edema; No deformity  SKIN: Warm and dry LOWER EXTREMITIES: no swelling NEUROLOGIC:  Alert and oriented x 3 PSYCHIATRIC:  Normal affect   ASSESSMENT:    1. Paroxysmal SVT (supraventricular tachycardia) ([place])   2. Peripheral vascular disease, unspecified ([place]) up to 60% right internal carotid artery   3. Type 2 diabetes mellitus without complication, without long-term current use of insulin ([facility])   4. Palpitations    PLAN:  In order of problems listed above:  1. Paroxysmal supraventricular tachycardia none since increasing dose of Cardizem.  Will continue. 2. Peripheral vascular disease, up to 60% in the right internal carotic artery.  Asymptomatic no TIA/CVA-like symptoms.  Plan will be to continue monitoring she will require 1000 ultrasounds probably next time when I see her within 6 months.  Case was factors modifications. 3. Dyslipidemia: I did review her K PN which show me her LDL of 88 5 and HDL 53 this is from January 12, 2020.  She is on statin which I will continue.  She is only taking 5 mg of Crestor.  There is a moderate intensity statin, she is scheduled to see her primary care physician on Monday who will do her fasting lipid profile will adjust medication if needed and I suspect she will require delivery augmentation of the therapy. 4. Type 2 diabetes I did review her K PN which show me her hemoglobin A1c is 6.5% that is acceptable hemoglobin A1c.  I encouraged her to be active. 5. Palpitations: Denies having any.   Medication Adjustments/Labs and Tests Ordered: Current medicines are reviewed at length with the patient today.  Concerns regarding medicines are outlined above.  No orders of the defined types were placed in this  encounter.  Medication changes: No orders of the defined types were placed in this encounter.   Signed, Park Liter, MD, [facility] 05/11/2020 11:03 AM    [place]

## 2020-05-11 NOTE — Patient Instructions (Signed)

## 2020-05-14 DIAGNOSIS — G4700 Insomnia, unspecified: Secondary | ICD-10-CM | POA: Diagnosis not present

## 2020-05-14 DIAGNOSIS — M810 Age-related osteoporosis without current pathological fracture: Secondary | ICD-10-CM | POA: Diagnosis not present

## 2020-05-14 DIAGNOSIS — E7800 Pure hypercholesterolemia, unspecified: Secondary | ICD-10-CM | POA: Diagnosis not present

## 2020-05-14 DIAGNOSIS — I471 Supraventricular tachycardia: Secondary | ICD-10-CM | POA: Diagnosis not present

## 2020-05-14 DIAGNOSIS — D510 Vitamin B12 deficiency anemia due to intrinsic factor deficiency: Secondary | ICD-10-CM | POA: Diagnosis not present

## 2020-05-14 DIAGNOSIS — Z9181 History of falling: Secondary | ICD-10-CM | POA: Diagnosis not present

## 2020-05-14 DIAGNOSIS — F419 Anxiety disorder, unspecified: Secondary | ICD-10-CM | POA: Diagnosis not present

## 2020-05-14 DIAGNOSIS — Z6823 Body mass index (BMI) 23.0-23.9, adult: Secondary | ICD-10-CM | POA: Diagnosis not present

## 2020-05-14 DIAGNOSIS — E559 Vitamin D deficiency, unspecified: Secondary | ICD-10-CM | POA: Diagnosis not present

## 2020-05-14 DIAGNOSIS — E1129 Type 2 diabetes mellitus with other diabetic kidney complication: Secondary | ICD-10-CM | POA: Diagnosis not present

## 2020-05-14 DIAGNOSIS — Z1331 Encounter for screening for depression: Secondary | ICD-10-CM | POA: Diagnosis not present

## 2020-05-29 ENCOUNTER — Ambulatory Visit: Payer: Self-pay | Admitting: Surgery

## 2020-05-29 DIAGNOSIS — N6320 Unspecified lump in the left breast, unspecified quadrant: Secondary | ICD-10-CM

## 2020-05-29 DIAGNOSIS — N644 Mastodynia: Secondary | ICD-10-CM | POA: Diagnosis not present

## 2020-05-29 NOTE — H&P (View-Only) (Signed)
History of Present Illness Robin Castro. Joshwa Hemric MD; 05/29/2020 5:39 PM) The patient is a 80 year old female who presents with a breast mass. PCP - Sharyn Lull Horvath/ Lovette Cliche  This is a 80 year old female who presents with several months of focal areas of tenderness within the left breast. She is most tender in the left upper outer quadrant. She underwent mammogram and ultrasound in December. This showed an area of suspicion in the left breast at 2:00 7 cm from the nipple corresponding to an area of focal tenderness. Subsequently she underwent ultrasound-guided biopsy. Biopsy showed benign breast tissue with stromal fibrosis with differential including sclerotic fibroadenoma and sclerotic papilloma. No atypia identified. She was recommended to have this area excised due to the associated tenderness. No previous breast problems. No family history of breast cancer.  CLINICAL DATA: Patient presents for focal areas of tenderness within the left breast.  EXAM: DIGITAL DIAGNOSTIC BILATERAL MAMMOGRAM WITH CAD AND TOMO  ULTRASOUND LEFT BREAST  COMPARISON: Previous exam(s).  ACR Breast Density Category d: The breast tissue is extremely dense, which lowers the sensitivity of mammography.  FINDINGS: No concerning masses, calcifications or distortion identified within the left or right breast. No suspicious abnormality underlying the palpable marker within the inferior left breast or superior left breast.  Mammographic images were processed with CAD.  Targeted ultrasound is performed, showing a focal masslike area of posterior acoustic shadowing left breast 2 o'clock position 7 cm from nipple measuring 4 x 5 x 3 mm demonstrated best with harmonics. This is at 1 of the patient's reported sites of focal tenderness.  Additional areas of tenderness demonstrate no abnormality at the 2 o'clock position 9 cm from nipple, 3 o'clock position 8 cm from the nipple and 5 o'clock position 6 cm from  the nipple.  No left axillary adenopathy.  IMPRESSION: At the site of focal tenderness left breast 2 o'clock position 7 cm from nipple there is a focal masslike area of shadowing, indeterminate in etiology.  RECOMMENDATION: Ultrasound-guided core needle biopsy left breast 2 o'clock position 7 cm from nipple at the site of focal tenderness.  I have discussed the findings and recommendations with the patient. If applicable, a reminder letter will be sent to the patient regarding the next appointment.  BI-RADS CATEGORY 4: Suspicious.   Electronically Signed By: Lovey Newcomer M.D. On: 03/08/2020 15:03  CLINICAL DATA: 80 year old female with an indeterminate left breast mass/shadowing.  EXAM: ULTRASOUND GUIDED LEFT BREAST CORE NEEDLE BIOPSY  COMPARISON: Previous exam(s).  PROCEDURE: I met with the patient and we discussed the procedure of ultrasound-guided biopsy, including benefits and alternatives. We discussed the high likelihood of a successful procedure. We discussed the risks of the procedure, including infection, bleeding, tissue injury, clip migration, and inadequate sampling. Informed written consent was given. The usual time-out protocol was performed immediately prior to the procedure.  Lesion quadrant: Upper outer quadrant  Using sterile technique and 1% Lidocaine as local anesthetic, under direct ultrasound visualization, a 12 gauge spring-loaded device was used to perform biopsy of a shadowing mass at the 2 o'clock position 7 cm from the nipple using a inferior approach. At the conclusion of the procedure a coil shaped tissue marker clip was deployed into the biopsy cavity. Follow up 2 view mammogram was performed and dictated separately.  IMPRESSION: Ultrasound guided biopsy of the left breast. No apparent complications.  Electronically Signed: By: Kristopher Oppenheim M.D. On: 03/23/2020 13:27   Problem List/Past Medical Rodman Key K. Lavan Imes, MD;  05/29/2020 5:39 PM)  MASS OF LEFT BREAST ON MAMMOGRAM (N63.20) BREAST PAIN, LEFT (N64.4)  Past Surgical History Janeann Forehand, CNA; 05/29/2020 1:45 PM) Appendectomy Breast Biopsy Left. multiple Colon Polyp Removal - Colonoscopy Hysterectomy (not due to cancer) - Partial Knee Surgery Left.  Diagnostic Studies History Janeann Forehand, CNA; 05/29/2020 1:45 PM) Colonoscopy 1-5 years ago Mammogram 1-3 years ago  Allergies Janeann Forehand, CNA; 05/29/2020 1:52 PM) Niacin *VITAMINS* Niacin (Antihyperlipidemic) *ANTIHYPERLIPIDEMICS* CILUPREVIR Enalapril Mal-Diltiazem Malate *ANTIHYPERTENSIVES* Jardiance *ANTIDIABETICS* Glimepiride *ANTIDIABETICS* Allergies Reconciled  Medication History Janeann Forehand, CNA; 05/29/2020 1:52 PM) dilTIAZem HCl (60MG Tablet, Oral) Active. Losartan Potassium (25MG Tablet, Oral) Active. metFORMIN HCl (1000MG Tablet, Oral) Active. Rosuvastatin Calcium (5MG Tablet, Oral) Active. Fenofibrate (40MG Tablet, Oral) Active. Vitamin D (50 MCG(2000 UT) Capsule, Oral) Active. Aspirin (81MG Tablet Chewable, Oral) Active. Calcium (500MG Tablet, Oral) Active. PriLOSEC (2.5MG Packet, Oral) Active. diazePAM (5MG Tablet, Oral) Active. OneTouch Ultra (In Vitro) Active. Fenofibrate (145MG Tablet, Oral) Active. dilTIAZem HCl ER Coated Beads (180MG Capsule ER 24HR, Oral) Active. dilTIAZem HCl ER Coated Beads (120MG Capsule ER 24HR, Oral) Active. Ciprofloxacin HCl (250MG Tablet, Oral) Active. Cephalexin (500MG Capsule, Oral) Active. Amoxicillin (500MG Capsule, Oral) Active. Medications Reconciled  Social History Janeann Forehand, CNA; 05/29/2020 1:45 PM) No alcohol use No caffeine use No drug use Tobacco use Never smoker.  Family History Janeann Forehand, CNA; 05/29/2020 1:45 PM) Arthritis Father, Mother. Cancer Brother. Colon Cancer Brother. Colon Polyps Brother. Diabetes Mellitus Brother, Family Members In [place],  Mother. Heart Disease Mother. Hypertension Brother, Father, Mother. Kidney Disease Mother. Rectal Cancer Brother.  Pregnancy / Birth History Janeann Forehand, CNA; 05/29/2020 1:45 PM) Age at menarche 5 years. Age of menopause 45-50 Contraceptive History Oral contraceptives. Gravida 2 Maternal age 58-20 Para 2  Other Problems Robin Castro. Blas Riches, MD; 05/29/2020 5:39 PM) Diabetes Mellitus Gastroesophageal Reflux Disease Heart murmur Hypercholesterolemia Kidney Stone     Review of Systems [facility] CNA; 05/29/2020 1:45 PM) General Not Present- Appetite Loss, Chills, Fatigue, Fever, Night Sweats, Weight Gain and Weight Loss. Skin Not Present- Change in Wart/Mole, Dryness, Hives, Jaundice, New Lesions, Non-Healing Wounds, Rash and Ulcer. HEENT Present- Wears glasses/contact lenses. Not Present- Earache, Hearing Loss, Hoarseness, Nose Bleed, Oral Ulcers, Ringing in the Ears, Seasonal Allergies, Sinus Pain, Sore Throat, Visual Disturbances and Yellow Eyes. Respiratory Not Present- Bloody sputum, Chronic Cough, Difficulty Breathing, Snoring and Wheezing. Breast Present- Breast Pain. Not Present- Breast Mass, Nipple Discharge and Skin Changes. Cardiovascular Present- Leg Cramps, Palpitations and Rapid Heart Rate. Not Present- Chest Pain, Difficulty Breathing Lying Down, Shortness of Breath and Swelling of Extremities. Gastrointestinal Not Present- Abdominal Pain, Bloating, Bloody Stool, Change in Bowel Habits, Chronic diarrhea, Constipation, Difficulty Swallowing, Excessive gas, Gets full quickly at meals, Hemorrhoids, Indigestion, Nausea, Rectal Pain and Vomiting. Female Genitourinary Not Present- Frequency, Nocturia, Painful Urination, Pelvic Pain and Urgency. Musculoskeletal Not Present- Back Pain, Joint Pain, Joint Stiffness, Muscle Pain, Muscle Weakness and Swelling of Extremities. Neurological Not Present- Decreased Memory, Fainting, Headaches, Numbness, Seizures,  Tingling, Tremor, Trouble walking and Weakness. Psychiatric Not Present- Anxiety, Bipolar, Change in Sleep Pattern, Depression, Fearful and Frequent crying. Endocrine Not Present- Cold Intolerance, Excessive Hunger, Hair Changes, Heat Intolerance, Hot flashes and New Diabetes. Hematology Not Present- Blood Thinners, Easy Bruising, Excessive bleeding, Gland problems, HIV and Persistent Infections.  Vitals (Donyelle Alston CNA; 05/29/2020 1:53 PM) 05/29/2020 1:52 PM Weight: 142.13 lb Height: 65in Body Surface Area: 1.71 m Body Mass Index: 23.65 kg/m  Temp.: 97.103F  Pulse: 88 (Regular)  P.OX: 97% (Room air) BP: 120/70(Sitting, Left  Arm, Standard)        Physical Exam Rodman Key K. Reyansh Kushnir MD; 05/29/2020 5:39 PM)  The physical exam findings are as follows: Note:Constitutional: WDWN in NAD, conversant, no obvious deformities; resting comfortably Eyes: Pupils equal, round; sclera anicteric; moist conjunctiva; no lid lag HENT: Oral mucosa moist; good dentition Neck: No masses palpated, trachea midline; no thyromegaly Lungs: CTA bilaterally; normal respiratory effort Breasts: symmetric; no nipple changes; no palpable masses; tender in [place], LLIQ. No axillary lymphadenopathy CV: Regular rate and rhythm; no murmurs; extremities well-perfused with no edema Abd: +bowel sounds, soft, non-tender, no palpable organomegaly; no palpable hernias Musc: Normal gait; no apparent clubbing or cyanosis in extremities Lymphatic: No palpable cervical or axillary lymphadenopathy Skin: Warm, dry; no sign of jaundice Psychiatric - alert and oriented x 4; calm mood and affect    Assessment & Plan Rodman Key K. Zyshawn Bohnenkamp MD; 05/29/2020 2:03 PM)  MASS OF LEFT BREAST ON MAMMOGRAM (N63.20)   BREAST PAIN, LEFT (N64.4)  Current Plans Schedule for Surgery - Left radioactive seed localized lumpectomy. The surgical procedure has been discussed with the patient. Potential risks, benefits, alternative  treatments, and expected outcomes have been explained. All of the patient's questions at this time have been answered. The likelihood of reaching the patient's treatment goal is good. The patient understand the proposed surgical procedure and wishes to proceed.  Robin Castro. Georgette Dover, MD, [facility] Surgery  General/ Trauma Surgery   05/29/2020 5:40 PM

## 2020-05-29 NOTE — H&P (Signed)
History of Present Illness Robin Castro. Robin Iwai MD; 05/29/2020 5:39 PM) The patient is a 80 year old female who presents with a breast mass. PCP - Robin Castro/ Robin Castro  This is a 80 year old female who presents with several months of focal areas of tenderness within the left breast. She is most tender in the left upper outer quadrant. She underwent mammogram and ultrasound in December. This showed an area of suspicion in the left breast at 2:00 7 cm from the nipple corresponding to an area of focal tenderness. Subsequently she underwent ultrasound-guided biopsy. Biopsy showed benign breast tissue with stromal fibrosis with differential including sclerotic fibroadenoma and sclerotic papilloma. No atypia identified. She was recommended to have this area excised due to the associated tenderness. No previous breast problems. No family history of breast cancer.  CLINICAL DATA: Patient presents for focal areas of tenderness within the left breast.  EXAM: DIGITAL DIAGNOSTIC BILATERAL MAMMOGRAM WITH CAD AND TOMO  ULTRASOUND LEFT BREAST  COMPARISON: Previous exam(s).  ACR Breast Density Category d: The breast tissue is extremely dense, which lowers the sensitivity of mammography.  FINDINGS: No concerning masses, calcifications or distortion identified within the left or right breast. No suspicious abnormality underlying the palpable marker within the inferior left breast or superior left breast.  Mammographic images were processed with CAD.  Targeted ultrasound is performed, showing a focal masslike area of posterior acoustic shadowing left breast 2 o'clock position 7 cm from nipple measuring 4 x 5 x 3 mm demonstrated best with harmonics. This is at 1 of the patient's reported sites of focal tenderness.  Additional areas of tenderness demonstrate no abnormality at the 2 o'clock position 9 cm from nipple, 3 o'clock position 8 cm from the nipple and 5 o'clock position 6 cm from  the nipple.  No left axillary adenopathy.  IMPRESSION: At the site of focal tenderness left breast 2 o'clock position 7 cm from nipple there is a focal masslike area of shadowing, indeterminate in etiology.  RECOMMENDATION: Ultrasound-guided core needle biopsy left breast 2 o'clock position 7 cm from nipple at the site of focal tenderness.  I have discussed the findings and recommendations with the patient. If applicable, a reminder letter will be sent to the patient regarding the next appointment.  BI-RADS CATEGORY 4: Suspicious.   Electronically Signed By: Robin Castro M.D. On: 03/08/2020 15:03  CLINICAL DATA: 80 year old female with an indeterminate left breast mass/shadowing.  EXAM: ULTRASOUND GUIDED LEFT BREAST CORE NEEDLE BIOPSY  COMPARISON: Previous exam(s).  PROCEDURE: I met with the patient and we discussed the procedure of ultrasound-guided biopsy, including benefits and alternatives. We discussed the high likelihood of a successful procedure. We discussed the risks of the procedure, including infection, bleeding, tissue injury, clip migration, and inadequate sampling. Informed written consent was given. The usual time-out protocol was performed immediately prior to the procedure.  Lesion quadrant: Upper outer quadrant  Using sterile technique and 1% Lidocaine as local anesthetic, under direct ultrasound visualization, a 12 gauge spring-loaded device was used to perform biopsy of a shadowing mass at the 2 o'clock position 7 cm from the nipple using a inferior approach. At the conclusion of the procedure a coil shaped tissue marker clip was deployed into the biopsy cavity. Follow up 2 view mammogram was performed and dictated separately.  IMPRESSION: Ultrasound guided biopsy of the left breast. No apparent complications.  Electronically Signed: By: Robin Castro M.D. On: 03/23/2020 13:27   Problem List/Past Medical Robin Key K. Maninder Deboer, MD;  05/29/2020 5:39 PM)  MASS OF LEFT BREAST ON MAMMOGRAM (N63.20) BREAST PAIN, LEFT (N64.4)  Past Surgical History Robin Forehand, CNA; 05/29/2020 1:45 PM) Appendectomy Breast Biopsy Left. multiple Colon Polyp Removal - Colonoscopy Hysterectomy (not due to cancer) - Partial Knee Surgery Left.  Diagnostic Studies History Robin Forehand, CNA; 05/29/2020 1:45 PM) Colonoscopy 1-5 years ago Mammogram 1-3 years ago  Allergies Robin Forehand, CNA; 05/29/2020 1:52 PM) Niacin *VITAMINS* Niacin (Antihyperlipidemic) *ANTIHYPERLIPIDEMICS* CILUPREVIR Enalapril Mal-Diltiazem Malate *ANTIHYPERTENSIVES* Jardiance *ANTIDIABETICS* Glimepiride *ANTIDIABETICS* Allergies Reconciled  Medication History Robin Forehand, CNA; 05/29/2020 1:52 PM) dilTIAZem HCl (60MG Tablet, Oral) Active. Losartan Potassium (25MG Tablet, Oral) Active. metFORMIN HCl (1000MG Tablet, Oral) Active. Rosuvastatin Calcium (5MG Tablet, Oral) Active. Fenofibrate (40MG Tablet, Oral) Active. Vitamin D (50 MCG(2000 UT) Capsule, Oral) Active. Aspirin (81MG Tablet Chewable, Oral) Active. Calcium (500MG Tablet, Oral) Active. PriLOSEC (2.5MG Packet, Oral) Active. diazePAM (5MG Tablet, Oral) Active. OneTouch Ultra (In Vitro) Active. Fenofibrate (145MG Tablet, Oral) Active. dilTIAZem HCl ER Coated Beads (180MG Capsule ER 24HR, Oral) Active. dilTIAZem HCl ER Coated Beads (120MG Capsule ER 24HR, Oral) Active. Ciprofloxacin HCl (250MG Tablet, Oral) Active. Cephalexin (500MG Capsule, Oral) Active. Amoxicillin (500MG Capsule, Oral) Active. Medications Reconciled  Social History Robin Forehand, CNA; 05/29/2020 1:45 PM) No alcohol use No caffeine use No drug use Tobacco use Never smoker.  Family History Robin Forehand, CNA; 05/29/2020 1:45 PM) Arthritis Father, Mother. Cancer Brother. Colon Cancer Brother. Colon Polyps Brother. Diabetes Mellitus Brother, Family Members In [place],  Mother. Heart Disease Mother. Hypertension Brother, Father, Mother. Kidney Disease Mother. Rectal Cancer Brother.  Pregnancy / Birth History Robin Forehand, CNA; 05/29/2020 1:45 PM) Age at menarche 39 years. Age of menopause 57-50 Contraceptive History Oral contraceptives. Gravida 2 Maternal age 79-20 Para 2  Other Problems Robin Castro. Hosea Hanawalt, MD; 05/29/2020 5:39 PM) Diabetes Mellitus Gastroesophageal Reflux Disease Heart murmur Hypercholesterolemia Kidney Stone     Review of Systems [facility] CNA; 05/29/2020 1:45 PM) General Not Present- Appetite Loss, Chills, Fatigue, Fever, Night Sweats, Weight Gain and Weight Loss. Skin Not Present- Change in Wart/Mole, Dryness, Hives, Jaundice, New Lesions, Non-Healing Wounds, Rash and Ulcer. HEENT Present- Wears glasses/contact lenses. Not Present- Earache, Hearing Loss, Hoarseness, Nose Bleed, Oral Ulcers, Ringing in the Ears, Seasonal Allergies, Sinus Pain, Sore Throat, Visual Disturbances and Yellow Eyes. Respiratory Not Present- Bloody sputum, Chronic Cough, Difficulty Breathing, Snoring and Wheezing. Breast Present- Breast Pain. Not Present- Breast Mass, Nipple Discharge and Skin Changes. Cardiovascular Present- Leg Cramps, Palpitations and Rapid Heart Rate. Not Present- Chest Pain, Difficulty Breathing Lying Down, Shortness of Breath and Swelling of Extremities. Gastrointestinal Not Present- Abdominal Pain, Bloating, Bloody Stool, Change in Bowel Habits, Chronic diarrhea, Constipation, Difficulty Swallowing, Excessive gas, Gets full quickly at meals, Hemorrhoids, Indigestion, Nausea, Rectal Pain and Vomiting. Female Genitourinary Not Present- Frequency, Nocturia, Painful Urination, Pelvic Pain and Urgency. Musculoskeletal Not Present- Back Pain, Joint Pain, Joint Stiffness, Muscle Pain, Muscle Weakness and Swelling of Extremities. Neurological Not Present- Decreased Memory, Fainting, Headaches, Numbness, Seizures,  Tingling, Tremor, Trouble walking and Weakness. Psychiatric Not Present- Anxiety, Bipolar, Change in Sleep Pattern, Depression, Fearful and Frequent crying. Endocrine Not Present- Cold Intolerance, Excessive Hunger, Hair Changes, Heat Intolerance, Hot flashes and New Diabetes. Hematology Not Present- Blood Thinners, Easy Bruising, Excessive bleeding, Gland problems, HIV and Persistent Infections.  Vitals (Donyelle Alston CNA; 05/29/2020 1:53 PM) 05/29/2020 1:52 PM Weight: 142.13 lb Height: 65in Body Surface Area: 1.71 m Body Mass Index: 23.65 kg/m  Temp.: 97.41F  Pulse: 88 (Regular)  P.OX: 97% (Room air) BP: 120/70(Sitting, Left  Arm, Standard)        Physical Exam Robin Key K. Lillionna Nabi MD; 05/29/2020 5:39 PM)  The physical exam findings are as follows: Note:Constitutional: WDWN in NAD, conversant, no obvious deformities; resting comfortably Eyes: Pupils equal, round; sclera anicteric; moist conjunctiva; no lid lag HENT: Oral mucosa moist; good dentition Neck: No masses palpated, trachea midline; no thyromegaly Lungs: CTA bilaterally; normal respiratory effort Breasts: symmetric; no nipple changes; no palpable masses; tender in [place], LLIQ. No axillary lymphadenopathy CV: Regular rate and rhythm; no murmurs; extremities well-perfused with no edema Abd: +bowel sounds, soft, non-tender, no palpable organomegaly; no palpable hernias Musc: Normal gait; no apparent clubbing or cyanosis in extremities Lymphatic: No palpable cervical or axillary lymphadenopathy Skin: Warm, dry; no sign of jaundice Psychiatric - alert and oriented x 4; calm mood and affect    Assessment & Plan Robin Key K. Sostenes Kauffmann MD; 05/29/2020 2:03 PM)  MASS OF LEFT BREAST ON MAMMOGRAM (N63.20)   BREAST PAIN, LEFT (N64.4)  Current Plans Schedule for Surgery - Left radioactive seed localized lumpectomy. The surgical procedure has been discussed with the patient. Potential risks, benefits, alternative  treatments, and expected outcomes have been explained. All of the patient's questions at this time have been answered. The likelihood of reaching the patient's treatment goal is good. The patient understand the proposed surgical procedure and wishes to proceed.  Robin Castro. Georgette Dover, MD, [facility] Surgery  General/ Trauma Surgery   05/29/2020 5:40 PM

## 2020-05-30 ENCOUNTER — Other Ambulatory Visit: Payer: Self-pay | Admitting: Surgery

## 2020-05-30 DIAGNOSIS — N6320 Unspecified lump in the left breast, unspecified quadrant: Secondary | ICD-10-CM

## 2020-05-31 ENCOUNTER — Encounter (HOSPITAL_BASED_OUTPATIENT_CLINIC_OR_DEPARTMENT_OTHER): Payer: Self-pay | Admitting: Surgery

## 2020-05-31 ENCOUNTER — Other Ambulatory Visit: Payer: Self-pay

## 2020-05-31 NOTE — Progress Notes (Signed)
Called and spoke with Hassan Rowan at Dr. Vonna Kotyk office to cardiac clearance on chart including hold time for asa.

## 2020-06-01 ENCOUNTER — Telehealth: Payer: Self-pay

## 2020-06-01 NOTE — Telephone Encounter (Signed)
Dr. Agustin Cree to review.  Robin Castro is a very pleasant 80 year old female with past medical history of SVT, palpitation, PAD with up to 60% right internal carotid artery stenosis, and DM2.  She does not have any prior history of CAD.  Myoview obtained in 2010 was normal.  Most recent echocardiogram obtained on 06/10/2018 showed EF 60 to 65%, mild LVH, no significant valve issue.  She was recently diagnosed with left breast mass and underwent ultrasound-guided core needle biopsy on 03/08/2020.  Biopsy report shows benign breast tissue with stromal fibrosis, differential diagnosis include sclerotic fibroadenoma or sclerotic papilloma.  Patient was referred to Robin Castro of Robin Castro surgery who recommended breast lumpectomy.   Talking with the patient, she is very active and denies any recent exertional chest pain or shortness of breath.  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  She frequently walks her dog and walk up to 2 miles per day with out any exertional chest discomfort.  She appears to be able to accomplish at least 4 METS of activity.  Dr. Agustin Cree, given the above information, would the patient be cleared for the breast lumpectomy surgery or do recommend additional work-up.  Surgery is next Wednesday.  Patient has been instructed to start holding aspirin the Saturday by her surgeon.  Please forward your response to P CV DIV PREOP

## 2020-06-01 NOTE — Telephone Encounter (Signed)
   Primary Cardiologist: Jenne Campus, MD  Chart reviewed as part of pre-operative protocol coverage. Given past medical history and time since last visit, based on ACC/AHA guidelines, Robin Castro would be at acceptable risk for the planned procedure without further cardiovascular testing.  If needed, she can hold aspirin for 5 to 7 days prior to surgery and restart as soon as possible after the surgery.  The patient was advised that if she develops new symptoms prior to surgery to contact our office to arrange for a follow-up visit, and she verbalized understanding.  I will route this recommendation to the requesting party via Epic fax function and remove from pre-op pool.  Please call with questions.  [place], [place] 06/01/2020, 4:16 PM

## 2020-06-01 NOTE — Telephone Encounter (Signed)
   [facility] Pre-operative Risk Assessment    Request for surgical clearance:  1. What type of surgery is being performed? Left Breast Lumpectomy   2. When is this surgery scheduled? 06/06/2020   3. What type of clearance is required (medical clearance vs. Pharmacy clearance to hold med vs. Both)? Medical Clearance   4. Are there any medications that need to be held prior to surgery and how long?Not specified but he is on ASA 37m   5. Practice  name and name of physician performing surgery[facility]   6. What is your office phone number: [phone]   7.   What is your office fax number: [phone]: KMammie Lorenzo 8.   Anesthesia type (None, local, MAC, general) ? General Anesthesia   JBasil DessPrevatt 06/01/2020, 10:26 AM  _________________________________________________________________   (provider comments below)

## 2020-06-01 NOTE — Telephone Encounter (Signed)
She should be fine to proceeding with surgery.  Overall breast lumpectomy is considered a low risk surgery, look like she is doing quite well with her ability to exercise so should be no problem to proceed with surgery

## 2020-06-02 ENCOUNTER — Inpatient Hospital Stay (HOSPITAL_COMMUNITY): Admission: RE | Admit: 2020-06-02 | Payer: PPO | Source: Ambulatory Visit

## 2020-06-04 ENCOUNTER — Encounter (HOSPITAL_BASED_OUTPATIENT_CLINIC_OR_DEPARTMENT_OTHER)
Admission: RE | Admit: 2020-06-04 | Discharge: 2020-06-04 | Disposition: A | Discharge status: Home / Self Care | Payer: PPO | Source: Ambulatory Visit | Attending: Surgery | Admitting: Surgery

## 2020-06-04 ENCOUNTER — Other Ambulatory Visit (HOSPITAL_COMMUNITY)
Admission: RE | Admit: 2020-06-04 | Discharge: 2020-06-04 | Disposition: A | Discharge status: Home / Self Care | Payer: PPO | Source: Ambulatory Visit | Attending: Surgery | Admitting: Surgery

## 2020-06-04 DIAGNOSIS — Z20822 Contact with and (suspected) exposure to covid-19: Secondary | ICD-10-CM | POA: Diagnosis not present

## 2020-06-04 DIAGNOSIS — N6032 Fibrosclerosis of left breast: Secondary | ICD-10-CM | POA: Diagnosis not present

## 2020-06-04 DIAGNOSIS — Z01812 Encounter for preprocedural laboratory examination: Secondary | ICD-10-CM | POA: Insufficient documentation

## 2020-06-04 DIAGNOSIS — Z888 Allergy status to other drugs, medicaments and biological substances status: Secondary | ICD-10-CM | POA: Diagnosis not present

## 2020-06-04 DIAGNOSIS — N644 Mastodynia: Secondary | ICD-10-CM | POA: Diagnosis not present

## 2020-06-04 DIAGNOSIS — E119 Type 2 diabetes mellitus without complications: Secondary | ICD-10-CM | POA: Diagnosis not present

## 2020-06-04 LAB — BASIC METABOLIC PANEL
Anion gap: 10 (ref 5–15)
BUN: 10 mg/dL (ref 8–23)
CO2: 26 mmol/L (ref 22–32)
Calcium: 9.1 mg/dL (ref 8.9–10.3)
Chloride: 103 mmol/L (ref 98–111)
Creatinine, Ser: 0.65 mg/dL (ref 0.44–1.00)
GFR, Estimated: >60 mL/min (ref >60)
Glucose, Bld: 65 mg/dL — ABNORMAL LOW (ref 70–99)
Potassium: 4.6 mmol/L (ref 3.5–5.1)
Sodium: 139 mmol/L (ref 135–145)

## 2020-06-04 LAB — SARS CORONAVIRUS 2 (TAT 6-24 HRS): SARS Coronavirus 2: NEGATIVE

## 2020-06-04 NOTE — Progress Notes (Signed)

## 2020-06-05 ENCOUNTER — Other Ambulatory Visit: Payer: Self-pay

## 2020-06-05 ENCOUNTER — Ambulatory Visit
Admission: RE | Admit: 2020-06-05 | Discharge: 2020-06-05 | Disposition: A | Discharge status: Home / Self Care | Payer: PPO | Source: Ambulatory Visit | Attending: Surgery | Admitting: Surgery

## 2020-06-05 DIAGNOSIS — N6032 Fibrosclerosis of left breast: Secondary | ICD-10-CM | POA: Diagnosis not present

## 2020-06-05 DIAGNOSIS — N6320 Unspecified lump in the left breast, unspecified quadrant: Secondary | ICD-10-CM

## 2020-06-06 ENCOUNTER — Ambulatory Visit
Admission: RE | Admit: 2020-06-06 | Discharge: 2020-06-06 | Disposition: A | Discharge status: Home / Self Care | Payer: PPO | Source: Ambulatory Visit | Attending: Surgery | Admitting: Surgery

## 2020-06-06 ENCOUNTER — Ambulatory Visit (HOSPITAL_BASED_OUTPATIENT_CLINIC_OR_DEPARTMENT_OTHER)
Admission: RE | Admit: 2020-06-06 | Discharge: 2020-06-06 | Disposition: A | Discharge status: Home / Self Care | Payer: PPO | Attending: Surgery | Admitting: Surgery

## 2020-06-06 ENCOUNTER — Other Ambulatory Visit: Payer: Self-pay

## 2020-06-06 ENCOUNTER — Encounter (HOSPITAL_BASED_OUTPATIENT_CLINIC_OR_DEPARTMENT_OTHER): Payer: Self-pay | Admitting: Surgery

## 2020-06-06 ENCOUNTER — Ambulatory Visit (HOSPITAL_BASED_OUTPATIENT_CLINIC_OR_DEPARTMENT_OTHER): Payer: PPO | Admitting: Certified Registered"

## 2020-06-06 ENCOUNTER — Encounter (HOSPITAL_BASED_OUTPATIENT_CLINIC_OR_DEPARTMENT_OTHER)
Admission: RE | Disposition: A | Discharge status: Home / Self Care | Payer: Self-pay | Source: Home / Self Care | Attending: Surgery

## 2020-06-06 DIAGNOSIS — R920 Mammographic microcalcification found on diagnostic imaging of breast: Secondary | ICD-10-CM | POA: Diagnosis not present

## 2020-06-06 DIAGNOSIS — E119 Type 2 diabetes mellitus without complications: Secondary | ICD-10-CM | POA: Diagnosis not present

## 2020-06-06 DIAGNOSIS — N6320 Unspecified lump in the left breast, unspecified quadrant: Secondary | ICD-10-CM

## 2020-06-06 DIAGNOSIS — Z888 Allergy status to other drugs, medicaments and biological substances status: Secondary | ICD-10-CM | POA: Insufficient documentation

## 2020-06-06 DIAGNOSIS — N6032 Fibrosclerosis of left breast: Secondary | ICD-10-CM | POA: Insufficient documentation

## 2020-06-06 DIAGNOSIS — K219 Gastro-esophageal reflux disease without esophagitis: Secondary | ICD-10-CM | POA: Diagnosis not present

## 2020-06-06 DIAGNOSIS — E7800 Pure hypercholesterolemia, unspecified: Secondary | ICD-10-CM | POA: Diagnosis not present

## 2020-06-06 DIAGNOSIS — N644 Mastodynia: Secondary | ICD-10-CM | POA: Diagnosis not present

## 2020-06-06 DIAGNOSIS — D242 Benign neoplasm of left breast: Secondary | ICD-10-CM | POA: Diagnosis not present

## 2020-06-06 DIAGNOSIS — N6489 Other specified disorders of breast: Secondary | ICD-10-CM | POA: Diagnosis not present

## 2020-06-06 HISTORY — PX: BREAST LUMPECTOMY WITH RADIOACTIVE SEED LOCALIZATION: SHX6424

## 2020-06-06 LAB — GLUCOSE, CAPILLARY
Glucose-Capillary: 102 mg/dL — ABNORMAL HIGH (ref 70–99)
Glucose-Capillary: 108 mg/dL — ABNORMAL HIGH (ref 70–99)

## 2020-06-06 SURGERY — BREAST LUMPECTOMY WITH RADIOACTIVE SEED LOCALIZATION
Anesthesia: General | Site: Breast | Laterality: Left

## 2020-06-06 MED ORDER — ACETAMINOPHEN 500 MG PO TABS
1000.0000 mg | ORAL_TABLET | ORAL | Status: AC
  Administered 2020-06-06: 1000 mg via ORAL

## 2020-06-06 MED ORDER — ONDANSETRON HCL 4 MG/2ML IJ SOLN
INTRAMUSCULAR | Status: DC | PRN
  Administered 2020-06-06 (×2): 4 mg via INTRAVENOUS

## 2020-06-06 MED ORDER — LACTATED RINGERS IV SOLN: INTRAVENOUS | Status: DC

## 2020-06-06 MED ORDER — BUPIVACAINE-EPINEPHRINE 0.25% -1:200000 IJ SOLN
INTRAMUSCULAR | Status: DC | PRN
  Administered 2020-06-06: 10 mL

## 2020-06-06 MED ORDER — LIDOCAINE 2% (20 MG/ML) 5 ML SYRINGE
INTRAMUSCULAR | Status: DC | PRN
  Administered 2020-06-06: 40 mg via INTRAVENOUS

## 2020-06-06 MED ORDER — ACETAMINOPHEN 500 MG PO TABS
ORAL_TABLET | ORAL | Status: AC
  Filled 2020-06-06: qty 2

## 2020-06-06 MED ORDER — CEFAZOLIN SODIUM-DEXTROSE 2-4 GM/100ML-% IV SOLN
INTRAVENOUS | Status: AC
  Filled 2020-06-06: qty 100

## 2020-06-06 MED ORDER — CHLORHEXIDINE GLUCONATE CLOTH 2 % EX PADS: 6.0000 | MEDICATED_PAD | Freq: Once | CUTANEOUS | Status: DC

## 2020-06-06 MED ORDER — EPHEDRINE 5 MG/ML INJ
INTRAVENOUS | Status: AC
  Filled 2020-06-06: qty 10

## 2020-06-06 MED ORDER — LIDOCAINE 2% (20 MG/ML) 5 ML SYRINGE
INTRAMUSCULAR | Status: AC
  Filled 2020-06-06: qty 5

## 2020-06-06 MED ORDER — FENTANYL CITRATE (PF) 100 MCG/2ML IJ SOLN: 25.0000 ug | INTRAMUSCULAR | Status: DC | PRN

## 2020-06-06 MED ORDER — FENTANYL CITRATE (PF) 100 MCG/2ML IJ SOLN
INTRAMUSCULAR | Status: DC | PRN
  Administered 2020-06-06: 50 ug via INTRAVENOUS

## 2020-06-06 MED ORDER — PROPOFOL 10 MG/ML IV BOLUS
INTRAVENOUS | Status: AC
  Filled 2020-06-06: qty 20

## 2020-06-06 MED ORDER — DEXAMETHASONE SODIUM PHOSPHATE 10 MG/ML IJ SOLN
INTRAMUSCULAR | Status: DC | PRN
  Administered 2020-06-06: 5 mg via INTRAVENOUS

## 2020-06-06 MED ORDER — FENTANYL CITRATE (PF) 100 MCG/2ML IJ SOLN
INTRAMUSCULAR | Status: AC
  Filled 2020-06-06: qty 2

## 2020-06-06 MED ORDER — CEFAZOLIN SODIUM-DEXTROSE 2-4 GM/100ML-% IV SOLN
2.0000 g | INTRAVENOUS | Status: AC
  Administered 2020-06-06: 2 g via INTRAVENOUS

## 2020-06-06 MED ORDER — GABAPENTIN 300 MG PO CAPS
ORAL_CAPSULE | ORAL | Status: AC
  Filled 2020-06-06: qty 1

## 2020-06-06 MED ORDER — GABAPENTIN 300 MG PO CAPS: 300.0000 mg | ORAL_CAPSULE | ORAL | Status: DC

## 2020-06-06 MED ORDER — PROPOFOL 10 MG/ML IV BOLUS
INTRAVENOUS | Status: DC | PRN
  Administered 2020-06-06: 140 mg via INTRAVENOUS

## 2020-06-06 SURGICAL SUPPLY — 38 items
BENZOIN TINCTURE PRP APPL 2/3 (GAUZE/BANDAGES/DRESSINGS) ×2 IMPLANT
BLADE HEX COATED 2.75 (ELECTRODE) ×2 IMPLANT
BLADE SURG 15 STRL LF DISP TIS (BLADE) ×1 IMPLANT
BLADE SURG 15 STRL SS (BLADE) ×1
CHLORAPREP W/TINT 26 (MISCELLANEOUS) ×2 IMPLANT
COVER BACK TABLE 60X90IN (DRAPES) ×2 IMPLANT
COVER MAYO STAND STRL (DRAPES) ×2 IMPLANT
COVER PROBE W GEL 5X96 (DRAPES) ×2 IMPLANT
DRAPE LAPAROTOMY 100X72 PEDS (DRAPES) ×2 IMPLANT
DRAPE UTILITY XL STRL (DRAPES) ×2 IMPLANT
DRSG TEGADERM 4X4.75 (GAUZE/BANDAGES/DRESSINGS) ×2 IMPLANT
ELECT REM PT RETURN 9FT ADLT (ELECTROSURGICAL) ×2
ELECTRODE REM PT RTRN 9FT ADLT (ELECTROSURGICAL) ×1 IMPLANT
GAUZE SPONGE 4X4 12PLY STRL LF (GAUZE/BANDAGES/DRESSINGS) ×2 IMPLANT
GLOVE SURG ENC MOIS LTX SZ6.5 (GLOVE) ×2 IMPLANT
GLOVE SURG ENC MOIS LTX SZ7 (GLOVE) ×2 IMPLANT
GLOVE SURG SYN 7.0 (GLOVE) ×2 IMPLANT
GLOVE SURG UNDER POLY LF SZ7 (GLOVE) ×4 IMPLANT
GLOVE SURG UNDER POLY LF SZ7.5 (GLOVE) ×2 IMPLANT
GOWN STRL REUS W/ TWL LRG LVL3 (GOWN DISPOSABLE) ×2 IMPLANT
GOWN STRL REUS W/TWL LRG LVL3 (GOWN DISPOSABLE) ×2
KIT MARKER MARGIN INK (KITS) ×2 IMPLANT
NEEDLE HYPO 25X1 1.5 SAFETY (NEEDLE) ×2 IMPLANT
NS IRRIG 1000ML POUR BTL (IV SOLUTION) ×2 IMPLANT
PACK BASIN DAY SURGERY FS (CUSTOM PROCEDURE TRAY) ×2 IMPLANT
PENCIL SMOKE EVACUATOR (MISCELLANEOUS) ×2 IMPLANT
SLEEVE SCD COMPRESS KNEE MED (STOCKING) ×2 IMPLANT
SPONGE LAP 4X18 RFD (DISPOSABLE) ×2 IMPLANT
STRIP CLOSURE SKIN 1/2X4 (GAUZE/BANDAGES/DRESSINGS) ×2 IMPLANT
SUT MON AB 4-0 PC3 18 (SUTURE) ×2 IMPLANT
SUT SILK 2 0 SH (SUTURE) IMPLANT
SUT VIC AB 3-0 SH 27 (SUTURE) ×1
SUT VIC AB 3-0 SH 27X BRD (SUTURE) ×1 IMPLANT
SYR BULB EAR ULCER 3OZ GRN STR (SYRINGE) IMPLANT
SYR CONTROL 10ML LL (SYRINGE) ×2 IMPLANT
TOWEL GREEN STERILE FF (TOWEL DISPOSABLE) ×2 IMPLANT
TRAY FAXITRON CT DISP (TRAY / TRAY PROCEDURE) ×2 IMPLANT
TUBE CONNECTING 20X1/4 (TUBING) IMPLANT

## 2020-06-06 NOTE — Transfer of Care (Signed)
Immediate Anesthesia Transfer of Care Note  Patient: Robin Castro  Procedure(s) Performed: LEFT BREAST LUMPECTOMY WITH RADIOACTIVE SEED LOCALIZATION (Left Breast)  Patient Location: PACU  Anesthesia Type:General  Level of Consciousness: drowsy  Airway & Oxygen Therapy: Patient Spontanous Breathing and Patient connected to face mask oxygen  Post-op Assessment: Report given to RN and Post -op Vital signs reviewed and stable  Post vital signs: Reviewed and stable  Last Vitals:  Vitals Value Taken Time  BP    Temp    Pulse 70 06/06/20 0932  Resp 15 06/06/20 0932  SpO2 100 % 06/06/20 0932  Vitals shown include unvalidated device data.  Last Pain:  Vitals:   06/06/20 0720  TempSrc: Oral  PainSc: 0-No pain         Complications: No complications documented.

## 2020-06-06 NOTE — Anesthesia Postprocedure Evaluation (Signed)
Anesthesia Post Note  Patient: Robin Castro  Procedure(s) Performed: LEFT BREAST LUMPECTOMY WITH RADIOACTIVE SEED LOCALIZATION (Left Breast)     Patient location during evaluation: PACU Anesthesia Type: General Level of consciousness: awake and alert Pain management: pain level controlled Vital Signs Assessment: post-procedure vital signs reviewed and stable Respiratory status: spontaneous breathing, nonlabored ventilation, respiratory function stable and patient connected to nasal cannula oxygen Cardiovascular status: blood pressure returned to baseline and stable Postop Assessment: no apparent nausea or vomiting Anesthetic complications: no   No complications documented.  Last Vitals:  Vitals:   06/06/20 0945 06/06/20 0956  BP: (!) 119/58 (!) 125/59  Pulse: (!) 59 61  Resp: 12 15  Temp:  36.6 C  SpO2: 93% 98%    Last Pain:  Vitals:   06/06/20 0956  TempSrc:   PainSc: 0-No pain                 Effie Berkshire

## 2020-06-06 NOTE — Anesthesia Procedure Notes (Signed)
Procedure Name: LMA Insertion Date/Time: 06/06/2020 8:49 AM Performed by: Gwyndolyn Saxon, CRNA Pre-anesthesia Checklist: Patient identified, Emergency Drugs available, Suction available and Patient being monitored Patient Re-evaluated:Patient Re-evaluated prior to induction Oxygen Delivery Method: Circle system utilized Preoxygenation: Pre-oxygenation with 100% oxygen Induction Type: IV induction Ventilation: Mask ventilation without difficulty LMA: LMA inserted LMA Size: 4.0 Number of attempts: 1 Placement Confirmation: positive ETCO2 and breath sounds checked- equal and bilateral Tube secured with: Tape Dental Injury: Teeth and Oropharynx as per pre-operative assessment

## 2020-06-06 NOTE — Op Note (Addendum)
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 Assistant:  Carlena Hurl, PA-C Anesthesia:  GEN - LMA Indications:  This is a 80 year old female who presents with several months of focal areas of tenderness within the left breast. She is most tender in the left upper outer quadrant. She underwent mammogram and ultrasound in December. This showed an area of suspicion in the left breast at 2:00 7 cm from the nipple corresponding to an area of focal tenderness. Subsequently she underwent ultrasound-guided biopsy. Biopsy showed benign breast tissue with stromal fibrosis with differential including sclerotic fibroadenoma and sclerotic papilloma. No atypia identified. She was recommended to have this area excised due to the associated tenderness. No previous breast problems. No family history of breast cancer.  Description of procedure: The patient is brought to the operating room placed in supine position on the operating room table. After an adequate level of general anesthesia was obtained, her left lateral breast was prepped with ChloraPrep and draped in sterile fashion. A timeout was taken to ensure the proper patient and proper procedure. We interrogated the breast with the neoprobe. We made a transverse incision over the area of activity in the LUOQ after infiltrating with 0.25% Marcaine. Dissection was carried down in the breast tissue with cautery. We used the neoprobe to guide [place] towards the radioactive seed. We excised an area of tissue around the radioactive seed 1.5 cm in diameter. The specimen was removed and was oriented with a paint kit. Specimen mammogram showed the radioactive seed as well as the biopsy clip within the specimen. This was sent for pathologic examination. There is no residual radioactivity within the biopsy cavity. We inspected carefully  for hemostasis. The wound was thoroughly irrigated. The wound was closed with a deep layer of 3-0 Vicryl and a subcuticular layer of 4-0 Monocryl. Benzoin Steri-Strips were applied. The patient was then extubated and brought to the recovery room in stable condition. All sponge, instrument, and needle counts are correct.  Robin Castro. Georgette Dover, MD, [facility]  General/ Trauma Surgery  06/06/2020 9:14 AM

## 2020-06-06 NOTE — Interval H&P Note (Signed)
History and Physical Interval Note:  06/06/2020 7:44 AM  Robin Castro  has presented today for surgery, with the diagnosis of LEFT BREAST MASS.  The various methods of treatment have been discussed with the patient and family. After consideration of risks, benefits and other options for treatment, the patient has consented to  Procedure(s) with comments: LEFT BREAST LUMPECTOMY WITH RADIOACTIVE SEED LOCALIZATION (Left) - LMA; START TIME OF 8:30 AM FOR 60 MINUTES ROOM 8 as a surgical intervention.  The patient's history has been reviewed, patient examined, no change in status, stable for surgery.  I have reviewed the patient's chart and labs.  Questions were answered to the patient's satisfaction.     [name]

## 2020-06-06 NOTE — Discharge Instructions (Signed)
Robin Castro Office Phone Number [phone]  BREAST BIOPSY/ PARTIAL MASTECTOMY: POST OP INSTRUCTIONS  Always review your discharge instruction sheet given to you by the facility where your surgery was performed.  IF YOU HAVE DISABILITY OR FAMILY LEAVE FORMS, YOU MUST BRING THEM TO THE OFFICE FOR PROCESSING.  DO NOT GIVE THEM TO YOUR DOCTOR.  1. A prescription for pain medication may be given to you upon discharge.  Take your pain medication as prescribed, if needed.  If narcotic pain medicine is not needed, then you may take acetaminophen (Tylenol) or ibuprofen (Advil) as needed. 2. Take your usually prescribed medications unless otherwise directed 3. If you need a refill on your pain medication, please contact your pharmacy.  They will contact our office to request authorization.  Prescriptions will not be filled after 5pm or on week-ends. 4. You should eat very light the first 24 hours after surgery, such as soup, crackers, pudding, etc.  Resume your normal diet the day after surgery. 5. Most patients will experience some swelling and bruising in the breast.  Ice packs and a good support bra will help.  Swelling and bruising can take several days to resolve.  6. It is common to experience some constipation if taking pain medication after surgery.  Increasing fluid intake and taking a stool softener will usually help or prevent this problem from occurring.  A mild laxative (Milk of Magnesia or Miralax) should be taken according to package directions if there are no bowel movements after 48 hours. 7. Unless discharge instructions indicate otherwise, you may remove your bandages 24-48 hours after surgery, and you may shower at that time.  You may have steri-strips (small skin tapes) in place directly over the incision.  These strips should be left on the skin for 7-10 days.  If your surgeon used skin glue on the incision, you may shower in 24 hours.  The glue will flake off over the  next 2-3 weeks.  Any sutures or staples will be removed at the office during your follow-up visit. 8. ACTIVITIES:  You may resume regular daily activities (gradually increasing) beginning the next day.  Wearing a good support bra or sports bra minimizes pain and swelling.  You may have sexual intercourse when it is comfortable. a. You may drive when you no longer are taking prescription pain medication, you can comfortably wear a seatbelt, and you can safely maneuver your car and apply brakes. b. RETURN TO WORK:  ______________________________________________________________________________________ 9. You should see your doctor in the office for a follow-up appointment approximately two weeks after your surgery.  Your doctor's nurse will typically make your follow-up appointment when she calls you with your pathology report.  Expect your pathology report 2-3 business days after your surgery.  You may call to check if you do not hear from [place] after three days. 10. OTHER INSTRUCTIONS: _______________________________________________________________________________________________ _____________________________________________________________________________________________________________________________________ _____________________________________________________________________________________________________________________________________ _____________________________________________________________________________________________________________________________________  WHEN TO CALL YOUR DOCTOR: 1. Fever over 101.0 2. Nausea and/or vomiting. 3. Extreme swelling or bruising. 4. Continued bleeding from incision. 5. Increased pain, redness, or drainage from the incision.  The clinic staff is available to answer your questions during regular business hours.  Please don't hesitate to call and ask to speak to one of the nurses for clinical concerns.  If you have a medical emergency, go to the nearest  emergency room or call 911.  A surgeon from [facility] is always on call at the hospital.  For further questions, please visit [email or web address]  Post Anesthesia Home Care Instructions  Activity: Get plenty of rest for the remainder of the day. A responsible individual must stay with you for 24 hours following the procedure.  For the next 24 hours, DO NOT: -Drive a car -Paediatric nurse -Drink alcoholic beverages -Take any medication unless instructed by your physician -Make any legal decisions or sign important papers.  Meals: Start with liquid foods such as gelatin or soup. Progress to regular foods as tolerated. Avoid greasy, spicy, heavy foods. If nausea and/or vomiting occur, drink only clear liquids until the nausea and/or vomiting subsides. Call your physician if vomiting continues.  Special Instructions/Symptoms: Your throat may feel dry or sore from the anesthesia or the breathing tube placed in your throat during surgery. If this causes discomfort, gargle with warm salt water. The discomfort should disappear within 24 hours.  If you had a scopolamine patch placed behind your ear for the management of post- operative nausea and/or vomiting:  1. The medication in the patch is effective for 72 hours, after which it should be removed.  Wrap patch in a tissue and discard in the trash. Wash hands thoroughly with soap and water. 2. You may remove the patch earlier than 72 hours if you experience unpleasant side effects which may include dry mouth, dizziness or visual disturbances. 3. Avoid touching the patch. Wash your hands with soap and water after contact with the patch.    No Tylenol until 1:30 PM.

## 2020-06-06 NOTE — Anesthesia Preprocedure Evaluation (Addendum)
Anesthesia Evaluation  Patient identified by MRN, date of birth, ID band Patient awake    Reviewed: Allergy & Precautions, NPO status , Patient's Chart, lab work & pertinent test results  History of Anesthesia Complications (+) PONV and history of anesthetic complications  Airway Mallampati: I  TM Distance: >3 FB Neck ROM: Full    Dental  (+) Teeth Intact, Dental Advisory Given   Pulmonary neg pulmonary ROS,    breath sounds clear to auscultation       Cardiovascular hypertension, + Peripheral Vascular Disease  + dysrhythmias  Rhythm:Regular Rate:Normal     Neuro/Psych  Neuromuscular disease negative psych ROS   GI/Hepatic Neg liver ROS, GERD  Medicated,  Endo/Other  diabetes, Type 2, Oral Hypoglycemic Agents  Renal/GU negative Renal ROS     Musculoskeletal negative musculoskeletal ROS (+)   Abdominal Normal abdominal exam  (+)   Peds  Hematology negative hematology ROS (+)   Anesthesia Other Findings   Reproductive/Obstetrics                            Anesthesia Physical Anesthesia Plan  ASA: II  Anesthesia Plan: General   Post-op Pain Management:    Induction: Intravenous  PONV Risk Score and Plan: 4 or greater and Ondansetron, Dexamethasone, Amisulpride and Treatment may vary due to age or medical condition  Airway Management Planned: LMA  Additional Equipment: None  Intra-op Plan:   Post-operative Plan: Extubation in OR  Informed Consent: I have reviewed the patients History and Physical, chart, labs and discussed the procedure including the risks, benefits and alternatives for the proposed anesthesia with the patient or authorized representative who has indicated his/her understanding and acceptance.     Dental advisory given  Plan Discussed with: CRNA  Anesthesia Plan Comments:        Anesthesia Quick Evaluation

## 2020-06-07 ENCOUNTER — Encounter (HOSPITAL_BASED_OUTPATIENT_CLINIC_OR_DEPARTMENT_OTHER): Payer: Self-pay | Admitting: Surgery

## 2020-06-07 LAB — SURGICAL PATHOLOGY

## 2020-06-27 ENCOUNTER — Other Ambulatory Visit: Payer: Self-pay | Admitting: Cardiology

## 2020-07-02 DIAGNOSIS — Z6823 Body mass index (BMI) 23.0-23.9, adult: Secondary | ICD-10-CM | POA: Diagnosis not present

## 2020-07-02 DIAGNOSIS — J302 Other seasonal allergic rhinitis: Secondary | ICD-10-CM | POA: Diagnosis not present

## 2020-07-02 DIAGNOSIS — J329 Chronic sinusitis, unspecified: Secondary | ICD-10-CM | POA: Diagnosis not present

## 2020-07-04 DIAGNOSIS — E1129 Type 2 diabetes mellitus with other diabetic kidney complication: Secondary | ICD-10-CM | POA: Diagnosis not present

## 2020-07-04 DIAGNOSIS — D510 Vitamin B12 deficiency anemia due to intrinsic factor deficiency: Secondary | ICD-10-CM | POA: Diagnosis not present

## 2020-07-04 DIAGNOSIS — E559 Vitamin D deficiency, unspecified: Secondary | ICD-10-CM | POA: Diagnosis not present

## 2020-07-11 DIAGNOSIS — D510 Vitamin B12 deficiency anemia due to intrinsic factor deficiency: Secondary | ICD-10-CM | POA: Diagnosis not present

## 2020-07-31 DIAGNOSIS — N3091 Cystitis, unspecified with hematuria: Secondary | ICD-10-CM | POA: Diagnosis not present

## 2020-08-04 DIAGNOSIS — E1129 Type 2 diabetes mellitus with other diabetic kidney complication: Secondary | ICD-10-CM | POA: Diagnosis not present

## 2020-08-04 DIAGNOSIS — D510 Vitamin B12 deficiency anemia due to intrinsic factor deficiency: Secondary | ICD-10-CM | POA: Diagnosis not present

## 2020-08-04 DIAGNOSIS — E559 Vitamin D deficiency, unspecified: Secondary | ICD-10-CM | POA: Diagnosis not present

## 2020-08-15 DIAGNOSIS — N3091 Cystitis, unspecified with hematuria: Secondary | ICD-10-CM | POA: Diagnosis not present

## 2020-08-15 DIAGNOSIS — R300 Dysuria: Secondary | ICD-10-CM | POA: Diagnosis not present

## 2020-08-24 DIAGNOSIS — Z6823 Body mass index (BMI) 23.0-23.9, adult: Secondary | ICD-10-CM | POA: Diagnosis not present

## 2020-08-24 DIAGNOSIS — N390 Urinary tract infection, site not specified: Secondary | ICD-10-CM | POA: Diagnosis not present

## 2020-08-24 DIAGNOSIS — E7800 Pure hypercholesterolemia, unspecified: Secondary | ICD-10-CM | POA: Diagnosis not present

## 2020-09-03 DIAGNOSIS — E559 Vitamin D deficiency, unspecified: Secondary | ICD-10-CM | POA: Diagnosis not present

## 2020-09-03 DIAGNOSIS — D510 Vitamin B12 deficiency anemia due to intrinsic factor deficiency: Secondary | ICD-10-CM | POA: Diagnosis not present

## 2020-09-03 DIAGNOSIS — E1129 Type 2 diabetes mellitus with other diabetic kidney complication: Secondary | ICD-10-CM | POA: Diagnosis not present

## 2020-09-10 DIAGNOSIS — I471 Supraventricular tachycardia: Secondary | ICD-10-CM | POA: Diagnosis not present

## 2020-09-10 DIAGNOSIS — N390 Urinary tract infection, site not specified: Secondary | ICD-10-CM | POA: Diagnosis not present

## 2020-09-10 DIAGNOSIS — Z6823 Body mass index (BMI) 23.0-23.9, adult: Secondary | ICD-10-CM | POA: Diagnosis not present

## 2020-09-24 ENCOUNTER — Other Ambulatory Visit: Payer: Self-pay | Admitting: Cardiology

## 2020-09-25 DIAGNOSIS — N958 Other specified menopausal and perimenopausal disorders: Secondary | ICD-10-CM | POA: Diagnosis not present

## 2020-09-25 DIAGNOSIS — R399 Unspecified symptoms and signs involving the genitourinary system: Secondary | ICD-10-CM | POA: Diagnosis not present

## 2020-09-25 DIAGNOSIS — N390 Urinary tract infection, site not specified: Secondary | ICD-10-CM | POA: Diagnosis not present

## 2020-09-25 DIAGNOSIS — N952 Postmenopausal atrophic vaginitis: Secondary | ICD-10-CM | POA: Diagnosis not present

## 2020-09-25 DIAGNOSIS — R8290 Unspecified abnormal findings in urine: Secondary | ICD-10-CM | POA: Diagnosis not present

## 2020-09-25 DIAGNOSIS — R350 Frequency of micturition: Secondary | ICD-10-CM | POA: Diagnosis not present

## 2020-09-25 DIAGNOSIS — R319 Hematuria, unspecified: Secondary | ICD-10-CM | POA: Diagnosis not present

## 2020-09-25 DIAGNOSIS — N3000 Acute cystitis without hematuria: Secondary | ICD-10-CM | POA: Diagnosis not present

## 2020-09-27 DIAGNOSIS — N390 Urinary tract infection, site not specified: Secondary | ICD-10-CM | POA: Diagnosis not present

## 2020-09-27 DIAGNOSIS — R319 Hematuria, unspecified: Secondary | ICD-10-CM | POA: Diagnosis not present

## 2020-09-27 DIAGNOSIS — N200 Calculus of kidney: Secondary | ICD-10-CM | POA: Diagnosis not present

## 2020-10-01 DIAGNOSIS — D510 Vitamin B12 deficiency anemia due to intrinsic factor deficiency: Secondary | ICD-10-CM | POA: Diagnosis not present

## 2020-10-15 DIAGNOSIS — N952 Postmenopausal atrophic vaginitis: Secondary | ICD-10-CM | POA: Diagnosis not present

## 2020-10-15 DIAGNOSIS — N200 Calculus of kidney: Secondary | ICD-10-CM | POA: Diagnosis not present

## 2020-10-15 DIAGNOSIS — R399 Unspecified symptoms and signs involving the genitourinary system: Secondary | ICD-10-CM | POA: Diagnosis not present

## 2020-10-15 DIAGNOSIS — N390 Urinary tract infection, site not specified: Secondary | ICD-10-CM | POA: Diagnosis not present

## 2020-10-15 DIAGNOSIS — R319 Hematuria, unspecified: Secondary | ICD-10-CM | POA: Diagnosis not present

## 2020-10-15 DIAGNOSIS — N958 Other specified menopausal and perimenopausal disorders: Secondary | ICD-10-CM | POA: Diagnosis not present

## 2020-10-15 HISTORY — DX: Calculus of kidney: N20.0

## 2020-10-30 ENCOUNTER — Other Ambulatory Visit: Payer: Self-pay

## 2020-10-30 DIAGNOSIS — E1129 Type 2 diabetes mellitus with other diabetic kidney complication: Secondary | ICD-10-CM | POA: Diagnosis not present

## 2020-10-30 DIAGNOSIS — N200 Calculus of kidney: Secondary | ICD-10-CM | POA: Diagnosis not present

## 2020-10-30 DIAGNOSIS — R600 Localized edema: Secondary | ICD-10-CM | POA: Diagnosis not present

## 2020-10-30 DIAGNOSIS — Z6823 Body mass index (BMI) 23.0-23.9, adult: Secondary | ICD-10-CM | POA: Diagnosis not present

## 2020-11-01 DIAGNOSIS — E559 Vitamin D deficiency, unspecified: Secondary | ICD-10-CM | POA: Diagnosis not present

## 2020-11-02 ENCOUNTER — Other Ambulatory Visit: Payer: Self-pay

## 2020-11-02 ENCOUNTER — Ambulatory Visit: Payer: PPO | Admitting: Cardiology

## 2020-11-02 ENCOUNTER — Encounter: Payer: Self-pay | Admitting: Cardiology

## 2020-11-02 VITALS — BP 120/77 | HR 77 | Ht 65.0 in | Wt 141.0 lb

## 2020-11-02 DIAGNOSIS — I471 Supraventricular tachycardia: Secondary | ICD-10-CM

## 2020-11-02 DIAGNOSIS — E119 Type 2 diabetes mellitus without complications: Secondary | ICD-10-CM

## 2020-11-02 DIAGNOSIS — I10 Essential (primary) hypertension: Secondary | ICD-10-CM | POA: Diagnosis not present

## 2020-11-02 DIAGNOSIS — I739 Peripheral vascular disease, unspecified: Secondary | ICD-10-CM

## 2020-11-02 DIAGNOSIS — E782 Mixed hyperlipidemia: Secondary | ICD-10-CM | POA: Diagnosis not present

## 2020-11-02 NOTE — Patient Instructions (Signed)

## 2020-11-02 NOTE — Progress Notes (Signed)
[facility] Office Note:    Date:  11/02/2020   ID:  Robin Castro 04/09/1940, MRN [id number]  PCP:  Angelina Sheriff, MD  Cardiologist:  Jenne Campus, MD    Referring MD: Angelina Sheriff, MD   Chief Complaint  Patient presents with   clearance 12/13/2020    Kidney stone removal, Dr. Barnet Glasgow    History of Present Illness:    Robin Castro is a 80 y.o. female with past medical history significant for paroxysmal supraventricular tachycardia now successfully suppressed with Cardizem, peripheral vascular disease with up to 60% right internal carotic artery stenosis asymptomatic, anemia, type 2 diabetes.  She comes today to my office for follow-up denies have any palpitations.  She is very active she walks about 3 miles every single day she has no difficulty doing it.  She is scheduled to have kidney stone surgery admitted this visit serves as evaluation before that surgery.  She denies have any cardiac complaints there is no palpitation no dizziness no passing out no chest pain tightness squeezing pressure burning chest no swelling of lower extremities no dyspnea on exertion more than usual  Past Medical History:  Diagnosis Date   Chest discomfort    nl myoview 2010   Diabetes mellitus    type 2- x 12 years   Diabetes mellitus ([place]) 05/31/2018   Dysrhythmia    h/o rapid heart beat   GERD (gastroesophageal reflux disease)    History of cystocele 01/09/2020   Hypercholesteremia    Hyperlipidemia 10/21/2010   Left sided sciatica 05/08/2016   Other symptoms involving cardiovascular system 02/28/2009   Qualifier: Diagnosis of  By: Al-Rammal, RVT, RDCS, Missy     Pain in pelvis 01/09/2020   Palpitations    Paroxysmal SVT (supraventricular tachycardia) ([place]) 05/11/2018   Peripheral vascular disease, unspecified ([place]) up to 60% right internal carotid artery 07/05/2019   PONV (postoperative nausea and vomiting)    Postoperative state 04/19/2014   Pulsatile abdominal  mass    neg U/S 2010   S/P TKR (total knee replacement) 09/30/2012   Second degree uterine prolapse 01/09/2020   SVD (spontaneous vaginal delivery)    x 2    Past Surgical History:  Procedure Laterality Date   APPENDECTOMY     BILATERAL SALPINGECTOMY  04/19/2014   Procedure: BILATERAL SALPINGECTOMY;  Surgeon: Daria Pastures, MD;  Location: [facility];  Service: Gynecology;;   BLADDER SUSPENSION N/A 04/19/2014   Procedure: TRANSVAGINAL TAPE (TVT) PROCEDURE;  Surgeon: Daria Pastures, MD;  Location: [facility];  Service: Gynecology;  Laterality: N/A;   BREAST BIOPSY Left    BREAST LUMPECTOMY WITH RADIOACTIVE SEED LOCALIZATION Left 06/06/2020   Procedure: LEFT BREAST LUMPECTOMY WITH RADIOACTIVE SEED LOCALIZATION;  Surgeon: Donnie Mesa, MD;  Location: [place];  Service: General;  Laterality: Left;   COLONOSCOPY     CYSTOCELE REPAIR N/A 04/19/2014   Procedure: ANTERIOR REPAIR (CYSTOCELE);  Surgeon: Daria Pastures, MD;  Location: [facility];  Service: Gynecology;  Laterality: N/A;   CYSTOSCOPY N/A 04/19/2014   Procedure: CYSTOSCOPY;  Surgeon: Daria Pastures, MD;  Location: [facility];  Service: Gynecology;  Laterality: N/A;   TOTAL KNEE ARTHROPLASTY     VAGINAL HYSTERECTOMY N/A 04/19/2014   Procedure: HYSTERECTOMY VAGINAL;  Surgeon: Daria Pastures, MD;  Location: [facility];  Service: Gynecology;  Laterality: N/A;    Current Medications: Current Meds  Medication Sig   aspirin 81 MG tablet Take 81 mg by  mouth daily.   benzonatate (TESSALON) 200 MG capsule Take 200 mg by mouth 3 (three) times daily as needed for cough.   Calcium Carbonate-Vitamin D (CALCIUM 600 + D PO) Take 1 tablet by mouth 2 (two) times daily.   cyanocobalamin (,VITAMIN B-12,) 1000 MCG/ML injection Inject 1,000 mcg into the muscle every 30 (thirty) days.   diazepam (VALIUM) 5 MG tablet Take 5 mg by mouth as needed for anxiety or sleep.   diltiazem (CARDIZEM CD) 180 MG 24 hr capsule Take 1 capsule by mouth  once daily (Patient taking differently: Take 180 mg by mouth daily. Take 1 capsule by mouth once daily)   ergocalciferol (VITAMIN D2) 50000 UNITS capsule Take 50,000 Units by mouth every 14 (fourteen) days.   estradiol (ESTRACE) 0.1 MG/GM vaginal cream Place 1 g vaginally in the morning, at noon, and at bedtime.   fenofibrate (TRICOR) 145 MG tablet Take 145 mg by mouth daily.   furosemide (LASIX) 20 MG tablet Take 20 mg by mouth daily.   losartan (COZAAR) 25 MG tablet Take 25 mg by mouth daily.   metFORMIN (GLUCOPHAGE) 1000 MG tablet Take 1,000 mg by mouth 2 (two) times daily with a meal.   omeprazole (PRILOSEC) 20 MG capsule Take 20 mg by mouth as needed (Reflux).   omeprazole (PRILOSEC) 20 MG capsule Take 20 mg by mouth daily.   rosuvastatin (CRESTOR) 5 MG tablet Take 1 tablet (5 mg total) by mouth daily.     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  Tobacco Use   Smoking status: Never   Smokeless tobacco: Never  Vaping Use   Vaping Use: Never used  Substance and Sexual Activity   Alcohol use: No   Drug use: No   Sexual activity: Yes    Birth control/protection: Post-menopausal  Other Topics Concern   Not on file  Social History Narrative   Not on file   Social Determinants of Health   Financial Resource Strain: Not on file  Food Insecurity: Not on file  Transportation Needs: Not on file  Physical Activity: Not on file  Stress: Not on file  Social Connections: Not on file     Family History: The patient's family history includes Diabetes in an other family member; Heart disease in an other family member; Hyperlipidemia in an other family member; Hypertension in an other family  member. ROS:   Please see the history of present illness.    All 14 point review of systems negative except as described per history of present illness  EKGs/Labs/Other Studies Reviewed:      Recent Labs: 06/04/2020: BUN 10; Creatinine, Ser 0.65; Potassium 4.6; Sodium 139  Recent Lipid Panel    Component Value Date/Time   CHOL 150 03/22/2019 0811   TRIG 87 03/22/2019 0811   HDL 49 03/22/2019 0811   CHOLHDL 3.1 03/22/2019 0811   LDLCALC 85 03/22/2019 0811    Physical Exam:    VS:  BP 120/77 (BP Location: Right Arm, Patient Position: Sitting)   Pulse 77   Ht '5\' 5"'$  (1.651 m)   Wt 141 lb (64 kg)   SpO2 94%   BMI 23.46 kg/m  Wt Readings from Last 3 Encounters:  11/02/20 141 lb (64 kg)  06/06/20 140 lb 14 oz (63.9 kg)  05/11/20 140 lb (63.5 kg)     GEN:  Well nourished, well developed in no acute distress HEENT: Normal NECK: No JVD; No carotid bruits LYMPHATICS: No lymphadenopathy CARDIAC: RRR, no murmurs, no rubs, no gallops RESPIRATORY:  Clear to auscultation without rales, wheezing or rhonchi  ABDOMEN: Soft, non-tender, non-distended MUSCULOSKELETAL:  No edema; No deformity  SKIN: Warm and dry LOWER EXTREMITIES: no swelling NEUROLOGIC:  Alert and oriented x 3 PSYCHIATRIC:  Normal affect   ASSESSMENT:    1. Hypertension, unspecified type   2. Paroxysmal SVT (supraventricular tachycardia) ([place])   3. Peripheral vascular disease, unspecified ([place]) up to 60% right internal carotid artery   4. Type 2 diabetes mellitus without complication, without long-term current use of insulin ([place])   5. Mixed hyperlipidemia    PLAN:    In order of problems listed above:  Supraventricular tachycardia successfully suppressed with beta-blocker which caused some child blocker which I will continue. Peripheral vascular disease times repeat carotic ultrasounds which I will schedule her to have in the meantime we will continue antiplatelet therapy with aspirin as well as  statin. Diabetes has been followed by antimedicine team I did review K PN which show me last data from just few days ago with hemoglobin A1c 6.3. Dyslipidemia: She is on Crestor 25 mg daily.  I got K PN from primary care physician about her I do not have direct LDL will call the office to get report. Cardiovascular preop evaluation for kidney stones as she is seen no objections from cardiac standpoint to be to perform at she is in excellent shape she walks on the regular basis her exercise tolerance is good overall surgery is considered low risk and she should be fine.   Medication Adjustments/Labs and Tests Ordered: Current medicines are reviewed at length with the patient today.  Concerns regarding medicines are outlined above.  Orders Placed This Encounter  Procedures   EKG 12-Lead   Medication changes: No orders of the defined types were placed in this encounter.   Signed, Park Liter, MD, [facility] 11/02/2020 9:04 AM    [place]

## 2020-11-04 DIAGNOSIS — J0140 Acute pansinusitis, unspecified: Secondary | ICD-10-CM | POA: Diagnosis not present

## 2020-11-04 DIAGNOSIS — E1129 Type 2 diabetes mellitus with other diabetic kidney complication: Secondary | ICD-10-CM | POA: Diagnosis not present

## 2020-11-04 DIAGNOSIS — E559 Vitamin D deficiency, unspecified: Secondary | ICD-10-CM | POA: Diagnosis not present

## 2020-11-04 DIAGNOSIS — D510 Vitamin B12 deficiency anemia due to intrinsic factor deficiency: Secondary | ICD-10-CM | POA: Diagnosis not present

## 2020-11-22 DIAGNOSIS — E119 Type 2 diabetes mellitus without complications: Secondary | ICD-10-CM | POA: Diagnosis not present

## 2020-11-22 DIAGNOSIS — N200 Calculus of kidney: Secondary | ICD-10-CM | POA: Diagnosis not present

## 2020-11-22 DIAGNOSIS — I4510 Unspecified right bundle-branch block: Secondary | ICD-10-CM | POA: Diagnosis not present

## 2020-11-22 DIAGNOSIS — Z8679 Personal history of other diseases of the circulatory system: Secondary | ICD-10-CM | POA: Diagnosis not present

## 2020-11-22 DIAGNOSIS — Z01810 Encounter for preprocedural cardiovascular examination: Secondary | ICD-10-CM | POA: Diagnosis not present

## 2020-11-22 DIAGNOSIS — Z01812 Encounter for preprocedural laboratory examination: Secondary | ICD-10-CM | POA: Diagnosis not present

## 2020-11-26 ENCOUNTER — Telehealth: Payer: Self-pay

## 2020-11-26 NOTE — Telephone Encounter (Signed)
Pre-op clearance completed as below and faxed to surgeon. However, in recent OV Dr. Agustin Cree mentions scheduling f/u carotid dopplers. I do not see these ordered or pending so will route to MD for review and communication with his nurse if they were intended. Otherwise will remove from preop box.

## 2020-11-26 NOTE — Telephone Encounter (Signed)
   [facility] Pre-operative Risk Assessment    Request for surgical clearance:  What type of surgery is being performed? Lithotripsy possible ureteral stent placement   When is this surgery scheduled? 12/13/2020   What type of clearance is required (medical clearance vs. Pharmacy clearance to hold med vs. Both)? Both  Are there any medications that need to be held prior to surgery and how long?Not specified however, the patient is on ASA 35m   Practice name and name of physician performing surgery? [facility] your office phone number: [phone]   7.   What is your office fax number: [phone] 8.   Anesthesia type (None, local, MAC, general) ? Not specified   P.S- The office is requesting a copy of the most recent office visit  notes, Echo , labs and stress test.    JBasil DessPrevatt 11/26/2020, 9:48 AM  _________________________________________________________________   (provider comments below)

## 2020-11-26 NOTE — Telephone Encounter (Signed)
   Patient Name: Robin Castro  DOB: Sep 07, 1940 MRN: [id number]  Primary Cardiologist: Jenne Campus, MD  Chart reviewed as part of pre-operative protocol coverage. Patient was recently seen in clinic by Dr. Agustin Cree 11/02/20 for pre-op clearance for kidney stone surgery and was cleared to proceed. I will fax a copy of his note. She has no history of MI, PCI or stroke so from cardiac standpoint, no acute contraindication to holding aspirin if surgeon requests this. I reached out to patient for update on how she is doing. The patient affirms today by phone she has been doing well without any new cardiac symptoms. The patient was advised that if she develops new symptoms prior to surgery to contact our office to arrange for a follow-up visit, and she verbalized understanding.  Will route this bundled recommendation to requesting provider via Epic fax function. Please call with questions.  Charlie Pitter, PA-C 11/26/2020, 1:42 PM

## 2020-11-29 NOTE — Telephone Encounter (Signed)
Clarified with Dr. Raliegh Ip that note was regarding carotid dopplers mentioned in his prior note rather than additional clearance. He verbalized understanding and will address.

## 2020-12-04 ENCOUNTER — Other Ambulatory Visit: Payer: Self-pay

## 2020-12-04 DIAGNOSIS — R0989 Other specified symptoms and signs involving the circulatory and respiratory systems: Secondary | ICD-10-CM

## 2020-12-13 DIAGNOSIS — N2889 Other specified disorders of kidney and ureter: Secondary | ICD-10-CM | POA: Diagnosis not present

## 2020-12-13 DIAGNOSIS — N135 Crossing vessel and stricture of ureter without hydronephrosis: Secondary | ICD-10-CM | POA: Diagnosis not present

## 2020-12-13 DIAGNOSIS — I10 Essential (primary) hypertension: Secondary | ICD-10-CM | POA: Diagnosis not present

## 2020-12-13 DIAGNOSIS — Z888 Allergy status to other drugs, medicaments and biological substances status: Secondary | ICD-10-CM | POA: Diagnosis not present

## 2020-12-13 DIAGNOSIS — Z7982 Long term (current) use of aspirin: Secondary | ICD-10-CM | POA: Diagnosis not present

## 2020-12-13 DIAGNOSIS — K219 Gastro-esophageal reflux disease without esophagitis: Secondary | ICD-10-CM | POA: Diagnosis not present

## 2020-12-13 DIAGNOSIS — Z7984 Long term (current) use of oral hypoglycemic drugs: Secondary | ICD-10-CM | POA: Diagnosis not present

## 2020-12-13 DIAGNOSIS — Z79899 Other long term (current) drug therapy: Secondary | ICD-10-CM | POA: Diagnosis not present

## 2020-12-13 DIAGNOSIS — E119 Type 2 diabetes mellitus without complications: Secondary | ICD-10-CM | POA: Diagnosis not present

## 2020-12-13 DIAGNOSIS — N200 Calculus of kidney: Secondary | ICD-10-CM | POA: Diagnosis not present

## 2020-12-19 DIAGNOSIS — H524 Presbyopia: Secondary | ICD-10-CM | POA: Diagnosis not present

## 2020-12-24 DIAGNOSIS — N952 Postmenopausal atrophic vaginitis: Secondary | ICD-10-CM | POA: Diagnosis not present

## 2020-12-24 DIAGNOSIS — R399 Unspecified symptoms and signs involving the genitourinary system: Secondary | ICD-10-CM | POA: Diagnosis not present

## 2020-12-24 DIAGNOSIS — N200 Calculus of kidney: Secondary | ICD-10-CM | POA: Diagnosis not present

## 2020-12-24 DIAGNOSIS — N958 Other specified menopausal and perimenopausal disorders: Secondary | ICD-10-CM | POA: Diagnosis not present

## 2020-12-24 DIAGNOSIS — R319 Hematuria, unspecified: Secondary | ICD-10-CM | POA: Diagnosis not present

## 2020-12-24 DIAGNOSIS — N390 Urinary tract infection, site not specified: Secondary | ICD-10-CM | POA: Diagnosis not present

## 2020-12-31 ENCOUNTER — Other Ambulatory Visit: Payer: Self-pay | Admitting: Cardiology

## 2021-01-14 DIAGNOSIS — D510 Vitamin B12 deficiency anemia due to intrinsic factor deficiency: Secondary | ICD-10-CM | POA: Diagnosis not present

## 2021-01-21 DIAGNOSIS — R399 Unspecified symptoms and signs involving the genitourinary system: Secondary | ICD-10-CM | POA: Diagnosis not present

## 2021-01-21 DIAGNOSIS — R8290 Unspecified abnormal findings in urine: Secondary | ICD-10-CM | POA: Diagnosis not present

## 2021-01-21 DIAGNOSIS — N952 Postmenopausal atrophic vaginitis: Secondary | ICD-10-CM | POA: Diagnosis not present

## 2021-01-21 DIAGNOSIS — N3000 Acute cystitis without hematuria: Secondary | ICD-10-CM | POA: Diagnosis not present

## 2021-01-21 DIAGNOSIS — N958 Other specified menopausal and perimenopausal disorders: Secondary | ICD-10-CM | POA: Diagnosis not present

## 2021-01-21 DIAGNOSIS — R319 Hematuria, unspecified: Secondary | ICD-10-CM | POA: Diagnosis not present

## 2021-01-21 DIAGNOSIS — R350 Frequency of micturition: Secondary | ICD-10-CM | POA: Diagnosis not present

## 2021-01-21 DIAGNOSIS — N390 Urinary tract infection, site not specified: Secondary | ICD-10-CM | POA: Diagnosis not present

## 2021-01-21 DIAGNOSIS — N200 Calculus of kidney: Secondary | ICD-10-CM | POA: Diagnosis not present

## 2021-01-28 DIAGNOSIS — Z23 Encounter for immunization: Secondary | ICD-10-CM | POA: Diagnosis not present

## 2021-02-01 ENCOUNTER — Ambulatory Visit (INDEPENDENT_AMBULATORY_CARE_PROVIDER_SITE_OTHER): Payer: PPO

## 2021-02-01 ENCOUNTER — Other Ambulatory Visit: Payer: Self-pay

## 2021-02-01 DIAGNOSIS — R0989 Other specified symptoms and signs involving the circulatory and respiratory systems: Secondary | ICD-10-CM | POA: Diagnosis not present

## 2021-02-14 DIAGNOSIS — Z87440 Personal history of urinary (tract) infections: Secondary | ICD-10-CM | POA: Diagnosis not present

## 2021-02-14 DIAGNOSIS — K219 Gastro-esophageal reflux disease without esophagitis: Secondary | ICD-10-CM | POA: Diagnosis not present

## 2021-02-14 DIAGNOSIS — R52 Pain, unspecified: Secondary | ICD-10-CM | POA: Diagnosis not present

## 2021-02-14 DIAGNOSIS — N200 Calculus of kidney: Secondary | ICD-10-CM | POA: Diagnosis not present

## 2021-02-14 DIAGNOSIS — Z960 Presence of urogenital implants: Secondary | ICD-10-CM | POA: Diagnosis not present

## 2021-02-14 DIAGNOSIS — E119 Type 2 diabetes mellitus without complications: Secondary | ICD-10-CM | POA: Diagnosis not present

## 2021-02-14 DIAGNOSIS — Z79890 Hormone replacement therapy (postmenopausal): Secondary | ICD-10-CM | POA: Diagnosis not present

## 2021-02-14 DIAGNOSIS — N201 Calculus of ureter: Secondary | ICD-10-CM | POA: Diagnosis not present

## 2021-02-14 DIAGNOSIS — I10 Essential (primary) hypertension: Secondary | ICD-10-CM | POA: Diagnosis not present

## 2021-02-14 DIAGNOSIS — E785 Hyperlipidemia, unspecified: Secondary | ICD-10-CM | POA: Diagnosis not present

## 2021-02-14 DIAGNOSIS — Z888 Allergy status to other drugs, medicaments and biological substances status: Secondary | ICD-10-CM | POA: Diagnosis not present

## 2021-02-25 DIAGNOSIS — N390 Urinary tract infection, site not specified: Secondary | ICD-10-CM | POA: Diagnosis not present

## 2021-02-25 DIAGNOSIS — R350 Frequency of micturition: Secondary | ICD-10-CM | POA: Diagnosis not present

## 2021-02-25 DIAGNOSIS — R399 Unspecified symptoms and signs involving the genitourinary system: Secondary | ICD-10-CM | POA: Diagnosis not present

## 2021-02-25 DIAGNOSIS — N200 Calculus of kidney: Secondary | ICD-10-CM | POA: Diagnosis not present

## 2021-02-25 DIAGNOSIS — N952 Postmenopausal atrophic vaginitis: Secondary | ICD-10-CM | POA: Diagnosis not present

## 2021-02-25 DIAGNOSIS — N3000 Acute cystitis without hematuria: Secondary | ICD-10-CM | POA: Diagnosis not present

## 2021-02-25 DIAGNOSIS — R319 Hematuria, unspecified: Secondary | ICD-10-CM | POA: Diagnosis not present

## 2021-03-06 DIAGNOSIS — M810 Age-related osteoporosis without current pathological fracture: Secondary | ICD-10-CM | POA: Diagnosis not present

## 2021-03-06 DIAGNOSIS — E1129 Type 2 diabetes mellitus with other diabetic kidney complication: Secondary | ICD-10-CM | POA: Diagnosis not present

## 2021-03-06 DIAGNOSIS — E7800 Pure hypercholesterolemia, unspecified: Secondary | ICD-10-CM | POA: Diagnosis not present

## 2021-03-18 ENCOUNTER — Other Ambulatory Visit: Payer: Self-pay | Admitting: Cardiology

## 2021-03-18 NOTE — Telephone Encounter (Signed)
Diltiazem 180 mg # 90 only Rosuvastatin 5 mg # 90 only Sent to [place], [place]  Patient has follow up scheduled 05/09/2021

## 2021-03-21 ENCOUNTER — Other Ambulatory Visit: Payer: Self-pay | Admitting: Cardiology

## 2021-04-05 DIAGNOSIS — R051 Acute cough: Secondary | ICD-10-CM | POA: Diagnosis not present

## 2021-04-05 DIAGNOSIS — Z20828 Contact with and (suspected) exposure to other viral communicable diseases: Secondary | ICD-10-CM | POA: Diagnosis not present

## 2021-04-05 DIAGNOSIS — E1129 Type 2 diabetes mellitus with other diabetic kidney complication: Secondary | ICD-10-CM | POA: Diagnosis not present

## 2021-04-05 DIAGNOSIS — R509 Fever, unspecified: Secondary | ICD-10-CM | POA: Diagnosis not present

## 2021-04-05 DIAGNOSIS — M810 Age-related osteoporosis without current pathological fracture: Secondary | ICD-10-CM | POA: Diagnosis not present

## 2021-04-05 DIAGNOSIS — E7800 Pure hypercholesterolemia, unspecified: Secondary | ICD-10-CM | POA: Diagnosis not present

## 2021-04-12 DIAGNOSIS — E538 Deficiency of other specified B group vitamins: Secondary | ICD-10-CM | POA: Diagnosis not present

## 2021-04-18 DIAGNOSIS — E7800 Pure hypercholesterolemia, unspecified: Secondary | ICD-10-CM | POA: Diagnosis not present

## 2021-04-18 DIAGNOSIS — E559 Vitamin D deficiency, unspecified: Secondary | ICD-10-CM | POA: Diagnosis not present

## 2021-04-18 DIAGNOSIS — E1129 Type 2 diabetes mellitus with other diabetic kidney complication: Secondary | ICD-10-CM | POA: Diagnosis not present

## 2021-04-18 DIAGNOSIS — Z6823 Body mass index (BMI) 23.0-23.9, adult: Secondary | ICD-10-CM | POA: Diagnosis not present

## 2021-04-18 DIAGNOSIS — Z0000 Encounter for general adult medical examination without abnormal findings: Secondary | ICD-10-CM | POA: Diagnosis not present

## 2021-05-01 DIAGNOSIS — L814 Other melanin hyperpigmentation: Secondary | ICD-10-CM | POA: Diagnosis not present

## 2021-05-01 DIAGNOSIS — L821 Other seborrheic keratosis: Secondary | ICD-10-CM | POA: Diagnosis not present

## 2021-05-01 DIAGNOSIS — D2239 Melanocytic nevi of other parts of face: Secondary | ICD-10-CM | POA: Diagnosis not present

## 2021-05-01 DIAGNOSIS — D485 Neoplasm of uncertain behavior of skin: Secondary | ICD-10-CM | POA: Diagnosis not present

## 2021-05-01 DIAGNOSIS — L820 Inflamed seborrheic keratosis: Secondary | ICD-10-CM | POA: Diagnosis not present

## 2021-05-01 DIAGNOSIS — D225 Melanocytic nevi of trunk: Secondary | ICD-10-CM | POA: Diagnosis not present

## 2021-05-02 DIAGNOSIS — D510 Vitamin B12 deficiency anemia due to intrinsic factor deficiency: Secondary | ICD-10-CM | POA: Diagnosis not present

## 2021-05-02 DIAGNOSIS — E559 Vitamin D deficiency, unspecified: Secondary | ICD-10-CM | POA: Diagnosis not present

## 2021-05-09 ENCOUNTER — Encounter: Payer: Self-pay | Admitting: Cardiology

## 2021-05-09 ENCOUNTER — Other Ambulatory Visit: Payer: Self-pay

## 2021-05-09 ENCOUNTER — Ambulatory Visit: Payer: PPO | Admitting: Cardiology

## 2021-05-09 VITALS — BP 130/78 | HR 62 | Ht 65.0 in | Wt 143.0 lb

## 2021-05-09 DIAGNOSIS — I739 Peripheral vascular disease, unspecified: Secondary | ICD-10-CM | POA: Diagnosis not present

## 2021-05-09 DIAGNOSIS — K219 Gastro-esophageal reflux disease without esophagitis: Secondary | ICD-10-CM | POA: Diagnosis not present

## 2021-05-09 DIAGNOSIS — E7800 Pure hypercholesterolemia, unspecified: Secondary | ICD-10-CM

## 2021-05-09 DIAGNOSIS — I471 Supraventricular tachycardia, unspecified: Secondary | ICD-10-CM

## 2021-05-09 NOTE — Addendum Note (Signed)
Addended by: Edwyna Shell I on: 05/09/2021 10:28 AM   Modules accepted: Orders

## 2021-05-09 NOTE — Progress Notes (Signed)
Cardiology Office Note:    Date:  05/09/2021   ID:  Anjali, Manzella 13-Mar-1941, MRN [id number]  PCP:  Angelina Sheriff, MD  Cardiologist:  Jenne Campus, MD    Referring MD: Angelina Sheriff, MD   Chief Complaint  Patient presents with   Follow-up  I am doing well but had 2 episode of palpitations since I seen you last time  History of Present Illness:    Robin Castro is a 81 y.o. female with past medical history significant for supraventricular tachycardia, essential hypertension, diabetes, dyslipidemia, peripheral vascular disease in form of carotic arterial disease up to 59% stenosis. She comes today to my office for follow-up.  Overall she is doing very well.  Denies have any chest pain tightness squeezing pressure burning chest.  Described to have 2 episodes of palpitations happen few months ago lasting for few minutes.  No dizziness no passing out no chest discomfort when she had  Past Medical History:  Diagnosis Date   Chest discomfort    nl myoview 2010   Diabetes mellitus    type 2- x 12 years   Diabetes mellitus ([place]) 05/31/2018   Dysrhythmia    h/o rapid heart beat   GERD (gastroesophageal reflux disease)    History of cystocele 01/09/2020   Hypercholesteremia    Hyperlipidemia 10/21/2010   Left sided sciatica 05/08/2016   Other symptoms involving cardiovascular system 02/28/2009   Qualifier: Diagnosis of  By: Al-Rammal, RVT, RDCS, Missy     Pain in pelvis 01/09/2020   Palpitations    Paroxysmal SVT (supraventricular tachycardia) ([place]) 05/11/2018   Peripheral vascular disease, unspecified ([place]) up to 60% right internal carotid artery 07/05/2019   PONV (postoperative nausea and vomiting)    Postoperative state 04/19/2014   Pulsatile abdominal mass    neg U/S 2010   S/P TKR (total knee replacement) 09/30/2012   Second degree uterine prolapse 01/09/2020   SVD (spontaneous vaginal delivery)    x 2    Past Surgical History:  Procedure Laterality Date    APPENDECTOMY     BILATERAL SALPINGECTOMY  04/19/2014   Procedure: BILATERAL SALPINGECTOMY;  Surgeon: Daria Pastures, MD;  Location: [facility];  Service: Gynecology;;   BLADDER SUSPENSION N/A 04/19/2014   Procedure: TRANSVAGINAL TAPE (TVT) PROCEDURE;  Surgeon: Daria Pastures, MD;  Location: [facility];  Service: Gynecology;  Laterality: N/A;   BREAST BIOPSY Left    BREAST LUMPECTOMY WITH RADIOACTIVE SEED LOCALIZATION Left 06/06/2020   Procedure: LEFT BREAST LUMPECTOMY WITH RADIOACTIVE SEED LOCALIZATION;  Surgeon: Donnie Mesa, MD;  Location: [facility];  Service: General;  Laterality: Left;   COLONOSCOPY     CYSTOCELE REPAIR N/A 04/19/2014   Procedure: ANTERIOR REPAIR (CYSTOCELE);  Surgeon: Daria Pastures, MD;  Location: [facility];  Service: Gynecology;  Laterality: N/A;   CYSTOSCOPY N/A 04/19/2014   Procedure: CYSTOSCOPY;  Surgeon: Daria Pastures, MD;  Location: [facility];  Service: Gynecology;  Laterality: N/A;   TOTAL KNEE ARTHROPLASTY     VAGINAL HYSTERECTOMY N/A 04/19/2014   Procedure: HYSTERECTOMY VAGINAL;  Surgeon: Daria Pastures, MD;  Location: [facility];  Service: Gynecology;  Laterality: N/A;    Current Medications: Current Meds  Medication Sig   aspirin 81 MG tablet Take 81 mg by mouth daily.   benzonatate (TESSALON) 200 MG capsule Take 200 mg by mouth 3 (three) times daily as needed for cough.   Calcium Carbonate-Vitamin D (CALCIUM 600 + D PO) Take  1 tablet by mouth 2 (two) times daily.   cyanocobalamin (,VITAMIN B-12,) 1000 MCG/ML injection Inject 1,000 mcg into the muscle every 30 (thirty) days.   diazepam (VALIUM) 5 MG tablet Take 5 mg by mouth as needed for anxiety or sleep.   diltiazem (CARDIZEM CD) 180 MG 24 hr capsule Take 1 capsule by mouth once daily   ergocalciferol (VITAMIN D2) 50000 UNITS capsule Take 50,000 Units by mouth every 14 (fourteen) days.   fenofibrate (TRICOR) 145 MG tablet Take 145 mg by mouth daily.   furosemide (LASIX) 20 MG tablet  Take 20 mg by mouth daily.   losartan (COZAAR) 25 MG tablet Take 25 mg by mouth daily.   metFORMIN (GLUCOPHAGE) 850 MG tablet Take 850 mg by mouth 2 (two) times daily with a meal.   omeprazole (PRILOSEC) 20 MG capsule Take 20 mg by mouth as needed (Reflux).   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  Tobacco Use   Smoking status: Never   Smokeless tobacco: Never  Vaping Use   Vaping Use: Never used  Substance and Sexual Activity   Alcohol use: No   Drug use: No   Sexual activity: Yes    Birth control/protection: Post-menopausal  Other Topics Concern   Not on file  Social History Narrative   Not on file   Social Determinants of Health   Financial Resource Strain: Not on file  Food Insecurity: Not on file  Transportation Needs: Not on file  Physical Activity: Not on file  Stress: Not on file  Social Connections: Not on file     Family History: The patient's family history includes Diabetes in an other family member; Heart disease in an other family member; Hyperlipidemia in an other family member; Hypertension in an other family member. ROS:   Please see the history of present illness.    All 14 point review of systems negative except as described per history of present illness  EKGs/Labs/Other Studies Reviewed:      Recent Labs: 06/04/2020: BUN 10; Creatinine, Ser 0.65; Potassium 4.6; Sodium 139  Recent Lipid Panel    Component Value Date/Time   CHOL 150 03/22/2019 0811   TRIG 87 03/22/2019 0811   HDL 49 03/22/2019 0811   CHOLHDL 3.1 03/22/2019 0811   LDLCALC 85 03/22/2019 0811    Physical Exam:     VS:  BP 130/78 (BP Location: Right Arm, Patient Position: Sitting, Cuff Size: Normal)    Pulse 62    Ht 5\' 5"  (1.651 m)    Wt 143 lb (64.9 kg)    SpO2 98%    BMI 23.80 kg/m     Wt Readings from Last 3 Encounters:  05/09/21 143 lb (64.9 kg)  11/02/20 141 lb (64 kg)  06/06/20 140 lb 14 oz (63.9 kg)     GEN:  Well nourished, well developed in no acute distress HEENT: Normal NECK: No JVD; No carotid bruits LYMPHATICS: No lymphadenopathy CARDIAC: RRR, no murmurs, no rubs, no gallops RESPIRATORY:  Clear to auscultation without rales, wheezing or rhonchi  ABDOMEN: Soft, non-tender, non-distended  MUSCULOSKELETAL:  No edema; No deformity  SKIN: Warm and dry LOWER EXTREMITIES: no swelling NEUROLOGIC:  Alert and oriented x 3 PSYCHIATRIC:  Normal affect   ASSESSMENT:    1. Paroxysmal SVT (supraventricular tachycardia) ([place])   2. Peripheral vascular disease, unspecified ([place]) up to 60% right internal carotid artery   3. Gastroesophageal reflux disease, unspecified whether esophagitis present   4. Hypercholesteremia    PLAN:    In order of problems listed above:  Supraventricular tachycardia still recurrences.  I wanted her to increase dose of Cardizem from 1 80-2 40 however she is reluctant.  She preferred to simply watch the situation which we will do Peripheral vascular disease carotic ultrasounds reviewed.  Continue antiplatelet therapy. Dyslipidemia she is on Crestor 5.  I do have data from January 12 however only HDL and total cholesterol which is under 7 7 I do not have LDL we will call primary care physician to get copy of full cholesterol profile Gastroesophageal reflux disease asymptomatic She is getting ready to go and visit [place] in the summertime she want to see me before the trip which we will do   Medication Adjustments/Labs and Tests Ordered: Current medicines are reviewed at length with the patient today.  Concerns regarding medicines are outlined above.  No orders  of the defined types were placed in this encounter.  Medication changes: No orders of the defined types were placed in this encounter.   Signed, Park Liter, MD, [facility] 05/09/2021 10:11 AM    [place]

## 2021-05-09 NOTE — Patient Instructions (Signed)
Medication Instructions:  Your physician recommends that you continue on your current medications as directed. Please refer to the Current Medication list given to you today.  *If you need a refill on your cardiac medications before your next appointment, please call your pharmacy*   Lab Work: None If you have labs (blood work) drawn today and your tests are completely normal, you will receive your results only by: [place] (if you have MyChart) OR A paper copy in the mail If you have any lab test that is abnormal or we need to change your treatment, we will call you to review the results.   Testing/Procedures: Your physician has requested that you have an echocardiogram. Echocardiography is a painless test that uses sound waves to create images of your heart. It provides your doctor with information about the size and shape of your heart and how well your hearts chambers and valves are working. This procedure takes approximately one hour. There are no restrictions for this procedure.    Follow-Up: At [facility], you and your health needs are our priority.  As part of our continuing mission to provide you with exceptional heart care, we have created designated Provider Care Teams.  These Care Teams include your primary Cardiologist (physician) and Advanced Practice Providers (APPs -  Physician Assistants and Nurse Practitioners) who all work together to provide you with the care you need, when you need it.  We recommend signing up for the patient portal called "MyChart".  Sign up information is provided on this After Visit Summary.  MyChart is used to connect with patients for Virtual Visits (Telemedicine).  Patients are able to view lab/test results, encounter notes, upcoming appointments, etc.  Non-urgent messages can be sent to your provider as well.   To learn more about what you can do with MyChart, go to [email or web address].    Your next appointment:   4  month(s)  The format for your next appointment:   In Person  Provider:   Jenne Campus, MD    Other Instructions None

## 2021-05-10 DIAGNOSIS — Z1231 Encounter for screening mammogram for malignant neoplasm of breast: Secondary | ICD-10-CM | POA: Diagnosis not present

## 2021-05-10 DIAGNOSIS — Z01419 Encounter for gynecological examination (general) (routine) without abnormal findings: Secondary | ICD-10-CM | POA: Diagnosis not present

## 2021-05-15 ENCOUNTER — Ambulatory Visit (INDEPENDENT_AMBULATORY_CARE_PROVIDER_SITE_OTHER): Payer: PPO

## 2021-05-15 ENCOUNTER — Other Ambulatory Visit: Payer: Self-pay

## 2021-05-15 DIAGNOSIS — I471 Supraventricular tachycardia: Secondary | ICD-10-CM

## 2021-05-15 DIAGNOSIS — E7800 Pure hypercholesterolemia, unspecified: Secondary | ICD-10-CM

## 2021-05-15 DIAGNOSIS — K219 Gastro-esophageal reflux disease without esophagitis: Secondary | ICD-10-CM | POA: Diagnosis not present

## 2021-05-15 DIAGNOSIS — I739 Peripheral vascular disease, unspecified: Secondary | ICD-10-CM | POA: Diagnosis not present

## 2021-05-15 LAB — ECHOCARDIOGRAM COMPLETE
AR max vel: 1.7 cm2
AV Area VTI: 2 cm2
AV Area mean vel: 1.8 cm2
AV Mean grad: 6 mmHg
AV Peak grad: 11.3 mmHg
Ao pk vel: 1.68 m/s
Area-P 1/2: 2.34 cm2
S' Lateral: 2.9 cm

## 2021-06-04 DIAGNOSIS — D510 Vitamin B12 deficiency anemia due to intrinsic factor deficiency: Secondary | ICD-10-CM | POA: Diagnosis not present

## 2021-06-05 DIAGNOSIS — I471 Supraventricular tachycardia: Secondary | ICD-10-CM | POA: Diagnosis not present

## 2021-06-05 DIAGNOSIS — R011 Cardiac murmur, unspecified: Secondary | ICD-10-CM | POA: Diagnosis not present

## 2021-06-05 DIAGNOSIS — E538 Deficiency of other specified B group vitamins: Secondary | ICD-10-CM | POA: Diagnosis not present

## 2021-06-05 DIAGNOSIS — K219 Gastro-esophageal reflux disease without esophagitis: Secondary | ICD-10-CM | POA: Diagnosis not present

## 2021-06-05 DIAGNOSIS — E785 Hyperlipidemia, unspecified: Secondary | ICD-10-CM | POA: Diagnosis not present

## 2021-06-05 DIAGNOSIS — I10 Essential (primary) hypertension: Secondary | ICD-10-CM | POA: Diagnosis not present

## 2021-06-05 DIAGNOSIS — M810 Age-related osteoporosis without current pathological fracture: Secondary | ICD-10-CM | POA: Diagnosis not present

## 2021-06-05 DIAGNOSIS — E559 Vitamin D deficiency, unspecified: Secondary | ICD-10-CM | POA: Diagnosis not present

## 2021-06-05 DIAGNOSIS — E1169 Type 2 diabetes mellitus with other specified complication: Secondary | ICD-10-CM | POA: Diagnosis not present

## 2021-06-10 ENCOUNTER — Other Ambulatory Visit: Payer: Self-pay | Admitting: Cardiology

## 2021-07-03 DIAGNOSIS — D510 Vitamin B12 deficiency anemia due to intrinsic factor deficiency: Secondary | ICD-10-CM | POA: Diagnosis not present

## 2021-08-09 ENCOUNTER — Encounter: Payer: Self-pay | Admitting: Cardiology

## 2021-08-09 ENCOUNTER — Ambulatory Visit: Payer: PPO | Admitting: Cardiology

## 2021-08-09 VITALS — BP 132/70 | HR 64 | Ht 65.0 in | Wt 138.8 lb

## 2021-08-09 DIAGNOSIS — E119 Type 2 diabetes mellitus without complications: Secondary | ICD-10-CM

## 2021-08-09 DIAGNOSIS — I471 Supraventricular tachycardia: Secondary | ICD-10-CM

## 2021-08-09 DIAGNOSIS — E7800 Pure hypercholesterolemia, unspecified: Secondary | ICD-10-CM | POA: Diagnosis not present

## 2021-08-09 DIAGNOSIS — I739 Peripheral vascular disease, unspecified: Secondary | ICD-10-CM

## 2021-08-09 MED ORDER — FUROSEMIDE 20 MG PO TABS: 20.0000 mg | ORAL_TABLET | Freq: Every day | ORAL | 3 refills | Status: DC

## 2021-08-09 MED ORDER — DILTIAZEM HCL ER COATED BEADS 240 MG PO CP24: 240.0000 mg | ORAL_CAPSULE | Freq: Every day | ORAL | 3 refills | Status: DC

## 2021-08-09 MED ORDER — FENOFIBRATE 145 MG PO TABS: 145.0000 mg | ORAL_TABLET | Freq: Every day | ORAL | 3 refills | Status: DC

## 2021-08-09 MED ORDER — LOSARTAN POTASSIUM 25 MG PO TABS: 25.0000 mg | ORAL_TABLET | Freq: Every day | ORAL | 0 refills | Status: DC

## 2021-08-09 MED ORDER — LOSARTAN POTASSIUM 25 MG PO TABS: 25.0000 mg | ORAL_TABLET | Freq: Every day | ORAL | 3 refills | Status: DC

## 2021-08-09 MED ORDER — DILTIAZEM HCL 30 MG PO TABS: ORAL_TABLET | ORAL | 6 refills | Status: DC

## 2021-08-09 MED ORDER — FUROSEMIDE 20 MG PO TABS: 20.0000 mg | ORAL_TABLET | Freq: Every day | ORAL | 0 refills | Status: DC

## 2021-08-09 MED ORDER — ROSUVASTATIN CALCIUM 5 MG PO TABS: 5.0000 mg | ORAL_TABLET | Freq: Every day | ORAL | 2 refills | Status: DC

## 2021-08-09 MED ORDER — DILTIAZEM HCL ER COATED BEADS 240 MG PO CP24: 240.0000 mg | ORAL_CAPSULE | Freq: Every day | ORAL | 0 refills | Status: DC

## 2021-08-09 MED ORDER — ROSUVASTATIN CALCIUM 5 MG PO TABS: 5.0000 mg | ORAL_TABLET | Freq: Every day | ORAL | 3 refills | Status: DC

## 2021-08-09 NOTE — Progress Notes (Signed)
?Cardiology Office Note:   ? ?Date:  08/09/2021  ? ?ID:  Robin Castro, DOB [birth year], MRN [id number] ? ?PCP:  Angelina Sheriff, MD  ?Cardiologist:  Jenne Campus, MD   ? ?Referring MD: Angelina Sheriff, MD  ? ?Chief Complaint  ?Patient presents with  ? Follow-up  ?Having more palpitations now ? ?History of Present Illness:   ? ?Robin Castro is a 81 y.o. female with past medical history significant for supraventricular tachycardia fairly controlled with calcium channel blocker however lately getting worse, essential hypertension, diabetes, dyslipidemia, peripheral vascular disease in form of carotic arterial disease with up to 60% stenosis.  She is getting ready to have 3 weeks trip in [place] she wanted to be seen before that.  She reports having more palpitations right now compared to before.  She is seen her last time she described 3 episodes of palpitations.  She learned already vagal maneuver she knows how she can break the palpitations however she described one bad episode that lasted a long time but states she comes today to me because last time we discussed potential of increasing dose of Cardizem she did not want to do it now she is ready to do it.  Denies have any chest pain tightness squeezing pressure mid chest no TIA/CVA-like symptoms. ? ?Past Medical History:  ?Diagnosis Date  ? Chest discomfort   ? nl myoview 2010  ? Diabetes mellitus   ? type 2- x 12 years  ? Diabetes mellitus ([place]) 05/31/2018  ? Dysrhythmia   ? h/o rapid heart beat  ? GERD (gastroesophageal reflux disease)   ? History of cystocele 01/09/2020  ? Hypercholesteremia   ? Hyperlipidemia 10/21/2010  ? Left sided sciatica 05/08/2016  ? Other symptoms involving cardiovascular system 02/28/2009  ? Qualifier: Diagnosis of  By: Al-Rammal, RVT, RDCS, Missy    ? Pain in pelvis 01/09/2020  ? Palpitations   ? Paroxysmal SVT (supraventricular tachycardia) ([place]) 05/11/2018  ? Peripheral vascular disease, unspecified ([place]) up to 60% right  internal carotid artery 07/05/2019  ? PONV (postoperative nausea and vomiting)   ? Postoperative state 04/19/2014  ? Pulsatile abdominal mass   ? neg U/S 2010  ? S/P TKR (total knee replacement) 09/30/2012  ? Second degree uterine prolapse 01/09/2020  ? SVD (spontaneous vaginal delivery)   ? x 2  ? ? ?Past Surgical History:  ?Procedure Laterality Date  ? APPENDECTOMY    ? BILATERAL SALPINGECTOMY  04/19/2014  ? Procedure: BILATERAL SALPINGECTOMY;  Surgeon: Daria Pastures, MD;  Location: [facility];  Service: Gynecology;;  ? BLADDER SUSPENSION N/A 04/19/2014  ? Procedure: TRANSVAGINAL TAPE (TVT) PROCEDURE;  Surgeon: Daria Pastures, MD;  Location: [facility];  Service: Gynecology;  Laterality: N/A;  ? BREAST BIOPSY Left   ? BREAST LUMPECTOMY WITH RADIOACTIVE SEED LOCALIZATION Left 06/06/2020  ? Procedure: LEFT BREAST LUMPECTOMY WITH RADIOACTIVE SEED LOCALIZATION;  Surgeon: Donnie Mesa, MD;  Location: [facility];  Service: General;  Laterality: Left;  ? COLONOSCOPY    ? CYSTOCELE REPAIR N/A 04/19/2014  ? Procedure: ANTERIOR REPAIR (CYSTOCELE);  Surgeon: Daria Pastures, MD;  Location: [facility];  Service: Gynecology;  Laterality: N/A;  ? CYSTOSCOPY N/A 04/19/2014  ? Procedure: CYSTOSCOPY;  Surgeon: Daria Pastures, MD;  Location: [facility];  Service: Gynecology;  Laterality: N/A;  ? TOTAL KNEE ARTHROPLASTY    ? VAGINAL HYSTERECTOMY N/A 04/19/2014  ? Procedure: HYSTERECTOMY VAGINAL;  Surgeon: Daria Pastures, MD;  Location: [facility];  Service: Gynecology;  Laterality: N/A;  ? ? ?Current Medications: ?Current Meds  ?Medication Sig  ? aspirin 81 MG tablet Take 81 mg by mouth daily.  ? benzonatate (TESSALON) 200 MG capsule Take 200 mg by mouth 3 (three) times daily as needed for cough.  ? Calcium Carbonate-Vitamin D (CALCIUM 600 + D PO) Take 1 tablet by mouth 2 (two) times daily.  ? cyanocobalamin (,VITAMIN B-12,) 1000 MCG/ML injection Inject 1,000 mcg into the muscle every 30 (thirty) days.  ? diazepam (VALIUM) 5  MG tablet Take 5 mg by mouth as needed for anxiety or sleep.  ? diltiazem (CARDIZEM CD) 180 MG 24 hr capsule Take 1 capsule by mouth once daily (Patient taking differently: Take 180 mg by mouth daily. Take 1 capsule by mouth once daily)  ? ergocalciferol (VITAMIN D2) 50000 UNITS capsule Take 50,000 Units by mouth every 14 (fourteen) days.  ? fenofibrate (TRICOR) 145 MG tablet Take 145 mg by mouth daily.  ? furosemide (LASIX) 20 MG tablet Take 20 mg by mouth daily.  ? losartan (COZAAR) 25 MG tablet Take 25 mg by mouth daily.  ? metFORMIN (GLUCOPHAGE) 850 MG tablet Take 850 mg by mouth 2 (two) times daily with a meal.  ? omeprazole (PRILOSEC) 20 MG capsule Take 20 mg by mouth as needed (Reflux).  ? rosuvastatin (CRESTOR) 5 MG tablet Take 1 tablet by mouth once daily (Patient taking differently: Take 5 mg by mouth daily.)  ?  ? ?Allergies:   Lansoprazole, Statins, Empagliflozin, Enalapril, and Niacin and related  ? ?Social History  ? ?Socioeconomic History  ? Marital status: Widowed  ?  Spouse name: Not on file  ? Number of children: 2  ? Years of education: Not on file  ? Highest education level: Not on file  ?Occupational History  ? Occupation: retired IT trainer  ?Tobacco Use  ? Smoking status: Never  ? Smokeless tobacco: Never  ?Vaping Use  ? Vaping Use: Never used  ?Substance and Sexual Activity  ? Alcohol use: No  ? Drug use: No  ? Sexual activity: Yes  ?  Birth control/protection: Post-menopausal  ?Other Topics Concern  ? Not on file  ?Social History Narrative  ? Not on file  ? ?Social Determinants of Health  ? ?Financial Resource Strain: Not on file  ?Food Insecurity: Not on file  ?Transportation Needs: Not on file  ?Physical Activity: Not on file  ?Stress: Not on file  ?Social Connections: Not on file  ?  ? ?Family History: ?The patient's family history includes Diabetes in an other family member; Heart disease in an other family member; Hyperlipidemia in an other family member; Hypertension in an other  family member. ?ROS:   ?Please see the history of present illness.    ?All 14 point review of systems negative except as described per history of present illness ? ?EKGs/Labs/Other Studies Reviewed:   ? ? ? ?Recent Labs: ?No results found for requested labs within last 8760 hours.  ?Recent Lipid Panel ?   ?Component Value Date/Time  ? CHOL 150 03/22/2019 0811  ? TRIG 87 03/22/2019 0811  ? HDL 49 03/22/2019 0811  ? CHOLHDL 3.1 03/22/2019 0811  ? [place] 85 03/22/2019 0811  ? ? ?Physical Exam:   ? ?VS:  BP 132/70 (BP Location: Left Arm, Patient Position: Sitting)   Pulse 64   Ht '5\' 5"'$  (1.651 m)   Wt 138 lb 12.8 oz (63 kg)   SpO2 97%   BMI 23.10 kg/m?    ? ?  Wt Readings from Last 3 Encounters:  ?08/09/21 138 lb 12.8 oz (63 kg)  ?05/09/21 143 lb (64.9 kg)  ?11/02/20 141 lb (64 kg)  ?  ? ?GEN:  Well nourished, well developed in no acute distress ?HEENT: Normal ?NECK: No JVD; No carotid bruits ?LYMPHATICS: No lymphadenopathy ?CARDIAC: RRR, no murmurs, no rubs, no gallops ?RESPIRATORY:  Clear to auscultation without rales, wheezing or rhonchi  ?ABDOMEN: Soft, non-tender, non-distended ?MUSCULOSKELETAL:  No edema; No deformity  ?SKIN: Warm and dry ?LOWER EXTREMITIES: no swelling ?NEUROLOGIC:  Alert and oriented x 3 ?PSYCHIATRIC:  Normal affect  ? ?ASSESSMENT:   ? ?1. Paroxysmal SVT (supraventricular tachycardia) ([facility])   ?2. Peripheral vascular disease, unspecified ([place]) up to 60% right internal carotid artery   ?3. Type 2 diabetes mellitus without complication, without long-term current use of insulin ([place])   ?4. Hypercholesteremia   ? ?PLAN:   ? ?In order of problems listed above: ? ?Supraventricular tachycardia.  We will do EKG if EKG is fine we will increase Cardizem from 1 80-2 40 daily.  I will also give her prescription for Cardizem 30 mg to use on as-needed basis. ?Peripheral vascular disease.  Stable she is on antiplatelet therapy in form of aspirin which I will continue, she is also on statin however she does  have some difficulty tolerating Crestor. ?Dyslipidemia she described to have some joint aches and she thinks it is related to Crestor.  I told her it is fine with me to stop Crestor for 1 week to see if she feels any

## 2021-08-09 NOTE — Addendum Note (Signed)
Addended by: Jacobo Forest D on: 08/09/2021 10:39 AM ? ? Modules accepted: Orders ? ?

## 2021-08-09 NOTE — Patient Instructions (Signed)
Medication Instructions:  ?Your physician has recommended you make the following change in your medication:  ?INCREASE Cardizem CD to '240mg'$  1 tablet every day by mouth ?Cardizem '30mg'$  every 8 hours ONLY AS NEEDED for palpitations  ? ? ?Lab Work: ?None Ordered ?If you have labs (blood work) drawn today and your tests are completely normal, you will receive your results only by: ?MyChart Message (if you have MyChart) OR ?A paper copy in the mail ?If you have any lab test that is abnormal or we need to change your treatment, we will call you to review the results. ? ? ?Testing/Procedures: ?None Ordered ? ? ?Follow-Up: ?At [facility], you and your health needs are our priority.  As part of our continuing mission to provide you with exceptional heart care, we have created designated Provider Care Teams.  These Care Teams include your primary Cardiologist (physician) and Advanced Practice Providers (APPs -  Physician Assistants and Nurse Practitioners) who all work together to provide you with the care you need, when you need it. ? ?We recommend signing up for the patient portal called "MyChart".  Sign up information is provided on this After Visit Summary.  MyChart is used to connect with patients for Virtual Visits (Telemedicine).  Patients are able to view lab/test results, encounter notes, upcoming appointments, etc.  Non-urgent messages can be sent to your provider as well.   ?To learn more about what you can do with MyChart, go to [email or web address].   ? ?Your next appointment:   ?6 month(s) ? ?The format for your next appointment:   ?In Person ? ?Provider:   ?Jenne Campus, MD  ? ? ?Other Instructions ?NA  ?

## 2021-08-12 DIAGNOSIS — E538 Deficiency of other specified B group vitamins: Secondary | ICD-10-CM | POA: Diagnosis not present

## 2021-08-12 NOTE — Addendum Note (Signed)
Addended by: Jerl Santos R on: 08/12/2021 10:37 AM ? ? Modules accepted: Orders ? ?

## 2021-08-15 ENCOUNTER — Encounter (HOSPITAL_COMMUNITY): Payer: Self-pay

## 2021-08-21 ENCOUNTER — Telehealth: Payer: Self-pay | Admitting: Cardiology

## 2021-08-21 NOTE — Telephone Encounter (Signed)
Patient was calling in to say since the increase on diltiazem (CARDIZEM CD) 240 MG 24 hr capsule she been doing fine on the medication. Wanted to let the dr know. Please advise  ? ?

## 2021-09-24 DIAGNOSIS — N200 Calculus of kidney: Secondary | ICD-10-CM | POA: Diagnosis not present

## 2021-09-24 DIAGNOSIS — R399 Unspecified symptoms and signs involving the genitourinary system: Secondary | ICD-10-CM | POA: Diagnosis not present

## 2021-09-24 DIAGNOSIS — N952 Postmenopausal atrophic vaginitis: Secondary | ICD-10-CM | POA: Diagnosis not present

## 2021-09-24 DIAGNOSIS — R319 Hematuria, unspecified: Secondary | ICD-10-CM | POA: Diagnosis not present

## 2021-09-24 DIAGNOSIS — N3000 Acute cystitis without hematuria: Secondary | ICD-10-CM | POA: Diagnosis not present

## 2021-09-24 DIAGNOSIS — N958 Other specified menopausal and perimenopausal disorders: Secondary | ICD-10-CM | POA: Diagnosis not present

## 2021-09-24 DIAGNOSIS — N390 Urinary tract infection, site not specified: Secondary | ICD-10-CM | POA: Diagnosis not present

## 2021-10-15 DIAGNOSIS — D510 Vitamin B12 deficiency anemia due to intrinsic factor deficiency: Secondary | ICD-10-CM | POA: Diagnosis not present

## 2021-10-16 IMAGING — US US BREAST BX W LOC DEV 1ST LESION IMG BX SPEC US GUIDE*L*
1 series · 12 of 12 positions shown · non-contrast
Comparison: Previous exam(s).
COMPARISON: Previous exam(s).

Addendum:
CLINICAL DATA: 79-year-old female with an indeterminate left breast
mass/shadowing.

EXAM:
ULTRASOUND GUIDED LEFT BREAST CORE NEEDLE BIOPSY

[Series 1: us breast bx w loc dev 1st lesion img bx spec us g · 0.07mm/px · 12 of 12 slices shown]
[im 1/12]
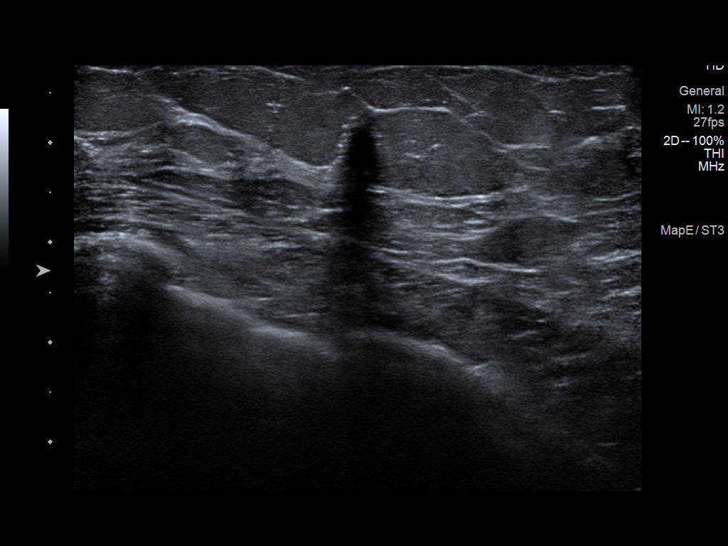
[im 2/12]
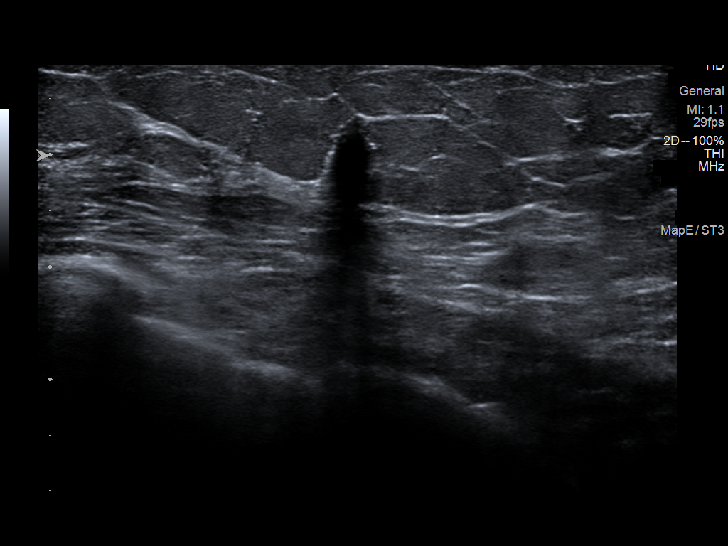
[im 3/12]
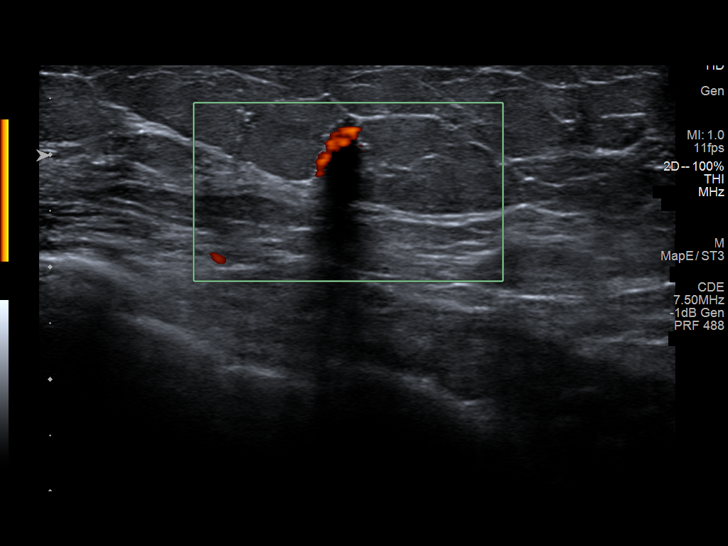
[im 4/12]
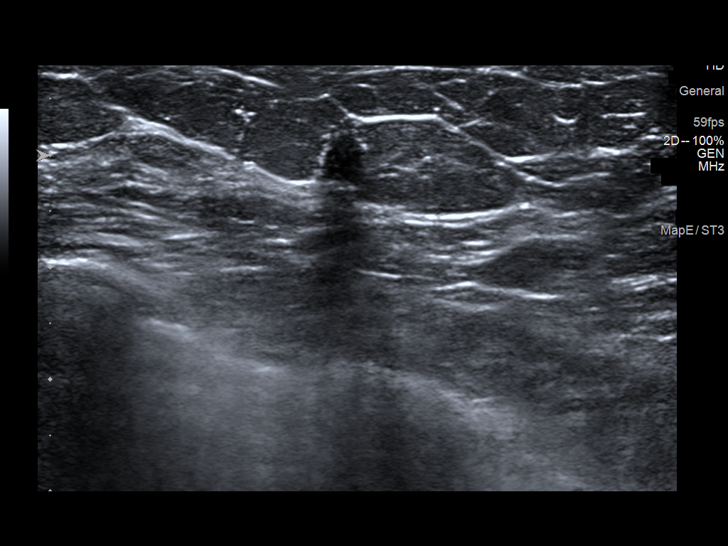
[im 5/12]
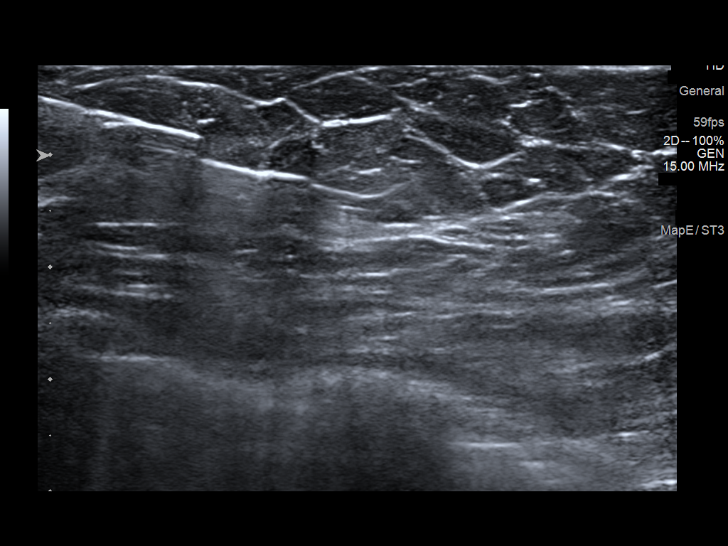
[im 6/12]
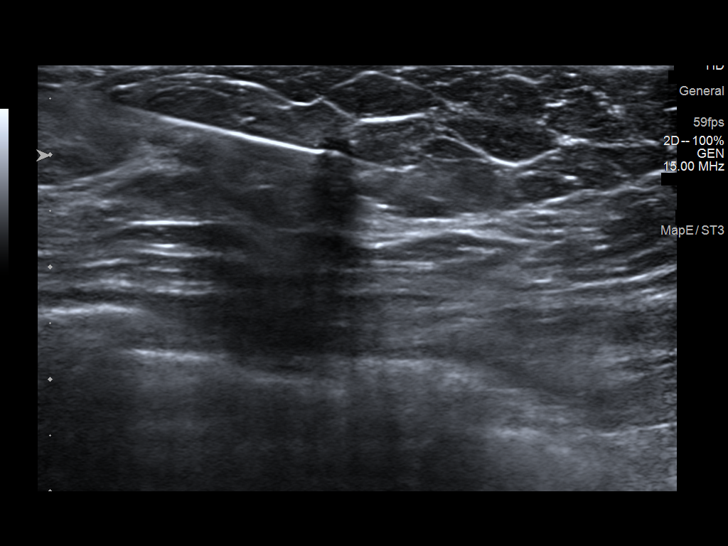
[im 7/12]
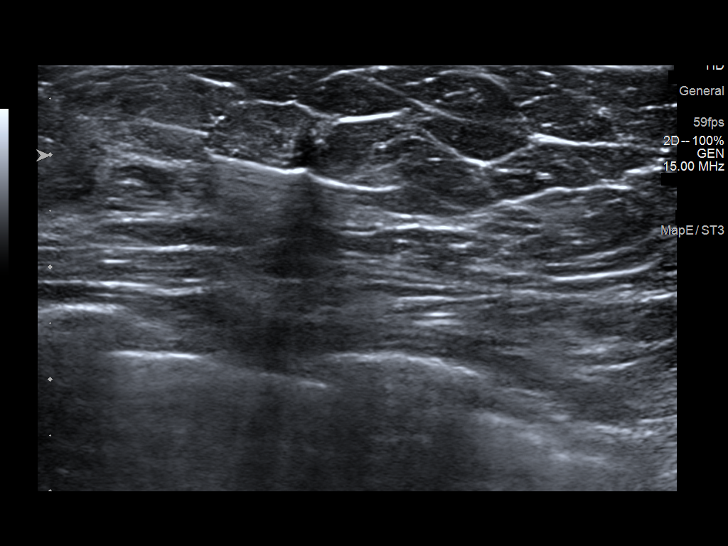
[im 8/12]
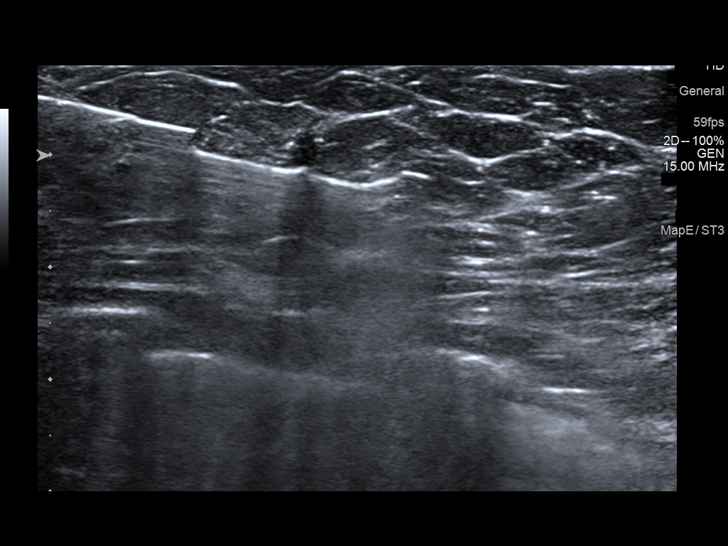
[im 9/12]
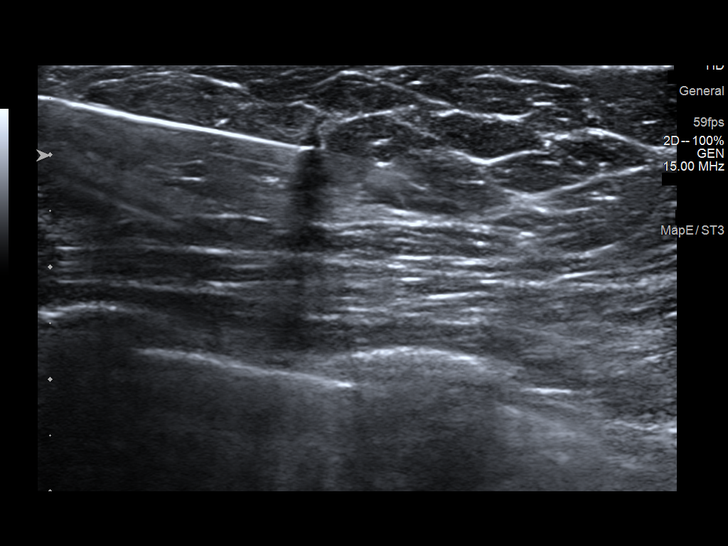
[im 10/12]
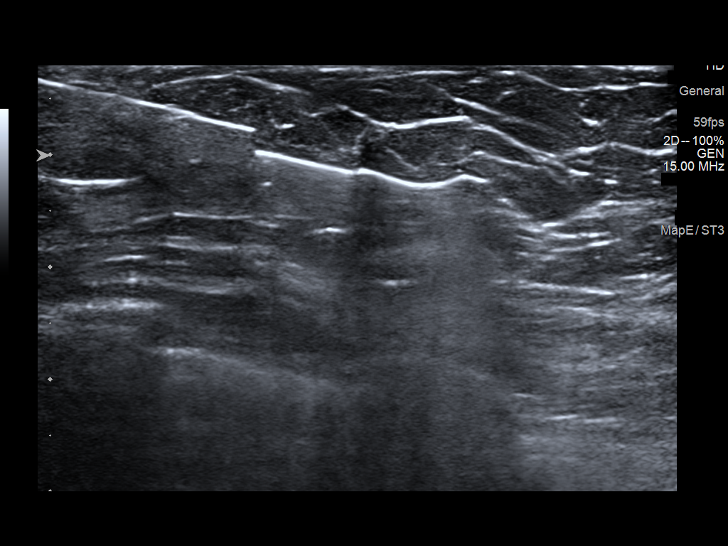
[im 11/12]
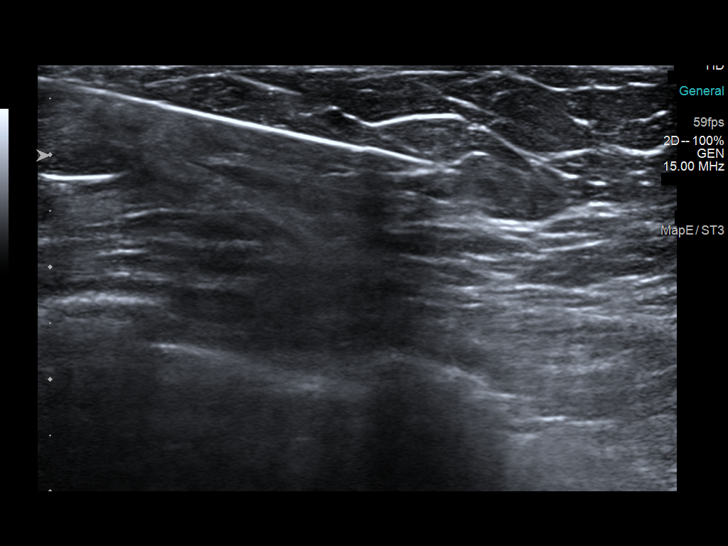
[im 12/12]
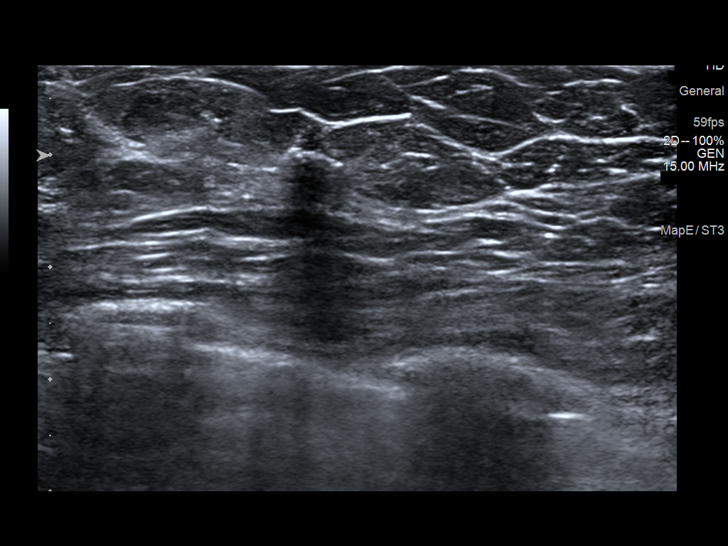

[12 of 12 positions shown; findings below may reference images not displayed]



Lesion quadrant: Upper outer quadrant

Using sterile technique and 1% Lidocaine as local anesthetic, under
direct ultrasound visualization, a 12 gauge Steck device was
used to perform biopsy of a shadowing mass at the 2 o'clock position
7 cm from the nipple using a inferior approach. At the conclusion of
the procedure a coil shaped tissue marker clip was deployed into the
biopsy cavity. Follow up 2 view mammogram was performed and dictated
separately.
IMPRESSION: Ultrasound guided biopsy of the left breast. No apparent
complications.

ADDENDUM:
Pathology revealed BENIGN BREAST TISSUE WITH STROMAL FIBROSIS of the
Left breast, 2 o'clock, 7 cmfn. The differential diagnosis includes
sclerotic fibroadenoma and sclerotic papilloma. Atypia is not
identified. This was found to be concordant by Dr. Yliana Hettinger,
with excision recommended.

Pathology results were discussed with the patient by telephone. The
patient reported doing well after the biopsy with tenderness at the
site. Post biopsy instructions and care were reviewed and questions
were answered. The patient was encouraged to call The [REDACTED] for any additional concerns. My direct phone
number was provided.

Surgical consultation has been arranged with Dr. Savio Locklear at
[REDACTED] on April 26, 2020.

Pathology results reported by Ermesom Dullius, RN on 03/26/2020.



Lesion quadrant: Upper outer quadrant

Using sterile technique and 1% Lidocaine as local anesthetic, under
direct ultrasound visualization, a 12 gauge Steck device was
used to perform biopsy of a shadowing mass at the 2 o'clock position
7 cm from the nipple using a inferior approach. At the conclusion of
the procedure a coil shaped tissue marker clip was deployed into the
biopsy cavity. Follow up 2 view mammogram was performed and dictated
separately.
IMPRESSION: Ultrasound guided biopsy of the left breast. No apparent
complications.

## 2021-10-21 ENCOUNTER — Other Ambulatory Visit: Payer: Self-pay | Admitting: Cardiology

## 2021-11-05 DIAGNOSIS — Z1331 Encounter for screening for depression: Secondary | ICD-10-CM | POA: Diagnosis not present

## 2021-11-05 DIAGNOSIS — Z9181 History of falling: Secondary | ICD-10-CM | POA: Diagnosis not present

## 2021-11-05 DIAGNOSIS — Z79899 Other long term (current) drug therapy: Secondary | ICD-10-CM | POA: Diagnosis not present

## 2021-11-05 DIAGNOSIS — E559 Vitamin D deficiency, unspecified: Secondary | ICD-10-CM | POA: Diagnosis not present

## 2021-11-05 DIAGNOSIS — E1129 Type 2 diabetes mellitus with other diabetic kidney complication: Secondary | ICD-10-CM | POA: Diagnosis not present

## 2021-11-05 DIAGNOSIS — M1990 Unspecified osteoarthritis, unspecified site: Secondary | ICD-10-CM | POA: Diagnosis not present

## 2021-11-05 DIAGNOSIS — Z6823 Body mass index (BMI) 23.0-23.9, adult: Secondary | ICD-10-CM | POA: Diagnosis not present

## 2021-12-16 DIAGNOSIS — D510 Vitamin B12 deficiency anemia due to intrinsic factor deficiency: Secondary | ICD-10-CM | POA: Diagnosis not present

## 2021-12-27 DIAGNOSIS — E119 Type 2 diabetes mellitus without complications: Secondary | ICD-10-CM | POA: Diagnosis not present

## 2021-12-29 IMAGING — MG MM PLC BREAST LOC DEV 1ST LESION INC MAMMO GUIDE*L*
7 series · 7 of 7 positions shown · non-contrast
Comparison: Previous exam(s).

CLINICAL DATA: Patient presents for seed localization prior to LEFT
excisional biopsy. Recent ultrasound-guided core biopsy shows
stromal fibrosis in the 2 o'clock location of the LEFT breast.

EXAM:
MAMMOGRAPHIC GUIDED RADIOACTIVE SEED LOCALIZATION OF THE LEFT BREAST

[L CC (1 of 3)]
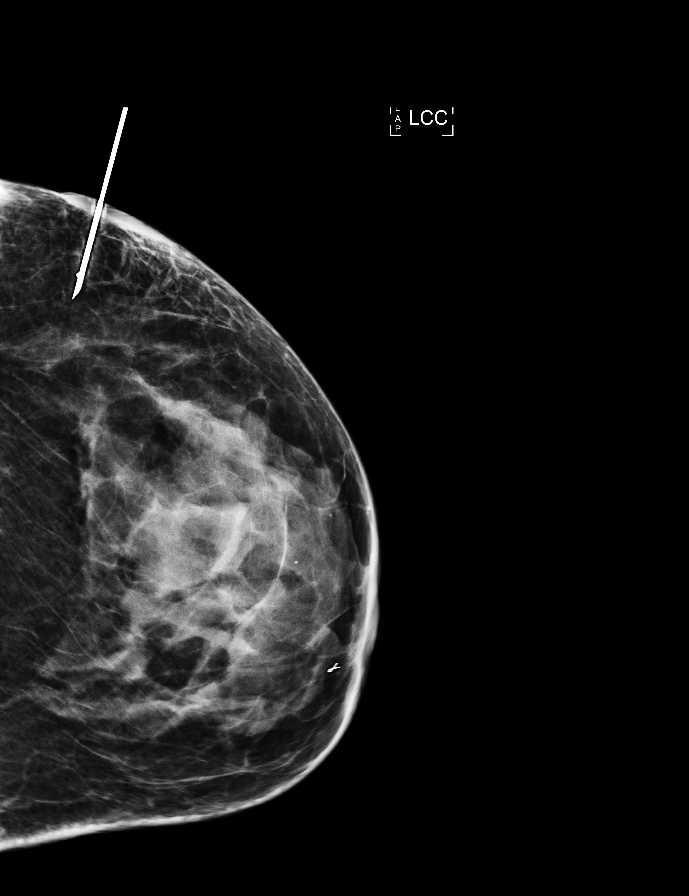

[L CC (2 of 3)]
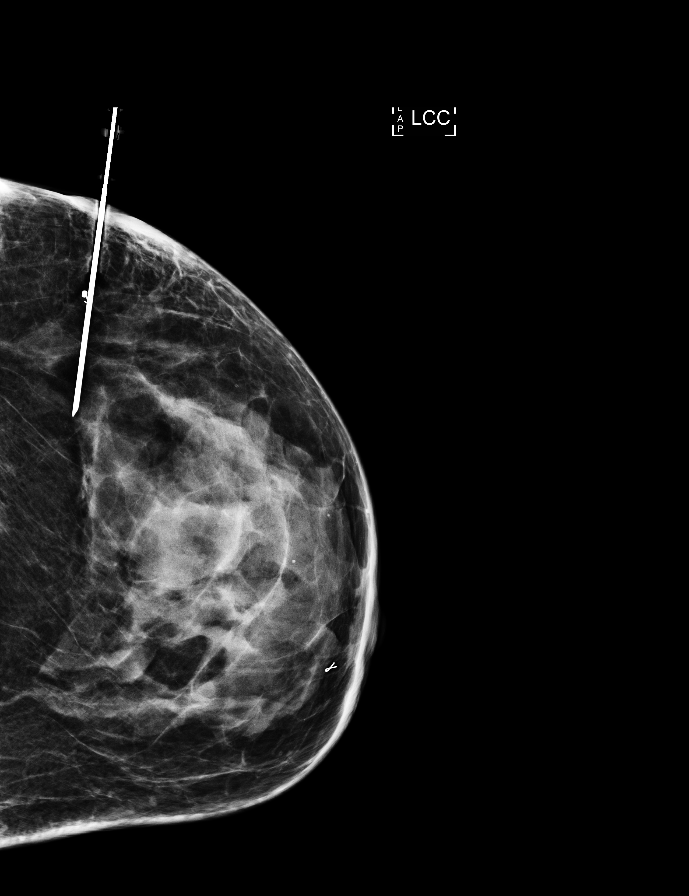

[L LM (1 of 4)]
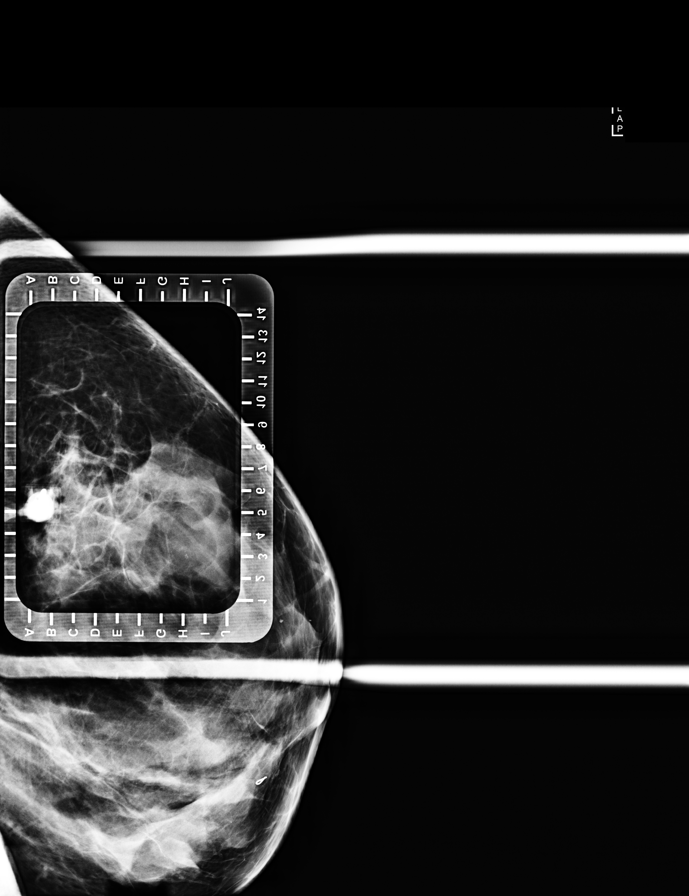

[L LM (2 of 4)]
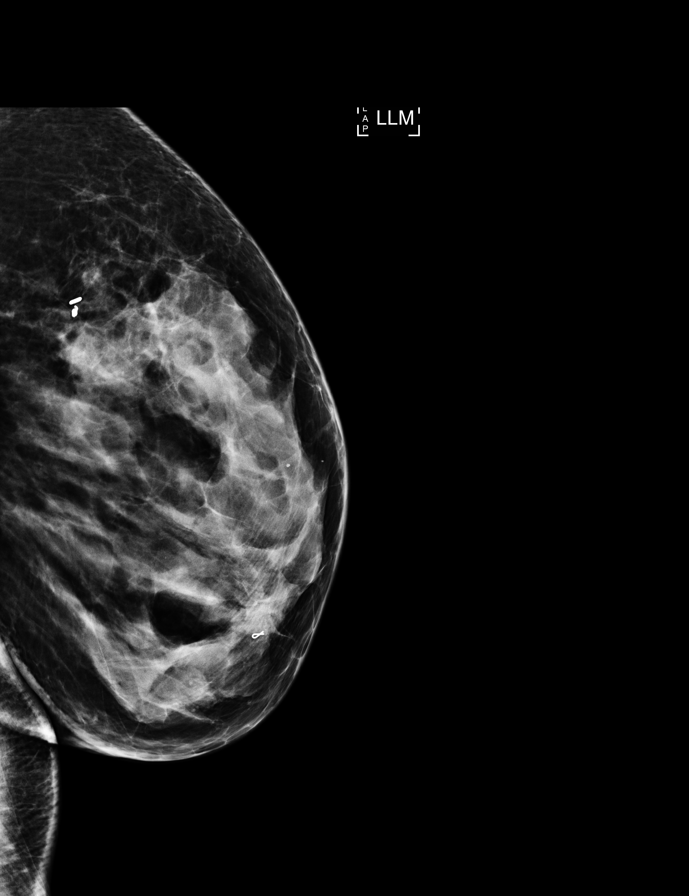

[L LM (3 of 4)]
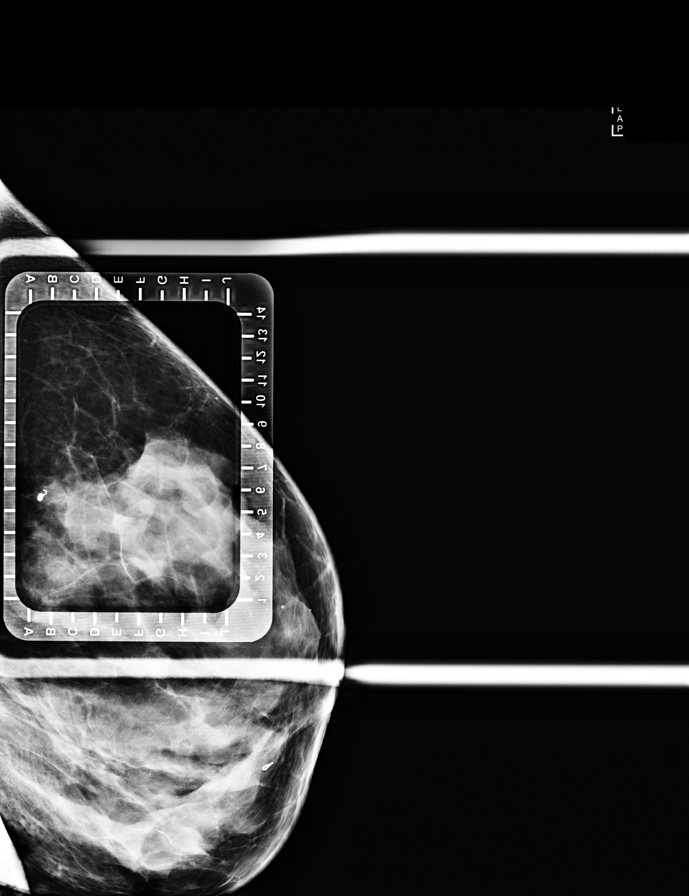

[L CC (3 of 3)]
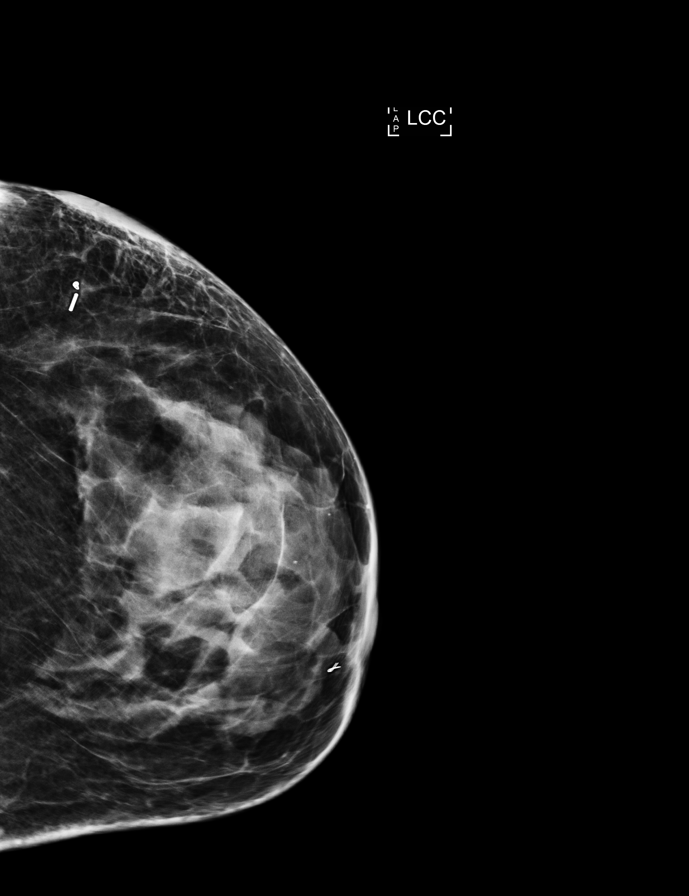

[L LM (4 of 4)]
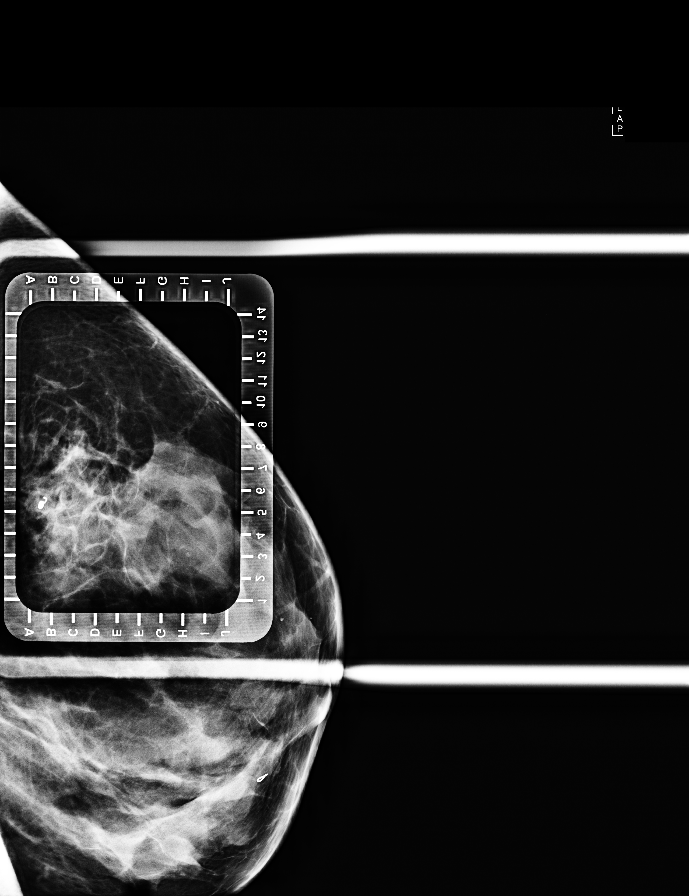

[7 of 7 positions shown; findings below may reference images not displayed]

FINDINGS: Patient presents for radioactive seed localization prior to
excisional biopsy. I met with the patient and we discussed the
procedure of seed localization including benefits and alternatives.
We discussed the high likelihood of a successful procedure. We
discussed the risks of the procedure including infection, bleeding,
tissue injury and further surgery. We discussed the low dose of
radioactivity involved in the procedure. Informed, written consent
was given.

The usual time-out protocol was performed immediately prior to the
procedure.

Using mammographic guidance, sterile technique, 1% lidocaine and an
[id number] radioactive seed, the coil shaped clip in the LATERAL portion
of the LEFT breast was localized using a LATERAL to MEDIAL approach.
The follow-up mammogram images confirm the seed in the expected
location and were marked for Dr. Myeong Ja.

Follow-up survey of the patient confirms presence of the radioactive
seed.

Order number of [id number] seed:  [id number].

Total activity:  0.257 millicuries reference Date: 05/16/2020

The patient tolerated the procedure well and was released from the
[REDACTED]. She was given instructions regarding seed removal.
IMPRESSION: Radioactive seed localization left breast. No apparent
complications.

## 2021-12-30 IMAGING — MG MM BREAST SURGICAL SPECIMEN
1 series · 2 of 2 positions shown · non-contrast
Comparison: Previous exam(s).

CLINICAL DATA: Evaluate surgical specimen following LEFT excisional
breast biopsy.

EXAM:
SPECIMEN RADIOGRAPH OF THE LEFT BREAST

[Series 2: L · left · 0.07mm/px · 2 of 2 slices shown]
[im 1/2]
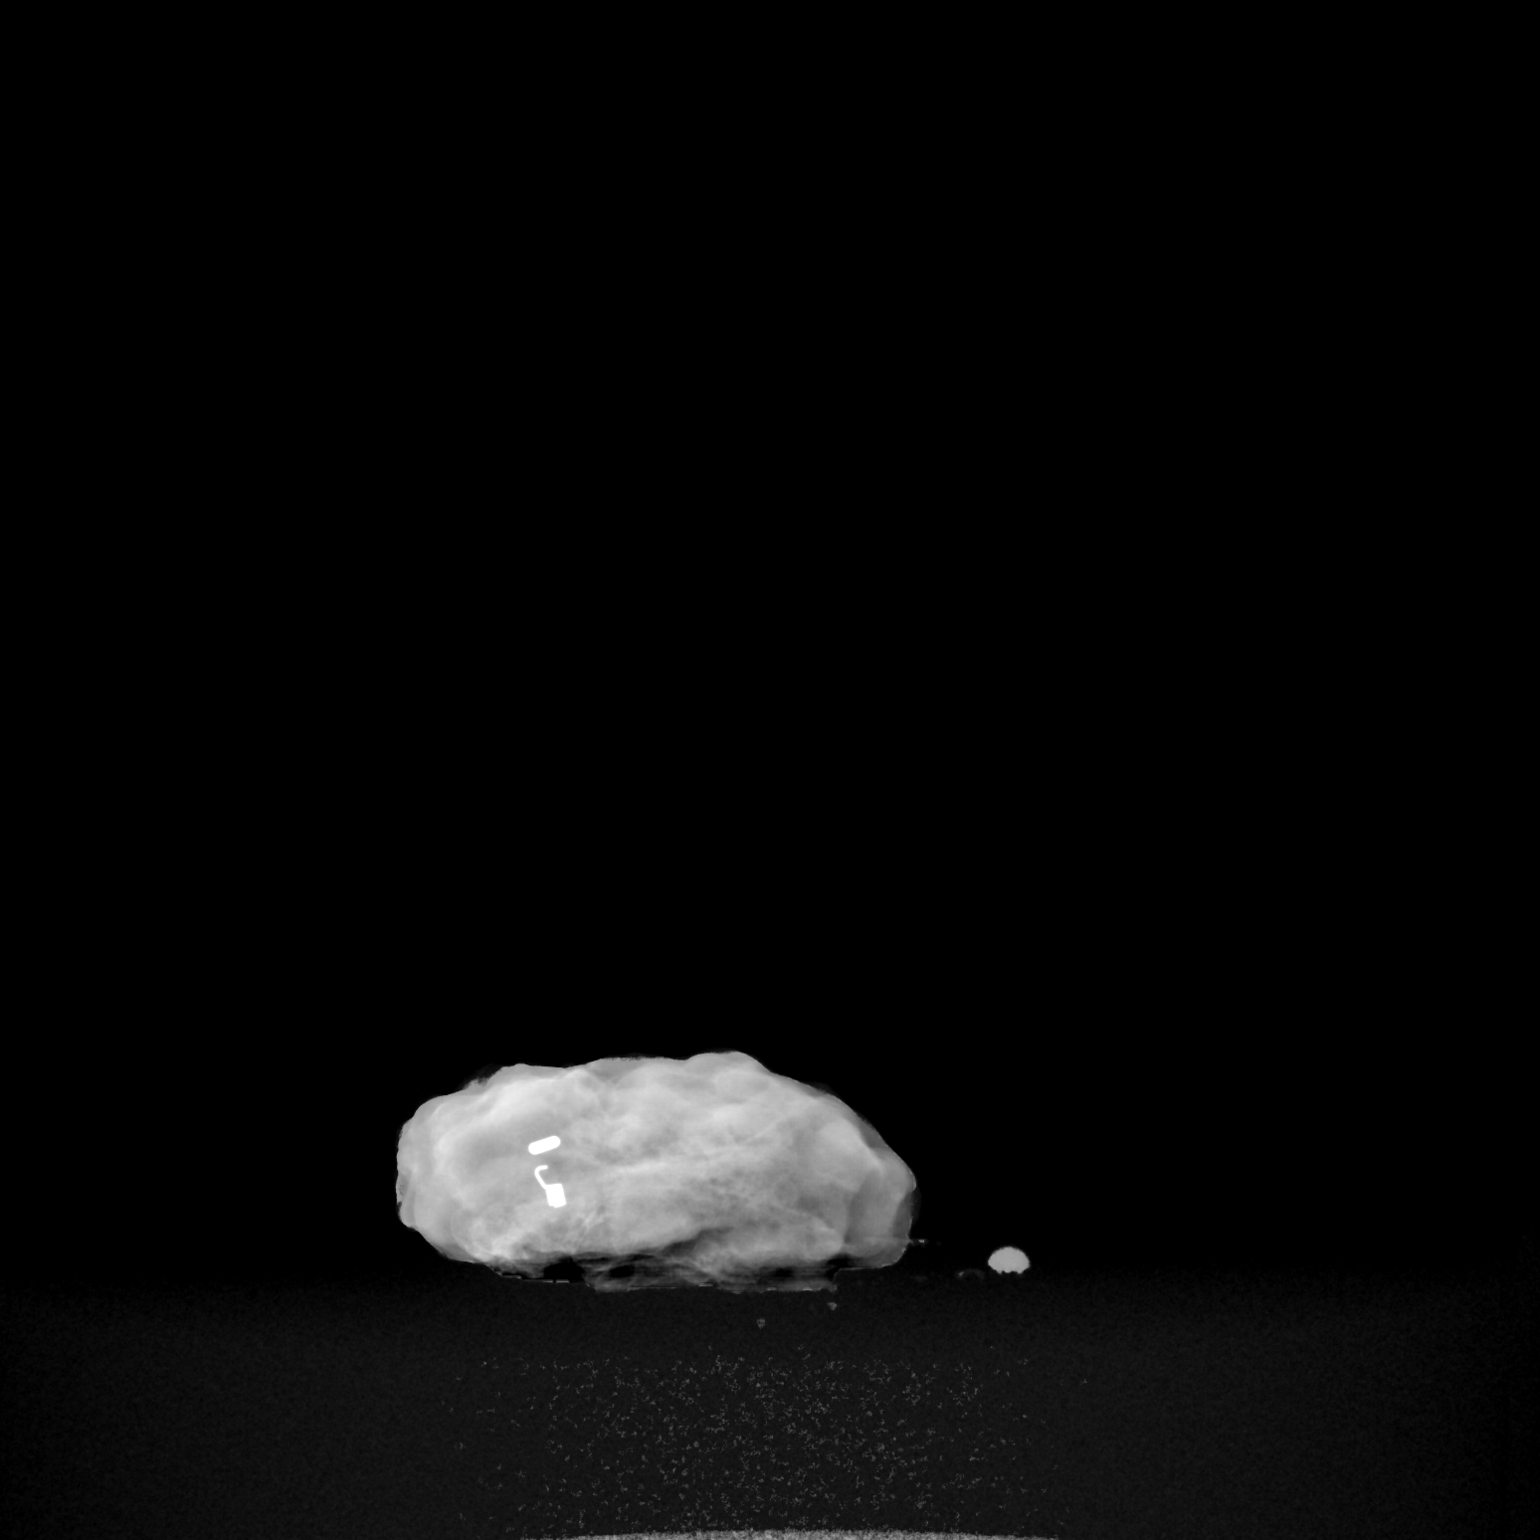
[im 2/2]
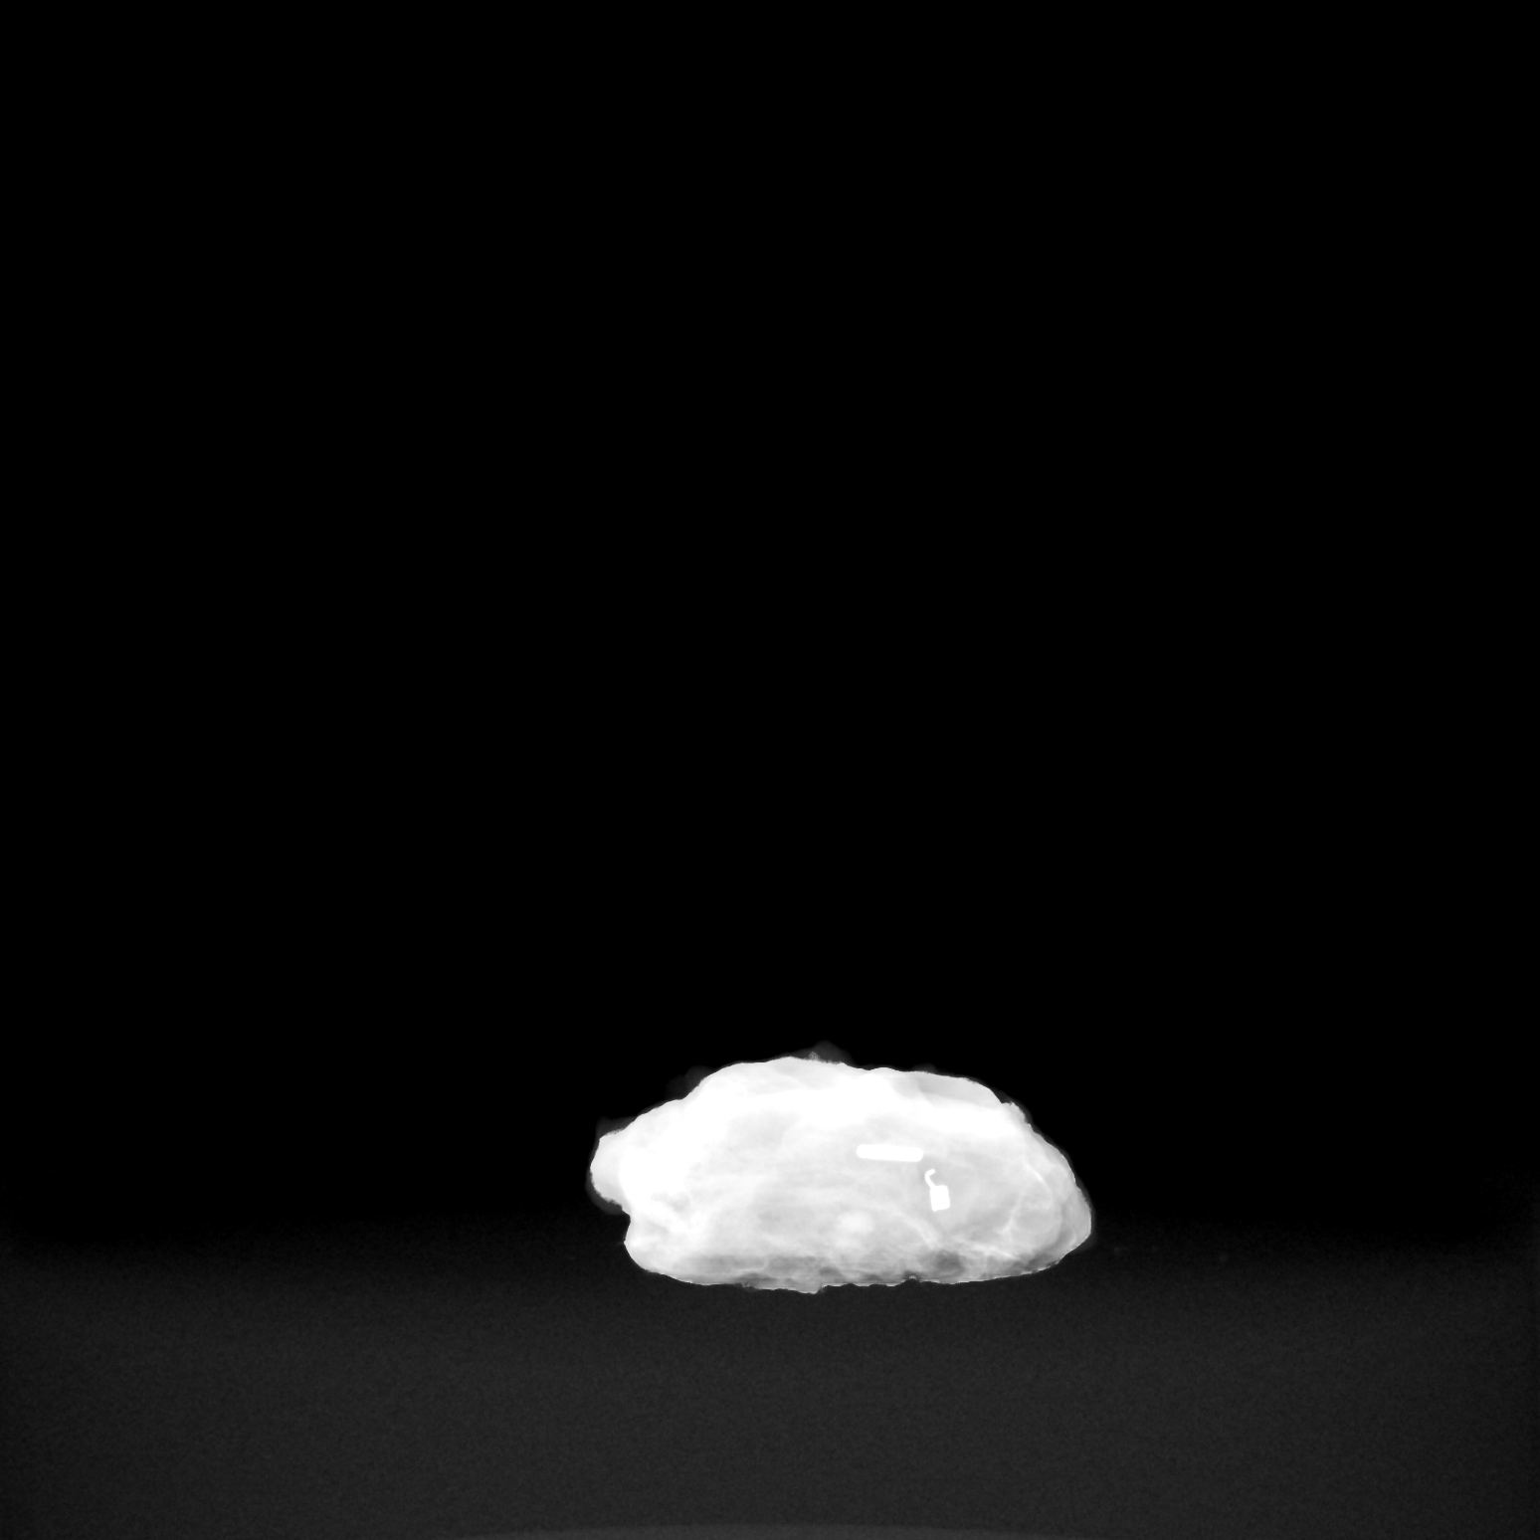

[2 of 2 positions shown; findings below may reference images not displayed]

FINDINGS: Status post excision of the LEFT breast. The radioactive seed and
COIL biopsy marker clip are present and completely intact.
IMPRESSION: Specimen radiograph of the LEFT breast.

## 2022-01-06 DIAGNOSIS — Z23 Encounter for immunization: Secondary | ICD-10-CM | POA: Diagnosis not present

## 2022-02-04 DIAGNOSIS — M48061 Spinal stenosis, lumbar region without neurogenic claudication: Secondary | ICD-10-CM | POA: Diagnosis not present

## 2022-02-04 DIAGNOSIS — M5136 Other intervertebral disc degeneration, lumbar region: Secondary | ICD-10-CM | POA: Diagnosis not present

## 2022-02-04 DIAGNOSIS — M25551 Pain in right hip: Secondary | ICD-10-CM | POA: Diagnosis not present

## 2022-02-04 DIAGNOSIS — M1611 Unilateral primary osteoarthritis, right hip: Secondary | ICD-10-CM | POA: Diagnosis not present

## 2022-02-04 DIAGNOSIS — M545 Low back pain, unspecified: Secondary | ICD-10-CM | POA: Diagnosis not present

## 2022-02-04 DIAGNOSIS — M549 Dorsalgia, unspecified: Secondary | ICD-10-CM | POA: Diagnosis not present

## 2022-02-11 DIAGNOSIS — E538 Deficiency of other specified B group vitamins: Secondary | ICD-10-CM | POA: Diagnosis not present

## 2022-02-17 DIAGNOSIS — K219 Gastro-esophageal reflux disease without esophagitis: Secondary | ICD-10-CM | POA: Diagnosis not present

## 2022-02-17 DIAGNOSIS — K635 Polyp of colon: Secondary | ICD-10-CM | POA: Diagnosis not present

## 2022-02-17 DIAGNOSIS — K5790 Diverticulosis of intestine, part unspecified, without perforation or abscess without bleeding: Secondary | ICD-10-CM | POA: Diagnosis not present

## 2022-02-19 ENCOUNTER — Encounter: Payer: Self-pay | Admitting: Cardiology

## 2022-02-19 ENCOUNTER — Ambulatory Visit: Payer: PPO | Attending: Cardiology | Admitting: Cardiology

## 2022-02-19 VITALS — BP 108/62 | HR 84 | Ht 65.0 in | Wt 141.6 lb

## 2022-02-19 DIAGNOSIS — E782 Mixed hyperlipidemia: Secondary | ICD-10-CM | POA: Diagnosis not present

## 2022-02-19 DIAGNOSIS — I739 Peripheral vascular disease, unspecified: Secondary | ICD-10-CM

## 2022-02-19 DIAGNOSIS — I471 Supraventricular tachycardia, unspecified: Secondary | ICD-10-CM

## 2022-02-19 DIAGNOSIS — K219 Gastro-esophageal reflux disease without esophagitis: Secondary | ICD-10-CM | POA: Diagnosis not present

## 2022-02-19 MED ORDER — CRESTOR 5 MG PO TABS: 2.5000 mg | ORAL_TABLET | ORAL | 3 refills | Status: DC

## 2022-02-19 NOTE — Addendum Note (Signed)
Addended by: Jerl Santos R on: 02/19/2022 01:21 PM   Modules accepted: Orders

## 2022-02-19 NOTE — Addendum Note (Signed)
Addended by: Jerl Santos R on: 02/19/2022 01:44 PM   Modules accepted: Orders

## 2022-02-19 NOTE — Patient Instructions (Signed)
Medication Instructions:  Your physician has recommended you make the following change in your medication:  Start taking Crestor 2.5 mg three times weekly  *If you need a refill on your cardiac medications before your next appointment, please call your pharmacy*   Lab Work: Your physician recommends that you return for lab work in: Architectural technologist for fasting Lipid Profile Come back in 6 weeks to check another fasting Lipid Profile Lab opens at [place] an appointment. Best time to come is between 8am and 12noon and between 1:30 and 4:30. If you have been asked to fast for your blood work please have nothing to eat or drink after midnight. You may have water.   If you have labs (blood work) drawn today and your tests are completely normal, you will receive your results only by: [place] (if you have MyChart) OR A paper copy in the mail If you have any lab test that is abnormal or we need to change your treatment, we will call you to review the results.   Testing/Procedures: NONE   Follow-Up: At [facility], you and your health needs are our priority.  As part of our continuing mission to provide you with exceptional heart care, we have created designated Provider Care Teams.  These Care Teams include your primary Cardiologist (physician) and Advanced Practice Providers (APPs -  Physician Assistants and Nurse Practitioners) who all work together to provide you with the care you need, when you need it.  We recommend signing up for the patient portal called "MyChart".  Sign up information is provided on this After Visit Summary.  MyChart is used to connect with patients for Virtual Visits (Telemedicine).  Patients are able to view lab/test results, encounter notes, upcoming appointments, etc.  Non-urgent messages can be sent to your provider as well.   To learn more about what you can do with MyChart, go to [email or web address].    Your next appointment:   6  month(s)  The format for your next appointment:   In Person  Provider:   Jenne Campus, MD or Shirlee More, MD    Other Instructions   Important Information About Sugar

## 2022-02-19 NOTE — Progress Notes (Signed)
Cardiology Office Note:    Date:  02/19/2022   ID:  Robin Castro, Robin Castro 04/29/1940, MRN [id number]  PCP:  Robin Sheriff, MD  Cardiologist:  Robin Campus, MD    Referring MD: Robin Sheriff, MD   Chief Complaint  Patient presents with   Follow-up    History of Present Illness:    Robin Castro is a 81 y.o. female with past medical history significant for supraventricular tachycardia which is fairly controlled with calcium channel blocker, lately had to increase the dose of it now thanks stable, essential hypertension, diabetes, dyslipidemia, peripheral vascular disease with up to 60% stenosis. Coming today Robin Castro for follow-up doing very well.  Recently she finished a trip in [place] enjoyed tremendously and had a good time.  Denies have any palpitations no chest pain tightness squeezing pressure burning chest no TIA/CVA-like symptoms.  Past Medical History:  Diagnosis Date   Chest discomfort    nl myoview 2010   Diabetes mellitus    type 2- x 12 years   Diabetes mellitus ([place]) 05/31/2018   Dysrhythmia    h/o rapid heart beat   GERD (gastroesophageal reflux disease)    History of cystocele 01/09/2020   Hypercholesteremia    Hyperlipidemia 10/21/2010   Left sided sciatica 05/08/2016   Other symptoms involving cardiovascular system 02/28/2009   Qualifier: Diagnosis of  By: Al-Rammal, RVT, RDCS, Missy     Pain in pelvis 01/09/2020   Palpitations    Paroxysmal SVT (supraventricular tachycardia) 05/11/2018   Peripheral vascular disease, unspecified ([place]) up to 60% right internal carotid artery 07/05/2019   PONV (postoperative nausea and vomiting)    Postoperative state 04/19/2014   Pulsatile abdominal mass    neg U/S 2010   S/P TKR (total knee replacement) 09/30/2012   Second degree uterine prolapse 01/09/2020   SVD (spontaneous vaginal delivery)    x 2    Past Surgical History:  Procedure Laterality Date   APPENDECTOMY     BILATERAL SALPINGECTOMY  04/19/2014    Procedure: BILATERAL SALPINGECTOMY;  Surgeon: Daria Pastures, MD;  Location: [facility];  Service: Gynecology;;   BLADDER SUSPENSION N/A 04/19/2014   Procedure: TRANSVAGINAL TAPE (TVT) PROCEDURE;  Surgeon: Daria Pastures, MD;  Location: [facility];  Service: Gynecology;  Laterality: N/A;   BREAST BIOPSY Left    BREAST LUMPECTOMY WITH RADIOACTIVE SEED LOCALIZATION Left 06/06/2020   Procedure: LEFT BREAST LUMPECTOMY WITH RADIOACTIVE SEED LOCALIZATION;  Surgeon: Donnie Mesa, MD;  Location: [facility];  Service: General;  Laterality: Left;   COLONOSCOPY     CYSTOCELE REPAIR N/A 04/19/2014   Procedure: ANTERIOR REPAIR (CYSTOCELE);  Surgeon: Daria Pastures, MD;  Location: [facility];  Service: Gynecology;  Laterality: N/A;   CYSTOSCOPY N/A 04/19/2014   Procedure: CYSTOSCOPY;  Surgeon: Daria Pastures, MD;  Location: [facility];  Service: Gynecology;  Laterality: N/A;   TOTAL KNEE ARTHROPLASTY     VAGINAL HYSTERECTOMY N/A 04/19/2014   Procedure: HYSTERECTOMY VAGINAL;  Surgeon: Daria Pastures, MD;  Location: [facility];  Service: Gynecology;  Laterality: N/A;    Current Medications: Current Meds  Medication Sig   aspirin 81 MG tablet Take 81 mg by mouth daily.   benzonatate (TESSALON) 200 MG capsule Take 200 mg by mouth 3 (three) times daily as needed for cough.   Calcium Carbonate-Vitamin D (CALCIUM 600 + D PO) Take 1 tablet by mouth 2 (two) times daily.   Coenzyme Q10 (CO Q 10 PO) Take  100 mg by mouth daily.   cyanocobalamin (,VITAMIN B-12,) 1000 MCG/ML injection Inject 1,000 mcg into the muscle every 30 (thirty) days.   diazepam (VALIUM) 5 MG tablet Take 5 mg by mouth as needed for anxiety or sleep.   diltiazem (CARDIZEM CD) 240 MG 24 hr capsule Take 1 capsule (240 mg total) by mouth daily.   diltiazem (CARDIZEM) 30 MG tablet Take 1 tablet every 8 hours ONLY AS NEEDED for palpitations   ergocalciferol (VITAMIN D2) 50000 UNITS capsule Take 50,000 Units by mouth every 14 (fourteen)  days.   fenofibrate (TRICOR) 145 MG tablet Take 1 tablet (145 mg total) by mouth daily.   furosemide (LASIX) 20 MG tablet Take 1 tablet (20 mg total) by mouth daily. (Patient taking differently: Take 20 mg by mouth as needed for fluid.)   losartan (COZAAR) 25 MG tablet Take 1 tablet (25 mg total) by mouth daily.   metFORMIN (GLUCOPHAGE) 850 MG tablet Take 850 mg by mouth 2 (two) times daily with a meal.   omeprazole (PRILOSEC) 20 MG capsule Take 20 mg by mouth as needed (Reflux).   [DISCONTINUED] diltiazem (CARDIZEM CD) 240 MG 24 hr capsule Take 1 capsule (240 mg total) by mouth daily.   [DISCONTINUED] diltiazem (CARDIZEM CD) 240 MG 24 hr capsule Take 1 capsule by mouth once daily   [DISCONTINUED] diltiazem (CARDIZEM) 30 MG tablet Take 1 every 8 hours ONLY AS NEEDED for Palpitations (Patient taking differently: Take 30 mg by mouth every 8 (eight) hours as needed (palpitations). Take 1 every 8 hours ONLY AS NEEDED for Palpitations)   [DISCONTINUED] fenofibrate (TRICOR) 145 MG tablet Take 1 tablet (145 mg total) by mouth daily.   [DISCONTINUED] furosemide (LASIX) 20 MG tablet Take 1 tablet (20 mg total) by mouth daily.   [DISCONTINUED] losartan (COZAAR) 25 MG tablet Take 1 tablet (25 mg total) by mouth daily.   [DISCONTINUED] rosuvastatin (CRESTOR) 5 MG tablet Take 1 tablet (5 mg total) by mouth daily.     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  Tobacco Use   Smoking status: Never   Smokeless tobacco: Never  Vaping Use   Vaping Use: Never used  Substance and Sexual Activity   Alcohol use: No   Drug use: No   Sexual activity: Yes    Birth control/protection: Post-menopausal  Other Topics  Concern   Not on file  Social History Narrative   Not on file   Social Determinants of Health   Financial Resource Strain: Not on file  Food Insecurity: Not on file  Transportation Needs: Not on file  Physical Activity: Not on file  Stress: Not on file  Social Connections: Not on file     Family History: The patient's family history includes Diabetes in an other family member; Heart disease in an other family member; Hyperlipidemia in an other family member; Hypertension in an other family member. ROS:   Please see the history of present illness.    All 14 point review of systems negative except as described per history of present illness  EKGs/Labs/Other Studies Reviewed:      Recent Labs: No results found for requested labs within last 365  days.  Recent Lipid Panel    Component Value Date/Time   CHOL 150 03/22/2019 0811   TRIG 87 03/22/2019 0811   HDL 49 03/22/2019 0811   CHOLHDL 3.1 03/22/2019 0811   LDLCALC 85 03/22/2019 0811    Physical Exam:    VS:  BP 108/62 (BP Location: Left Arm, Patient Position: Sitting)   Pulse 84   Ht '5\' 5"'$  (1.651 m)   Wt 141 lb 9.6 oz (64.2 kg)   SpO2 98%   BMI 23.56 kg/m     Wt Readings from Last 3 Encounters:  02/19/22 141 lb 9.6 oz (64.2 kg)  08/09/21 138 lb 12.8 oz (63 kg)  05/09/21 143 lb (64.9 kg)     GEN:  Well nourished, well developed in no acute distress HEENT: Normal NECK: No JVD; No carotid bruits LYMPHATICS: No lymphadenopathy CARDIAC: RRR, no murmurs, no rubs, no gallops RESPIRATORY:  Clear to auscultation without rales, wheezing or rhonchi  ABDOMEN: Soft, non-tender, non-distended MUSCULOSKELETAL:  No edema; No deformity  SKIN: Warm and dry LOWER EXTREMITIES: no swelling NEUROLOGIC:  Alert and oriented x 3 PSYCHIATRIC:  Normal affect   ASSESSMENT:    1. Paroxysmal SVT (supraventricular tachycardia)   2. Peripheral vascular disease, unspecified ([place]) up to 60% right internal carotid artery   3. Mixed  hyperlipidemia   4. Gastroesophageal reflux disease, unspecified whether esophagitis present    PLAN:    In order of problems listed above:  Peripheral vascular disease stable from that point review continue antiplatelets therapy.  We have difficulty putting her on statin she try Crestor because started having muscle aches.  Stop it and start taking coenzyme Q10.  I told her coenzyme Q10 does not do much for her cholesterol but may allow her to tolerate statin, I will start her on Crestor 2.53 times a week with intention to increase to daily if she can tolerate if not we will try different class of medications Supraventricular tachycardia: Controlled with medications for now continue Gastroesophageal reflux disease stable. Dyslipidemia I did review K PN which show me her data from January of this year with total cholesterol 177 HDL 58.  She requested to have cholesterol checked before starting Crestor   Medication Adjustments/Labs and Tests Ordered: Current medicines are reviewed at length with the patient today.  Concerns regarding medicines are outlined above.  No orders of the defined types were placed in this encounter.  Medication changes: No orders of the defined types were placed in this encounter.   Signed, Park Liter, MD, [facility] 02/19/2022 1:12 PM    [facility]

## 2022-02-20 DIAGNOSIS — E782 Mixed hyperlipidemia: Secondary | ICD-10-CM | POA: Diagnosis not present

## 2022-02-20 DIAGNOSIS — I739 Peripheral vascular disease, unspecified: Secondary | ICD-10-CM | POA: Diagnosis not present

## 2022-02-20 DIAGNOSIS — K219 Gastro-esophageal reflux disease without esophagitis: Secondary | ICD-10-CM | POA: Diagnosis not present

## 2022-02-20 DIAGNOSIS — I471 Supraventricular tachycardia, unspecified: Secondary | ICD-10-CM | POA: Diagnosis not present

## 2022-02-21 LAB — LIPID PANEL
Chol/HDL Ratio: 4.6 ratio — ABNORMAL HIGH (ref 0.0–4.4)
Cholesterol, Total: 226 mg/dL — ABNORMAL HIGH (ref 100–199)
HDL: 49 mg/dL (ref >39)
LDL Chol Calc (NIH): 157 mg/dL — ABNORMAL HIGH (ref 0–99)
Triglycerides: 110 mg/dL (ref 0–149)
VLDL Cholesterol Cal: 20 mg/dL (ref 5–40)

## 2022-02-26 ENCOUNTER — Telehealth: Payer: Self-pay

## 2022-02-26 DIAGNOSIS — E7800 Pure hypercholesterolemia, unspecified: Secondary | ICD-10-CM

## 2022-02-26 MED ORDER — CRESTOR 5 MG PO TABS: 5.0000 mg | ORAL_TABLET | Freq: Every day | ORAL | 3 refills | Status: DC

## 2022-02-26 NOTE — Telephone Encounter (Signed)
Results reviewed with pt as per Dr. Krasowski's note.  Pt verbalized understanding and had no additional questions. Routed to PCP  

## 2022-03-05 ENCOUNTER — Other Ambulatory Visit: Payer: Self-pay

## 2022-03-05 ENCOUNTER — Telehealth: Payer: Self-pay | Admitting: Cardiology

## 2022-03-05 MED ORDER — ROSUVASTATIN CALCIUM 10 MG PO TABS: 10.0000 mg | ORAL_TABLET | Freq: Every day | ORAL | 3 refills | Status: DC

## 2022-03-05 NOTE — Telephone Encounter (Signed)
Pharmacy notified to d/c Crestor '5mg'$ , resent '10mg'$  for the name brand only per Dr. Agustin Cree lab noted on 02/26/2022. Prior Auth initiated and the confirmation number is [id number]

## 2022-03-05 NOTE — Telephone Encounter (Signed)
Pt c/o medication issue:  1. Name of Medication: CRESTOR 5 MG tablet   2. How are you currently taking this medication (dosage and times per day)?   3. Are you having a reaction (difficulty breathing--STAT)?   4. What is your medication issue? Patient called stating we need to get in touch with her insurance for a prior auth since it's a name brand medication.

## 2022-03-05 NOTE — Telephone Encounter (Signed)
Pharmacy aware to cancel Crestor '5mg'$ , Resent Crestor '10mg'$  daily per Dr. Agustin Cree lab notes on 02/26/2022. Prior Auth initiated for the name brand. Confirmation number #[id number]

## 2022-03-06 NOTE — Telephone Encounter (Signed)
PA approved  from 03/05/2022 to 04/07/2023 Confirmation letter processed to scan, LM to return my call.

## 2022-03-06 NOTE — Telephone Encounter (Signed)
Pt returned call to [facility].   Best contact number is [phone]

## 2022-03-07 DIAGNOSIS — M545 Low back pain, unspecified: Secondary | ICD-10-CM | POA: Diagnosis not present

## 2022-03-07 DIAGNOSIS — M25551 Pain in right hip: Secondary | ICD-10-CM | POA: Diagnosis not present

## 2022-03-07 NOTE — Telephone Encounter (Signed)
Pt is returning call. Transferred to [place], CMA.

## 2022-03-07 NOTE — Telephone Encounter (Signed)
Patient notified

## 2022-03-07 NOTE — Telephone Encounter (Signed)
Returned patient call at requested number and LM.

## 2022-03-14 DIAGNOSIS — M545 Low back pain, unspecified: Secondary | ICD-10-CM | POA: Diagnosis not present

## 2022-03-14 DIAGNOSIS — M25551 Pain in right hip: Secondary | ICD-10-CM | POA: Diagnosis not present

## 2022-03-18 DIAGNOSIS — M1611 Unilateral primary osteoarthritis, right hip: Secondary | ICD-10-CM | POA: Diagnosis not present

## 2022-03-18 DIAGNOSIS — M545 Low back pain, unspecified: Secondary | ICD-10-CM | POA: Diagnosis not present

## 2022-03-20 DIAGNOSIS — I4891 Unspecified atrial fibrillation: Secondary | ICD-10-CM | POA: Diagnosis not present

## 2022-03-20 DIAGNOSIS — I10 Essential (primary) hypertension: Secondary | ICD-10-CM | POA: Diagnosis not present

## 2022-03-20 DIAGNOSIS — K219 Gastro-esophageal reflux disease without esophagitis: Secondary | ICD-10-CM | POA: Diagnosis not present

## 2022-03-20 DIAGNOSIS — R1310 Dysphagia, unspecified: Secondary | ICD-10-CM | POA: Diagnosis not present

## 2022-03-20 DIAGNOSIS — Z79899 Other long term (current) drug therapy: Secondary | ICD-10-CM | POA: Diagnosis not present

## 2022-03-20 DIAGNOSIS — Z7984 Long term (current) use of oral hypoglycemic drugs: Secondary | ICD-10-CM | POA: Diagnosis not present

## 2022-03-20 DIAGNOSIS — E119 Type 2 diabetes mellitus without complications: Secondary | ICD-10-CM | POA: Diagnosis not present

## 2022-03-20 DIAGNOSIS — Z86010 Personal history of colonic polyps: Secondary | ICD-10-CM | POA: Diagnosis not present

## 2022-03-20 DIAGNOSIS — R110 Nausea: Secondary | ICD-10-CM | POA: Diagnosis not present

## 2022-03-20 DIAGNOSIS — K222 Esophageal obstruction: Secondary | ICD-10-CM | POA: Diagnosis not present

## 2022-03-20 DIAGNOSIS — K449 Diaphragmatic hernia without obstruction or gangrene: Secondary | ICD-10-CM | POA: Diagnosis not present

## 2022-03-20 DIAGNOSIS — K5730 Diverticulosis of large intestine without perforation or abscess without bleeding: Secondary | ICD-10-CM | POA: Diagnosis not present

## 2022-03-21 DIAGNOSIS — M25551 Pain in right hip: Secondary | ICD-10-CM | POA: Diagnosis not present

## 2022-03-21 DIAGNOSIS — M545 Low back pain, unspecified: Secondary | ICD-10-CM | POA: Diagnosis not present

## 2022-03-27 DIAGNOSIS — M25551 Pain in right hip: Secondary | ICD-10-CM | POA: Diagnosis not present

## 2022-03-27 DIAGNOSIS — D510 Vitamin B12 deficiency anemia due to intrinsic factor deficiency: Secondary | ICD-10-CM | POA: Diagnosis not present

## 2022-03-27 DIAGNOSIS — M545 Low back pain, unspecified: Secondary | ICD-10-CM | POA: Diagnosis not present

## 2022-04-08 DIAGNOSIS — M25551 Pain in right hip: Secondary | ICD-10-CM | POA: Diagnosis not present

## 2022-04-08 DIAGNOSIS — M545 Low back pain, unspecified: Secondary | ICD-10-CM | POA: Diagnosis not present

## 2022-04-09 DIAGNOSIS — E7800 Pure hypercholesterolemia, unspecified: Secondary | ICD-10-CM | POA: Diagnosis not present

## 2022-04-09 LAB — LIPID PANEL
Chol/HDL Ratio: 2.9 ratio (ref 0.0–4.4)
Cholesterol, Total: 173 mg/dL (ref 100–199)
HDL: 60 mg/dL (ref >39)
LDL Chol Calc (NIH): 96 mg/dL (ref 0–99)
Triglycerides: 95 mg/dL (ref 0–149)
VLDL Cholesterol Cal: 17 mg/dL (ref 5–40)

## 2022-04-09 LAB — AST: AST: 16 IU/L (ref 0–40)

## 2022-04-09 LAB — ALT: ALT: 8 IU/L (ref 0–32)

## 2022-04-11 ENCOUNTER — Telehealth: Payer: Self-pay

## 2022-04-11 ENCOUNTER — Other Ambulatory Visit: Payer: Self-pay | Admitting: Cardiology

## 2022-04-11 DIAGNOSIS — E7800 Pure hypercholesterolemia, unspecified: Secondary | ICD-10-CM

## 2022-04-11 MED ORDER — ROSUVASTATIN CALCIUM 20 MG PO TABS: 20.0000 mg | ORAL_TABLET | Freq: Every day | ORAL | 0 refills | Status: DC

## 2022-04-11 NOTE — Telephone Encounter (Signed)
Patient aware of results and recommendation and agreed to plan. Medication list updated and script sent with instruction to fill for the name brand only as the patient is allergic to the generic brand. Lab order on file. Aware to fast for labs

## 2022-04-11 NOTE — Telephone Encounter (Signed)
-----   Message from Park Liter, MD sent at 04/10/2022  9:54 AM EST ----- Cholesterol dramatically better but need to be even better.  So increase Crestor from 10-20 daily, fasting lipid profile, AST LT 6 weeks

## 2022-04-14 DIAGNOSIS — M545 Low back pain, unspecified: Secondary | ICD-10-CM | POA: Diagnosis not present

## 2022-04-14 DIAGNOSIS — M25551 Pain in right hip: Secondary | ICD-10-CM | POA: Diagnosis not present

## 2022-04-16 NOTE — Telephone Encounter (Signed)
Spoke to the pharmacy, they informed me across the bord the Crestor brand is no longer being made. I called and LM to return my call. I forward this message to Dr. Raliegh Ip to advise weather he want to make changes.

## 2022-04-17 ENCOUNTER — Telehealth: Payer: Self-pay | Admitting: Cardiology

## 2022-04-17 NOTE — Telephone Encounter (Signed)
Patient stated she will need to have Dr. Agustin Cree sent a letter to her insurance company stating she can only take Crestor and have them approve her increased dosage to 20 mg.

## 2022-04-17 NOTE — Telephone Encounter (Signed)
Spoke with pt. Advised that a message has been sent to Dr. Agustin Cree about her cholesterol medication and we will call her once he has answered the message.

## 2022-04-21 DIAGNOSIS — M545 Low back pain, unspecified: Secondary | ICD-10-CM | POA: Diagnosis not present

## 2022-04-21 DIAGNOSIS — M25551 Pain in right hip: Secondary | ICD-10-CM | POA: Diagnosis not present

## 2022-04-22 ENCOUNTER — Telehealth: Payer: Self-pay

## 2022-04-24 ENCOUNTER — Telehealth: Payer: Self-pay

## 2022-04-24 NOTE — Telephone Encounter (Signed)
Message sent to Dr. Raliegh Ip for further instructions

## 2022-04-29 MED ORDER — EZETIMIBE 10 MG PO TABS: 10.0000 mg | ORAL_TABLET | Freq: Every day | ORAL | 1 refills | Status: DC

## 2022-04-29 NOTE — Addendum Note (Signed)
Addended by: Darrel Reach on: 04/29/2022 10:25 AM   Modules accepted: Orders

## 2022-04-29 NOTE — Telephone Encounter (Signed)
Patient notified of the following per Dr. Era Bumpers '10mg'$  daily). Patient agreed with plan. Medication sent to confirmed pharmacy

## 2022-04-30 DIAGNOSIS — E538 Deficiency of other specified B group vitamins: Secondary | ICD-10-CM | POA: Diagnosis not present

## 2022-05-14 DIAGNOSIS — D2239 Melanocytic nevi of other parts of face: Secondary | ICD-10-CM | POA: Diagnosis not present

## 2022-05-14 DIAGNOSIS — L578 Other skin changes due to chronic exposure to nonionizing radiation: Secondary | ICD-10-CM | POA: Diagnosis not present

## 2022-05-14 DIAGNOSIS — L821 Other seborrheic keratosis: Secondary | ICD-10-CM | POA: Diagnosis not present

## 2022-05-14 DIAGNOSIS — L814 Other melanin hyperpigmentation: Secondary | ICD-10-CM | POA: Diagnosis not present

## 2022-05-14 DIAGNOSIS — D485 Neoplasm of uncertain behavior of skin: Secondary | ICD-10-CM | POA: Diagnosis not present

## 2022-05-16 DIAGNOSIS — H2511 Age-related nuclear cataract, right eye: Secondary | ICD-10-CM | POA: Diagnosis not present

## 2022-05-16 DIAGNOSIS — Z01818 Encounter for other preprocedural examination: Secondary | ICD-10-CM | POA: Diagnosis not present

## 2022-05-16 DIAGNOSIS — H2513 Age-related nuclear cataract, bilateral: Secondary | ICD-10-CM | POA: Diagnosis not present

## 2022-05-19 DIAGNOSIS — Z1231 Encounter for screening mammogram for malignant neoplasm of breast: Secondary | ICD-10-CM | POA: Diagnosis not present

## 2022-05-21 DIAGNOSIS — E7800 Pure hypercholesterolemia, unspecified: Secondary | ICD-10-CM | POA: Diagnosis not present

## 2022-05-22 DIAGNOSIS — K635 Polyp of colon: Secondary | ICD-10-CM | POA: Diagnosis not present

## 2022-05-22 DIAGNOSIS — K5790 Diverticulosis of intestine, part unspecified, without perforation or abscess without bleeding: Secondary | ICD-10-CM | POA: Diagnosis not present

## 2022-05-22 DIAGNOSIS — K219 Gastro-esophageal reflux disease without esophagitis: Secondary | ICD-10-CM | POA: Diagnosis not present

## 2022-05-22 LAB — LIPID PANEL
Chol/HDL Ratio: 2.8 ratio (ref 0.0–4.4)
Cholesterol, Total: 149 mg/dL (ref 100–199)
HDL: 54 mg/dL (ref >39)
LDL Chol Calc (NIH): 79 mg/dL (ref 0–99)
Triglycerides: 82 mg/dL (ref 0–149)
VLDL Cholesterol Cal: 16 mg/dL (ref 5–40)

## 2022-05-22 LAB — AST: AST: 15 IU/L (ref 0–40)

## 2022-05-22 LAB — ALT: ALT: 11 IU/L (ref 0–32)

## 2022-06-02 NOTE — Telephone Encounter (Signed)
error 

## 2022-06-03 DIAGNOSIS — H2511 Age-related nuclear cataract, right eye: Secondary | ICD-10-CM | POA: Diagnosis not present

## 2022-06-03 DIAGNOSIS — H25811 Combined forms of age-related cataract, right eye: Secondary | ICD-10-CM | POA: Diagnosis not present

## 2022-06-03 DIAGNOSIS — E1136 Type 2 diabetes mellitus with diabetic cataract: Secondary | ICD-10-CM | POA: Diagnosis not present

## 2022-06-03 DIAGNOSIS — Z79899 Other long term (current) drug therapy: Secondary | ICD-10-CM | POA: Diagnosis not present

## 2022-06-03 DIAGNOSIS — H259 Unspecified age-related cataract: Secondary | ICD-10-CM | POA: Diagnosis not present

## 2022-06-03 DIAGNOSIS — E119 Type 2 diabetes mellitus without complications: Secondary | ICD-10-CM | POA: Diagnosis not present

## 2022-06-03 DIAGNOSIS — Z7984 Long term (current) use of oral hypoglycemic drugs: Secondary | ICD-10-CM | POA: Diagnosis not present

## 2022-06-03 DIAGNOSIS — Z7982 Long term (current) use of aspirin: Secondary | ICD-10-CM | POA: Diagnosis not present

## 2022-06-05 DIAGNOSIS — E1129 Type 2 diabetes mellitus with other diabetic kidney complication: Secondary | ICD-10-CM | POA: Diagnosis not present

## 2022-06-05 DIAGNOSIS — Z0000 Encounter for general adult medical examination without abnormal findings: Secondary | ICD-10-CM | POA: Diagnosis not present

## 2022-06-05 DIAGNOSIS — F419 Anxiety disorder, unspecified: Secondary | ICD-10-CM | POA: Diagnosis not present

## 2022-06-05 DIAGNOSIS — E7800 Pure hypercholesterolemia, unspecified: Secondary | ICD-10-CM | POA: Diagnosis not present

## 2022-06-09 DIAGNOSIS — E538 Deficiency of other specified B group vitamins: Secondary | ICD-10-CM | POA: Diagnosis not present

## 2022-06-17 DIAGNOSIS — Z7982 Long term (current) use of aspirin: Secondary | ICD-10-CM | POA: Diagnosis not present

## 2022-06-17 DIAGNOSIS — H2512 Age-related nuclear cataract, left eye: Secondary | ICD-10-CM | POA: Diagnosis not present

## 2022-06-17 DIAGNOSIS — E1136 Type 2 diabetes mellitus with diabetic cataract: Secondary | ICD-10-CM | POA: Diagnosis not present

## 2022-06-17 DIAGNOSIS — Z79899 Other long term (current) drug therapy: Secondary | ICD-10-CM | POA: Diagnosis not present

## 2022-06-17 DIAGNOSIS — E119 Type 2 diabetes mellitus without complications: Secondary | ICD-10-CM | POA: Diagnosis not present

## 2022-06-17 DIAGNOSIS — H25812 Combined forms of age-related cataract, left eye: Secondary | ICD-10-CM | POA: Diagnosis not present

## 2022-06-17 DIAGNOSIS — Z7984 Long term (current) use of oral hypoglycemic drugs: Secondary | ICD-10-CM | POA: Diagnosis not present

## 2022-06-17 DIAGNOSIS — H259 Unspecified age-related cataract: Secondary | ICD-10-CM | POA: Diagnosis not present

## 2022-07-17 DIAGNOSIS — D519 Vitamin B12 deficiency anemia, unspecified: Secondary | ICD-10-CM | POA: Diagnosis not present

## 2022-08-09 ENCOUNTER — Other Ambulatory Visit: Payer: Self-pay | Admitting: Cardiology

## 2022-08-18 DIAGNOSIS — D510 Vitamin B12 deficiency anemia due to intrinsic factor deficiency: Secondary | ICD-10-CM | POA: Diagnosis not present

## 2022-09-04 DIAGNOSIS — N3091 Cystitis, unspecified with hematuria: Secondary | ICD-10-CM | POA: Diagnosis not present

## 2022-09-04 DIAGNOSIS — R350 Frequency of micturition: Secondary | ICD-10-CM | POA: Diagnosis not present

## 2022-09-04 DIAGNOSIS — R300 Dysuria: Secondary | ICD-10-CM | POA: Diagnosis not present

## 2022-09-12 DIAGNOSIS — R300 Dysuria: Secondary | ICD-10-CM | POA: Diagnosis not present

## 2022-09-12 DIAGNOSIS — Z6823 Body mass index (BMI) 23.0-23.9, adult: Secondary | ICD-10-CM | POA: Diagnosis not present

## 2022-10-10 DIAGNOSIS — S299XXA Unspecified injury of thorax, initial encounter: Secondary | ICD-10-CM | POA: Diagnosis not present

## 2022-10-10 DIAGNOSIS — R079 Chest pain, unspecified: Secondary | ICD-10-CM | POA: Diagnosis not present

## 2022-10-10 DIAGNOSIS — E119 Type 2 diabetes mellitus without complications: Secondary | ICD-10-CM | POA: Diagnosis not present

## 2022-10-10 DIAGNOSIS — Z7984 Long term (current) use of oral hypoglycemic drugs: Secondary | ICD-10-CM | POA: Diagnosis not present

## 2022-10-10 DIAGNOSIS — I700 Atherosclerosis of aorta: Secondary | ICD-10-CM | POA: Diagnosis not present

## 2022-10-10 DIAGNOSIS — I471 Supraventricular tachycardia, unspecified: Secondary | ICD-10-CM | POA: Diagnosis not present

## 2022-10-10 DIAGNOSIS — Z888 Allergy status to other drugs, medicaments and biological substances status: Secondary | ICD-10-CM | POA: Diagnosis not present

## 2022-10-10 DIAGNOSIS — K219 Gastro-esophageal reflux disease without esophagitis: Secondary | ICD-10-CM | POA: Diagnosis not present

## 2022-10-10 DIAGNOSIS — K8020 Calculus of gallbladder without cholecystitis without obstruction: Secondary | ICD-10-CM | POA: Diagnosis not present

## 2022-10-10 DIAGNOSIS — K8010 Calculus of gallbladder with chronic cholecystitis without obstruction: Secondary | ICD-10-CM | POA: Diagnosis not present

## 2022-10-10 DIAGNOSIS — I10 Essential (primary) hypertension: Secondary | ICD-10-CM | POA: Diagnosis not present

## 2022-10-10 DIAGNOSIS — Z7982 Long term (current) use of aspirin: Secondary | ICD-10-CM | POA: Diagnosis not present

## 2022-10-10 DIAGNOSIS — Z885 Allergy status to narcotic agent status: Secondary | ICD-10-CM | POA: Diagnosis not present

## 2022-10-14 DIAGNOSIS — N952 Postmenopausal atrophic vaginitis: Secondary | ICD-10-CM | POA: Diagnosis not present

## 2022-10-14 DIAGNOSIS — N362 Urethral caruncle: Secondary | ICD-10-CM | POA: Diagnosis not present

## 2022-10-14 DIAGNOSIS — N958 Other specified menopausal and perimenopausal disorders: Secondary | ICD-10-CM | POA: Diagnosis not present

## 2022-10-17 DIAGNOSIS — Z9049 Acquired absence of other specified parts of digestive tract: Secondary | ICD-10-CM | POA: Diagnosis not present

## 2022-10-17 DIAGNOSIS — Z6823 Body mass index (BMI) 23.0-23.9, adult: Secondary | ICD-10-CM | POA: Diagnosis not present

## 2022-10-20 ENCOUNTER — Other Ambulatory Visit: Payer: Self-pay | Admitting: Cardiology

## 2022-11-11 ENCOUNTER — Other Ambulatory Visit: Payer: Self-pay

## 2022-11-11 DIAGNOSIS — E7800 Pure hypercholesterolemia, unspecified: Secondary | ICD-10-CM

## 2022-11-11 LAB — LIPID PANEL
Chol/HDL Ratio: 3.5 ratio (ref 0.0–4.4)
Cholesterol, Total: 191 mg/dL (ref 100–199)
HDL: 55 mg/dL (ref >39)
LDL Chol Calc (NIH): 119 mg/dL — ABNORMAL HIGH (ref 0–99)
Triglycerides: 94 mg/dL (ref 0–149)
VLDL Cholesterol Cal: 17 mg/dL (ref 5–40)

## 2022-11-11 LAB — ALT: ALT: 11 IU/L (ref 0–32)

## 2022-11-11 LAB — AST: AST: 18 IU/L (ref 0–40)

## 2022-11-11 NOTE — Telephone Encounter (Signed)
Pt on Zetia for 3-4 months. Here for follow up blood work. Lipid, AST, ALT ordered.

## 2022-11-14 ENCOUNTER — Ambulatory Visit (INDEPENDENT_AMBULATORY_CARE_PROVIDER_SITE_OTHER): Payer: PPO

## 2022-11-14 ENCOUNTER — Encounter: Payer: Self-pay | Admitting: Cardiology

## 2022-11-14 ENCOUNTER — Ambulatory Visit: Payer: PPO | Attending: Cardiology | Admitting: Cardiology

## 2022-11-14 VITALS — BP 140/60 | HR 78 | Ht 65.0 in | Wt 140.6 lb

## 2022-11-14 DIAGNOSIS — I739 Peripheral vascular disease, unspecified: Secondary | ICD-10-CM | POA: Diagnosis not present

## 2022-11-14 DIAGNOSIS — E119 Type 2 diabetes mellitus without complications: Secondary | ICD-10-CM

## 2022-11-14 DIAGNOSIS — E7800 Pure hypercholesterolemia, unspecified: Secondary | ICD-10-CM | POA: Diagnosis not present

## 2022-11-14 DIAGNOSIS — R55 Syncope and collapse: Secondary | ICD-10-CM

## 2022-11-14 DIAGNOSIS — Z7984 Long term (current) use of oral hypoglycemic drugs: Secondary | ICD-10-CM

## 2022-11-14 DIAGNOSIS — I471 Supraventricular tachycardia, unspecified: Secondary | ICD-10-CM | POA: Diagnosis not present

## 2022-11-14 DIAGNOSIS — Z9889 Other specified postprocedural states: Secondary | ICD-10-CM

## 2022-11-14 NOTE — Progress Notes (Signed)
Cardiology Office Note:    Date:  11/14/2022   ID:  Iva, Bjorkman 11/28/1940, MRN [id number]  PCP:  [facility]  Cardiologist:  Gypsy Balsam, MD    Referring MD: Noni Saupe, MD   Chief Complaint  Patient presents with   Dizziness    History of Present Illness:    ZAYDE BURGUS is a 82 y.o. female with past medical history significant for supraventricular tachycardia which is fairly controlled with calcium channel blocker, essential hypertension, diabetes, dyslipidemia, peripheral vascular disease in form of 60% stenosis in the carotid artery.  Comes today 2 months of follow-up.  Recently 4 weeks ago she got gallbladder surgery she went to surgery quite good without any issues however after that week ago she started walking and exercising on the regular basis, on Monday she was walking and suddenly started feeling very weak tired exhausted she was brought home she checked her heart rate apparently was 150 shortly after that everything came back to normal but since that time she feels weak tired exhausted, EKG today shows sinus rhythm.  Denies have any chest pain tightness squeezing pressure burning chest.  Interestingly she complained of having some neck pain and upper neck pain she said she have difficulty sleeping because uncomfortable to find comfortable position.  Past Medical History:  Diagnosis Date   Chest discomfort    nl myoview 2010   Diabetes mellitus    type 2- x 12 years   Diabetes mellitus (HCC) 05/31/2018   Dysrhythmia    h/o rapid heart beat   GERD (gastroesophageal reflux disease)    History of cystocele 01/09/2020   Hypercholesteremia    Hyperlipidemia 10/21/2010   Left sided sciatica 05/08/2016   Other symptoms involving cardiovascular system 02/28/2009   Qualifier: Diagnosis of  By: Al-Rammal, RVT, RDCS, Missy     Pain in pelvis 01/09/2020   Palpitations    Paroxysmal SVT (supraventricular tachycardia) 05/11/2018   Peripheral  vascular disease, unspecified (HCC) up to 60% right internal carotid artery 07/05/2019   PONV (postoperative nausea and vomiting)    Postoperative state 04/19/2014   Pulsatile abdominal mass    neg U/S 2010   S/P TKR (total knee replacement) 09/30/2012   Second degree uterine prolapse 01/09/2020   SVD (spontaneous vaginal delivery)    x 2    Past Surgical History:  Procedure Laterality Date   APPENDECTOMY     BILATERAL SALPINGECTOMY  04/19/2014   Procedure: BILATERAL SALPINGECTOMY;  Surgeon: Loney Laurence, MD;  Location: [facility];  Service: Gynecology;;   BLADDER SUSPENSION N/A 04/19/2014   Procedure: TRANSVAGINAL TAPE (TVT) PROCEDURE;  Surgeon: Loney Laurence, MD;  Location: [facility];  Service: Gynecology;  Laterality: N/A;   BREAST BIOPSY Left    BREAST LUMPECTOMY WITH RADIOACTIVE SEED LOCALIZATION Left 06/06/2020   Procedure: LEFT BREAST LUMPECTOMY WITH RADIOACTIVE SEED LOCALIZATION;  Surgeon: Manus Rudd, MD;  Location: [facility];  Service: General;  Laterality: Left;   COLONOSCOPY     CYSTOCELE REPAIR N/A 04/19/2014   Procedure: ANTERIOR REPAIR (CYSTOCELE);  Surgeon: Loney Laurence, MD;  Location: [facility];  Service: Gynecology;  Laterality: N/A;   CYSTOSCOPY N/A 04/19/2014   Procedure: CYSTOSCOPY;  Surgeon: Loney Laurence, MD;  Location: [facility];  Service: Gynecology;  Laterality: N/A;   TOTAL KNEE ARTHROPLASTY     VAGINAL HYSTERECTOMY N/A 04/19/2014   Procedure: HYSTERECTOMY VAGINAL;  Surgeon: Loney Laurence, MD;  Location: [facility];  Service: Gynecology;  Laterality: N/A;    Current Medications: Current Meds  Medication Sig   aspirin 81 MG tablet Take 81 mg by mouth daily.   benzonatate (TESSALON) 200 MG capsule Take 200 mg by mouth 3 (three) times daily as needed for cough.   Calcium Carbonate-Vitamin D (CALCIUM 600 + D PO) Take 1 tablet by mouth 2 (two) times daily.   Coenzyme Q10 (CO Q 10 PO) Take 100 mg by mouth daily.   cyanocobalamin (,VITAMIN  B-12,) 1000 MCG/ML injection Inject 1,000 mcg into the muscle every 30 (thirty) days.   diazepam (VALIUM) 5 MG tablet Take 5 mg by mouth as needed for anxiety or sleep.   diltiazem (CARDIZEM CD) 240 MG 24 hr capsule Take 1 capsule by mouth once daily   diltiazem (CARDIZEM) 30 MG tablet Take 1 tablet every 8 hours ONLY AS NEEDED for palpitations (Patient taking differently: Take 30 mg by mouth as needed (palpitations). Take 1 tablet every 8 hours ONLY AS NEEDED for palpitations)   ergocalciferol (VITAMIN D2) 50000 UNITS capsule Take 50,000 Units by mouth every 14 (fourteen) days.   ezetimibe (ZETIA) 10 MG tablet Take 1 tablet by mouth once daily   fenofibrate (TRICOR) 145 MG tablet Take 1 tablet (145 mg total) by mouth daily.   losartan (COZAAR) 25 MG tablet Take 1 tablet (25 mg total) by mouth daily.   metFORMIN (GLUCOPHAGE) 850 MG tablet Take 850 mg by mouth 2 (two) times daily with a meal.   omeprazole (PRILOSEC) 20 MG capsule Take 20 mg by mouth as needed (Reflux).   [DISCONTINUED] furosemide (LASIX) 20 MG tablet Take 1 tablet (20 mg total) by mouth daily. (Patient taking differently: Take 20 mg by mouth as needed for fluid.)   [DISCONTINUED] rosuvastatin (CRESTOR) 20 MG tablet Take 1 tablet (20 mg total) by mouth daily. Dispense NAME BRAND ONLY, patient is ALLERGIC to the generic brand.     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  Tobacco Use   Smoking status: Never   Smokeless tobacco: Never  Vaping Use   Vaping status: Never Used  Substance and Sexual Activity   Alcohol use: No   Drug use: No   Sexual activity: Yes    Birth control/protection: Post-menopausal  Other Topics Concern   Not  on file  Social History Narrative   Not on file   Social Determinants of Health   Financial Resource Strain: Not on file  Food Insecurity: No Food Insecurity (09/24/2021)   Received from [facility], [facility]   Hunger Vital Sign    Worried About Running Out of Food in the Last Year: Never true    Ran Out of Food in the Last Year: Never true  Transportation Needs: Not on file  Physical Activity: Not on file  Stress: No Stress Concern Present (02/14/2021)   Received from [facility], [facility]   [employer] [facility] - Occupational Stress Questionnaire    Feeling of Stress : Not at all  Social Connections: Unknown (08/09/2021)   Received from [facility], [facility]   Social Network    Social Network: Not on file  Family History: The patient's family history includes Diabetes in an other family member; Heart disease in an other family member; Hyperlipidemia in an other family member; Hypertension in an other family member. ROS:   Please see the history of present illness.    All 14 point review of systems negative except as described per history of present illness  EKGs/Labs/Other Studies Reviewed:    EKG Interpretation Date/Time:  Friday November 14 2022 13:32:51 EDT Ventricular Rate:  78 PR Interval:  172 QRS Duration:  120 QT Interval:  372 QTC Calculation: 424 R Axis:   -57  Text Interpretation: Sinus rhythm with Premature ventricular complexes or Fusion complexes Indeterminate axis Right bundle branch block Inferior infarct (cited on or before 14-Jul-2006) Possible Anterolateral infarct (cited on or before 14-Jul-2006) Abnormal ECG When compared with ECG of 14-Jul-2006 10:42, Fusion complexes are now Present Premature ventricular complexes are now Present Right bundle branch block is now Present Questionable change in initial forces of Lateral leads Confirmed by Gypsy Balsam [phone]) on 11/14/2022 1:43:28 PM    Recent  Labs: 11/11/2022: ALT 11  Recent Lipid Panel    Component Value Date/Time   CHOL 191 11/11/2022 0854   TRIG 94 11/11/2022 0854   HDL 55 11/11/2022 0854   CHOLHDL 3.5 11/11/2022 0854   LDLCALC 119 (H) 11/11/2022 0854    Physical Exam:    VS:  BP (!) 140/60 (BP Location: Left Arm, Patient Position: Sitting)   Pulse 78   Ht 5\' 5"  (1.651 m)   Wt 140 lb 9.6 oz (63.8 kg)   SpO2 95%   BMI 23.40 kg/m     Wt Readings from Last 3 Encounters:  11/14/22 140 lb 9.6 oz (63.8 kg)  02/19/22 141 lb 9.6 oz (64.2 kg)  08/09/21 138 lb 12.8 oz (63 kg)     GEN:  Well nourished, well developed in no acute distress HEENT: Normal NECK: No JVD; No carotid bruits LYMPHATICS: No lymphadenopathy CARDIAC: RRR, no murmurs, no rubs, no gallops RESPIRATORY:  Clear to auscultation without rales, wheezing or rhonchi  ABDOMEN: Soft, non-tender, non-distended MUSCULOSKELETAL:  No edema; No deformity  SKIN: Warm and dry LOWER EXTREMITIES: no swelling NEUROLOGIC:  Alert and oriented x 3 PSYCHIATRIC:  Normal affect   ASSESSMENT:    1. Paroxysmal SVT (supraventricular tachycardia)   2. Peripheral vascular disease, unspecified (HCC) up to 60% right internal carotid artery   3. Type 2 diabetes mellitus without complication, without long-term current use of insulin (HCC)   4. Hypercholesteremia   5. Postoperative state    PLAN:    In order of problems listed above:  Supraventricular tachycardia she described to have some palpitations with profound weakness obviously concerns about recurrences of supraventricular tachycardia, I put monitor on her for at least a week to see if she get any supraventricular tachycardia recurrences. Peripheral vascular disease we will schedule her to have carotic ultrasound. Type 2 diabetes stable followed by antimedicine team. Overall not wellbeing, I will ask her to have CBC complete metabolic panel done to make sure were not missing any significant pathology, will do  troponin even though I have lower suspicion that.  That would be revealing   Medication Adjustments/Labs and Tests Ordered: Current medicines are reviewed at length with the patient today.  Concerns regarding medicines are outlined above.  Orders Placed This Encounter  Procedures   EKG 12-Lead   Medication changes: No orders of the defined types were placed in this encounter.   Signed, Marveen Reeks.  Bing Matter, MD, [facility] 11/14/2022 1:56 PM    [facility]

## 2022-11-14 NOTE — Patient Instructions (Addendum)
Medication Instructions:  Your physician recommends that you continue on your current medications as directed. Please refer to the Current Medication list given to you today.  *If you need a refill on your cardiac medications before your next appointment, please call your pharmacy*   Lab Work: CMP, CBC, Troponin- today If you have labs (blood work) drawn today and your tests are completely normal, you will receive your results only by: MyChart Message (if you have MyChart) OR A paper copy in the mail If you have any lab test that is abnormal or we need to change your treatment, we will call you to review the results.   Testing/Procedures:  WHY IS MY DOCTOR PRESCRIBING ZIO? The Zio system is proven and trusted by physicians to detect and diagnose irregular heart rhythms -- and has been prescribed to hundreds of thousands of patients.  The FDA has cleared the Zio system to monitor for many different kinds of irregular heart rhythms. In a study, physicians were able to reach a diagnosis 90% of the time with the Zio system1.  You can wear the Zio monitor -- a small, discreet, comfortable patch -- during your normal day-to-day activity, including while you sleep, shower, and exercise, while it records every single heartbeat for analysis.  1Barrett, P., et al. Comparison of 24 Hour Holter Monitoring Versus 14 Day Novel Adhesive Patch Electrocardiographic Monitoring. American Journal of Medicine, 2014.  ZIO VS. HOLTER MONITORING The Zio monitor can be comfortably worn for up to 14 days. Holter monitors can be worn for 24 to 48 hours, limiting the time to record any irregular heart rhythms you may have. Zio is able to capture data for the 51% of patients who have their first symptom-triggered arrhythmia after 48 hours.1  LIVE WITHOUT RESTRICTIONS The Zio ambulatory cardiac monitor is a small, unobtrusive, and water-resistant patch--you might even forget you're wearing it. The Zio monitor records  and stores every beat of your heart, whether you're sleeping, working out, or showering.     Follow-Up: At [facility], you and your health needs are our priority.  As part of our continuing mission to provide you with exceptional heart care, we have created designated Provider Care Teams.  These Care Teams include your primary Cardiologist (physician) and Advanced Practice Providers (APPs -  Physician Assistants and Nurse Practitioners) who all work together to provide you with the care you need, when you need it.  We recommend signing up for the patient portal called "MyChart".  Sign up information is provided on this After Visit Summary.  MyChart is used to connect with patients for Virtual Visits (Telemedicine).  Patients are able to view lab/test results, encounter notes, upcoming appointments, etc.  Non-urgent messages can be sent to your provider as well.   To learn more about what you can do with MyChart, go to [email or web address].    Your next appointment:   2 month(s)  The format for your next appointment:   In Person  Provider:   Gypsy Balsam, MD    Other Instructions NA

## 2022-11-14 NOTE — Addendum Note (Signed)
Addended by: Baldo Ash D on: 11/14/2022 02:16 PM   Modules accepted: Orders

## 2022-11-18 DIAGNOSIS — E119 Type 2 diabetes mellitus without complications: Secondary | ICD-10-CM | POA: Diagnosis not present

## 2022-11-18 DIAGNOSIS — R350 Frequency of micturition: Secondary | ICD-10-CM | POA: Diagnosis not present

## 2022-11-18 DIAGNOSIS — N390 Urinary tract infection, site not specified: Secondary | ICD-10-CM | POA: Diagnosis not present

## 2022-11-18 DIAGNOSIS — R8290 Unspecified abnormal findings in urine: Secondary | ICD-10-CM | POA: Diagnosis not present

## 2022-11-18 DIAGNOSIS — R319 Hematuria, unspecified: Secondary | ICD-10-CM | POA: Diagnosis not present

## 2022-11-18 DIAGNOSIS — R399 Unspecified symptoms and signs involving the genitourinary system: Secondary | ICD-10-CM | POA: Diagnosis not present

## 2022-11-18 DIAGNOSIS — N200 Calculus of kidney: Secondary | ICD-10-CM | POA: Diagnosis not present

## 2022-11-18 DIAGNOSIS — N3000 Acute cystitis without hematuria: Secondary | ICD-10-CM | POA: Diagnosis not present

## 2022-11-19 ENCOUNTER — Encounter (HOSPITAL_COMMUNITY): Payer: Self-pay

## 2022-11-19 ENCOUNTER — Emergency Department (HOSPITAL_COMMUNITY): Payer: PPO

## 2022-11-19 ENCOUNTER — Telehealth: Payer: Self-pay

## 2022-11-19 ENCOUNTER — Other Ambulatory Visit: Payer: Self-pay

## 2022-11-19 ENCOUNTER — Emergency Department (HOSPITAL_COMMUNITY)
Admission: EM | Admit: 2022-11-19 | Discharge: 2022-11-19 | Disposition: A | Discharge status: Home / Self Care | Payer: PPO | Attending: Emergency Medicine | Admitting: Emergency Medicine

## 2022-11-19 DIAGNOSIS — R002 Palpitations: Secondary | ICD-10-CM | POA: Diagnosis not present

## 2022-11-19 DIAGNOSIS — E119 Type 2 diabetes mellitus without complications: Secondary | ICD-10-CM | POA: Diagnosis not present

## 2022-11-19 DIAGNOSIS — R000 Tachycardia, unspecified: Secondary | ICD-10-CM | POA: Diagnosis not present

## 2022-11-19 DIAGNOSIS — I498 Other specified cardiac arrhythmias: Secondary | ICD-10-CM

## 2022-11-19 DIAGNOSIS — R008 Other abnormalities of heart beat: Secondary | ICD-10-CM | POA: Diagnosis not present

## 2022-11-19 DIAGNOSIS — Z7982 Long term (current) use of aspirin: Secondary | ICD-10-CM | POA: Diagnosis not present

## 2022-11-19 DIAGNOSIS — Z7984 Long term (current) use of oral hypoglycemic drugs: Secondary | ICD-10-CM | POA: Diagnosis not present

## 2022-11-19 DIAGNOSIS — I700 Atherosclerosis of aorta: Secondary | ICD-10-CM | POA: Diagnosis not present

## 2022-11-19 DIAGNOSIS — D485 Neoplasm of uncertain behavior of skin: Secondary | ICD-10-CM | POA: Diagnosis not present

## 2022-11-19 LAB — CBC
HCT: 41.1 % (ref 36.0–46.0)
Hemoglobin: 12.5 g/dL (ref 12.0–15.0)
MCH: 25.7 pg — ABNORMAL LOW (ref 26.0–34.0)
MCHC: 30.4 g/dL (ref 30.0–36.0)
MCV: 84.4 fL (ref 80.0–100.0)
Platelets: 464 10*3/uL — ABNORMAL HIGH (ref 150–400)
RBC: 4.87 MIL/uL (ref 3.87–5.11)
RDW: 14.8 % (ref 11.5–15.5)
WBC: 13.7 10*3/uL — ABNORMAL HIGH (ref 4.0–10.5)
nRBC: 0 % (ref 0.0–0.2)

## 2022-11-19 LAB — BASIC METABOLIC PANEL
Anion gap: 14 (ref 5–15)
BUN: 16 mg/dL (ref 8–23)
CO2: 20 mmol/L — ABNORMAL LOW (ref 22–32)
Calcium: 10.1 mg/dL (ref 8.9–10.3)
Chloride: 101 mmol/L (ref 98–111)
Creatinine, Ser: 0.69 mg/dL (ref 0.44–1.00)
GFR, Estimated: >60 mL/min (ref >60)
Glucose, Bld: 187 mg/dL — ABNORMAL HIGH (ref 70–99)
Potassium: 3.5 mmol/L (ref 3.5–5.1)
Sodium: 135 mmol/L (ref 135–145)

## 2022-11-19 LAB — MAGNESIUM: Magnesium: 1.7 mg/dL (ref 1.7–2.4)

## 2022-11-19 LAB — TROPONIN I (HIGH SENSITIVITY)
Troponin I (High Sensitivity): 94 ng/L — ABNORMAL HIGH (ref <18)
Troponin I (High Sensitivity): 140 ng/L (ref <18)

## 2022-11-19 MED ORDER — METOPROLOL TARTRATE 5 MG/5ML IV SOLN
5.0000 mg | Freq: Once | INTRAVENOUS | Status: AC
  Administered 2022-11-19: 5 mg via INTRAVENOUS
  Filled 2022-11-19: qty 5

## 2022-11-19 MED ORDER — DILTIAZEM HCL ER COATED BEADS 300 MG PO CP24: 300.0000 mg | ORAL_CAPSULE | Freq: Every day | ORAL | 0 refills | Status: DC

## 2022-11-19 MED ORDER — SODIUM CHLORIDE 0.9 % IV BOLUS (SEPSIS)
500.0000 mL | Freq: Once | INTRAVENOUS | Status: AC
  Administered 2022-11-19: 500 mL via INTRAVENOUS

## 2022-11-19 MED ORDER — ASPIRIN 81 MG PO CHEW
324.0000 mg | CHEWABLE_TABLET | Freq: Once | ORAL | Status: AC
  Administered 2022-11-19: 324 mg via ORAL
  Filled 2022-11-19: qty 4

## 2022-11-19 MED ORDER — SODIUM CHLORIDE 0.9 % IV SOLN
1000.0000 mL | INTRAVENOUS | Status: DC
  Administered 2022-11-19: 1000 mL via INTRAVENOUS

## 2022-11-19 NOTE — Telephone Encounter (Signed)
-----   Message from Gypsy Balsam sent at 11/19/2022  9:41 AM EDT ----- All lab work test that do not look good show kidney function normal liver function normal no evidence of heart attack we will continue monitoring

## 2022-11-19 NOTE — Telephone Encounter (Signed)
Patient notified of results. She also mention , her HR was has extremely high to the point of feeling dizz near syncope ongoing for 4-5 hours. Per Dr. Kirtland Bouchard advised verbally to go to the ED. Patient notified and plans to head out to [facility].

## 2022-11-19 NOTE — ED Provider Notes (Signed)
[place] EMERGENCY DEPARTMENT AT [facility] Provider Note   CSN: [id number] Arrival date & time: 11/19/22  1649     History  Chief Complaint  Patient presents with   Tachycardia    SIMA GENNETTE is a 82 y.o. female.  HPI   Patient has history of hypercholesterolemia, palpitations, diabetes, dysrhythmia, acid reflux and paroxysmal SVT who presents to the ED for evaluation.  Patient has seen Dr. Charyl Bigger cardiology.  Patient was seen in the office 5 days ago for this.  Plan was to proceed with outpatient cardiac monitoring.  Patient presented today for evaluation of rapid heartbeat.  Patient states symptoms have been ongoing since about 11 AM.  She felt her heart racing.  She was feeling lightheaded.  She had some discomfort in her chest but not severe chest pain.  Home Medications Prior to Admission medications   Medication Sig Start Date End Date Taking? Authorizing Provider  aspirin 81 MG tablet Take 81 mg by mouth daily.    [provider]  benzonatate (TESSALON) 200 MG capsule Take 200 mg by mouth 3 (three) times daily as needed for cough. 07/02/20   [provider]  Calcium Carbonate-Vitamin D (CALCIUM 600 + D PO) Take 1 tablet by mouth 2 (two) times daily.    [provider]  Coenzyme Q10 (CO Q 10 PO) Take 100 mg by mouth daily.    [provider]  cyanocobalamin (,VITAMIN B-12,) 1000 MCG/ML injection Inject 1,000 mcg into the muscle every 30 (thirty) days.    [provider]  diazepam (VALIUM) 5 MG tablet Take 5 mg by mouth as needed for anxiety or sleep.    [provider]  diltiazem (CARDIZEM CD) 300 MG 24 hr capsule Take 1 capsule (300 mg total) by mouth daily. 11/19/22   Linwood Dibbles, MD  diltiazem (CARDIZEM) 30 MG tablet Take 1 tablet every 8 hours ONLY AS NEEDED for palpitations Patient taking differently: Take 30 mg by mouth as needed (palpitations). Take 1 tablet every 8 hours ONLY AS NEEDED for  palpitations 08/09/21   Georgeanna Lea, MD  ergocalciferol (VITAMIN D2) 50000 UNITS capsule Take 50,000 Units by mouth every 14 (fourteen) days.    [provider]  ezetimibe (ZETIA) 10 MG tablet Take 1 tablet by mouth once daily 10/20/22   Georgeanna Lea, MD  fenofibrate (TRICOR) 145 MG tablet Take 1 tablet (145 mg total) by mouth daily. 08/11/22   Georgeanna Lea, MD  losartan (COZAAR) 25 MG tablet Take 1 tablet (25 mg total) by mouth daily. 08/09/21   Georgeanna Lea, MD  metFORMIN (GLUCOPHAGE) 850 MG tablet Take 850 mg by mouth 2 (two) times daily with a meal.    [provider]  omeprazole (PRILOSEC) 20 MG capsule Take 20 mg by mouth as needed (Reflux).    [provider]      Allergies    Lansoprazole, Statins, Empagliflozin, Enalapril, and Niacin and related    Review of Systems   Review of Systems  Physical Exam Updated Vital Signs BP (!) 109/58   Pulse 72   Temp 97.8 F (36.6 C) (Oral)   Resp 19   Ht 1.651 m (5\' 5" )   Wt 63.5 kg   SpO2 99%   BMI 23.30 kg/m  Physical Exam Vitals and nursing note reviewed.  Constitutional:      Appearance: She is well-developed. She is not diaphoretic.  HENT:     Head: Normocephalic and atraumatic.  Right Ear: External ear normal.     Left Ear: External ear normal.  Eyes:     General: No scleral icterus.       Right eye: No discharge.        Left eye: No discharge.     Conjunctiva/sclera: Conjunctivae normal.  Neck:     Trachea: No tracheal deviation.  Cardiovascular:     Rate and Rhythm: Regular rhythm. Tachycardia present.  Pulmonary:     Effort: Pulmonary effort is normal. No respiratory distress.     Breath sounds: Normal breath sounds. No stridor. No wheezing or rales.  Abdominal:     General: Bowel sounds are normal. There is no distension.     Palpations: Abdomen is soft.     Tenderness: There is no abdominal tenderness. There is no guarding or rebound.  Musculoskeletal:         General: No tenderness or deformity.     Cervical back: Neck supple.  Skin:    General: Skin is warm and dry.     Findings: No rash.  Neurological:     General: No focal deficit present.     Mental Status: She is alert.     Cranial Nerves: No cranial nerve deficit, dysarthria or facial asymmetry.     Sensory: No sensory deficit.     Motor: No abnormal muscle tone or seizure activity.     Coordination: Coordination normal.  Psychiatric:        Mood and Affect: Mood normal.     ED Results / Procedures / Treatments   Labs (all labs ordered are listed, but only abnormal results are displayed) Labs Reviewed  BASIC METABOLIC PANEL - Abnormal; Notable for the following components:      Result Value   CO2 20 (*)    Glucose, Bld 187 (*)    All other components within normal limits  CBC - Abnormal; Notable for the following components:   WBC 13.7 (*)    MCH 25.7 (*)    Platelets 464 (*)    All other components within normal limits  TROPONIN I (HIGH SENSITIVITY) - Abnormal; Notable for the following components:   Troponin I (High Sensitivity) 94 (*)    All other components within normal limits  TROPONIN I (HIGH SENSITIVITY) - Abnormal; Notable for the following components:   Troponin I (High Sensitivity) 140 (*)    All other components within normal limits  MAGNESIUM    EKG EKG Interpretation Date/Time:  Wednesday November 19 2022 16:57:43 EDT Ventricular Rate:  167 PR Interval:    QRS Duration:  118 QT Interval:  290 QTC Calculation: 483 R Axis:   211  Text Interpretation: Undetermined rhythm Right bundle branch block Anterolateral infarct , age undetermined Abnormal ECG When compared with ECG of 14-Nov-2022 13:32, Since last tracing rate faster Confirmed by Linwood Dibbles [phone]) on 11/19/2022 5:03:59 PM  Radiology DG Chest Portable 1 View  Result Date: 11/19/2022 CLINICAL DATA:  Tachycardia EXAM: PORTABLE CHEST 1 VIEW COMPARISON:  CT chest dated 10/10/2022 FINDINGS: Lungs are  clear.  No pleural effusion or pneumothorax. The heart normal in size.  Thoracic aortic atherosclerosis. Defibrillator pads overlying the lower chest. IMPRESSION: No acute cardiopulmonary disease. Electronically Signed   By: Charline Bills M.D.   On: 11/19/2022 17:58    Procedures Procedures    Medications Ordered in ED Medications  sodium chloride 0.9 % bolus 500 mL (0 mLs Intravenous Stopped 11/19/22 1836)    Followed by  0.9 %  sodium chloride infusion (1,000 mLs Intravenous New Bag/Given 11/19/22 1836)  aspirin chewable tablet 324 mg (324 mg Oral Given 11/19/22 1800)  metoprolol tartrate (LOPRESSOR) injection 5 mg (5 mg Intravenous Given 11/19/22 1806)    ED Course/ Medical Decision Making/ A&P Clinical Course as of 11/19/22 2116  Wed Nov 19, 2022  1713 Heart rate now down into the 100s at the bedside [JK]  1727 Reviewed case with Dr Tresa Endo.  Initial Bigeminy rate of 167.  Recommends dose of beta blocker [JK]  2006 Increaase dilt from 240 to 360, D/w Dr Anne Fu [JK]    Clinical Course User Index [JK] Linwood Dibbles, MD                                 Medical Decision Making Problems Addressed: Bigeminy: acute illness or injury that poses a threat to life or bodily functions Palpitations: acute illness or injury that poses a threat to life or bodily functions Tachycardia: acute illness or injury that poses a threat to life or bodily functions  Amount and/or Complexity of Data Reviewed Labs: ordered. Decision-making details documented in ED Course. Radiology: ordered and independent interpretation performed.  Risk OTC drugs. Prescription drug management.   Patient presented to the ED for evaluation of tachycardia.  Patient initially had heart rate in the 160s.  Appeared to be in atrial bigeminy.  Patient spontaneously converted back to sinus rhythm.  Heart rate down into the 100s.  Patient was monitored in the ED for several hours.  No recurrent tachycardia.  Case discussed with  Dr. Tresa Endo and Dr. Anne Fu.  Elevation troponins noted but consistent with her tachycardic.  Patient is not have any chest pain or discomfort at this time to suggest acute coronary syndrome.  Will have patient increase her Cardizem to 300 mg as her blood pressures have been low normal stable for discharge and close outpatient cardiology follow-up        Final Clinical Impression(s) / ED Diagnoses Final diagnoses:  Bigeminy  Palpitations  Tachycardia    Rx / DC Orders ED Discharge Orders          Ordered    diltiazem (CARDIZEM CD) 300 MG 24 hr capsule  Daily        11/19/22 2113              Linwood Dibbles, MD 11/19/22 2116

## 2022-11-19 NOTE — Discharge Instructions (Signed)
Increase your diltiazem to 300 mg daily.  I have written a new prescription for the increased dose.  Follow-up with your cardiologist for further evaluation as we discussed.  Return to the ED as needed for any recurrent episodes.

## 2022-11-19 NOTE — ED Triage Notes (Signed)
Pt reports feeling like her heart is beating irregularly  since 11am, hx of SVT and states this feels the same. HR 160 in triage. Denies CP/SOB.  Attempted valsalva maneuver in triage without success.

## 2022-11-25 DIAGNOSIS — D519 Vitamin B12 deficiency anemia, unspecified: Secondary | ICD-10-CM | POA: Diagnosis not present

## 2022-12-01 NOTE — Progress Notes (Unsigned)
Cardiology Office Note:  .   Date:  12/02/2022  ID:  [name], Castro Dec 02, 1940, MRN [id number] PCP: Physicians, [facility] Providers Cardiologist:  Gypsy Balsam, MD    History of Present Illness: .   Robin Castro is a 82 y.o. female with a past medical history of PSVT, bigeminy, PVD, GERD, DM 2, uterine prolapse, hyperlipidemia, palpitations, dyslipidemia.  05/15/2021 echo EF 60 to 65%, mild LVH, grade 1 DD 02/01/2021 carotid ultrasound left ICA without stenosis, right ICA 40 to 59% stenosis  Evaluated by Dr. Bing Matter on 11/14/2022, she was bothered by palpitations, blood work was unrevealing, the decision was made to wear a monitor, which had been started but not been completed yet.  Evaluated at [facility] emergency department 11/19/2022 for evaluation of rapid heartbeat, she had been seen in our office approximately 5 days for the same, states she also had some lightheadedness and chest discomfort.  She was noted to be in bigeminy, discussion occurred with cardiology and her diltiazem was increased to 300 mg daily.  Magnesium was 1.7, CBC revealed WBCs 13.7, troponin was elevated at 94 but this was felt to be secondary to tachycardia and she was without chest pain, she was ultimately discharged home and advised to follow-up with cardiology.  She presents today accompanied by her daughter-in-law for follow-up after recent ED visit as outlined above.  She finished wearing her monitor just recently mailed it last week, results are not available for review yet.  She has not had any recurrent episodes of palpitation since her ED visit.  She does notice that she has had some DOE when she goes walking with her group of friends, so much so that one of her friends commented on it. She denies chest pain,  dyspnea, pnd, orthopnea, n, v, dizziness, syncope, edema, weight gain, or early satiety.    ROS: Review of Systems  Constitutional: Negative.   HENT: Negative.     Eyes: Negative.   Respiratory:  Positive for shortness of breath.   Cardiovascular:  Positive for palpitations.  Gastrointestinal: Negative.   Genitourinary: Negative.   Musculoskeletal: Negative.   Skin: Negative.   Neurological: Negative.   Endo/Heme/Allergies: Negative.   Psychiatric/Behavioral: Negative.      Studies Reviewed: Marland Kitchen   EKG Interpretation Date/Time:  Tuesday December 02 2022 08:22:21 EDT Ventricular Rate:  58 PR Interval:  166 QRS Duration:  72 QT Interval:  424 QTC Calculation: 416 R Axis:   -61  Text Interpretation: Sinus bradycardia Left axis deviation Low voltage QRS Unchanged EKG Possible Anterolateral infarct , age undetermined Abnormal ECG When compared with ECG of 19-Nov-2022 17:09, PREVIOUS ECG IS PRESENT Confirmed by Wallis Bamberg ([id number]) on 12/02/2022 9:53:16 AM   Cardiac Studies & Procedures       ECHOCARDIOGRAM  ECHOCARDIOGRAM COMPLETE 05/15/2021  Narrative ECHOCARDIOGRAM REPORT    Patient Name:   Robin Castro Date of Exam: 05/15/2021 Medical Rec #:  [id number]         Height:       65.0 in Accession #:    [id number]        Weight:       143.0 lb Date of Birth:  Jul 27, 1940         BSA:          1.715 m Patient Age:    80 years          BP:  130/78 mmHg Patient Gender: F                 HR:           62 bpm. Exam Location:  [place]  Procedure: 2D Echo, Cardiac Doppler, Color Doppler and Strain Analysis  Indications:    Paroxysmal SVT (supraventricular tachycardia) (HCC) [I47.1 (ICD-10-CM)]; Peripheral vascular disease, unspecified (HCC) up to 60% right internal carotid artery [I73.9 (ICD-10-CM)]; Gastroesophageal reflux disease, unspecified whether esophagitis present [K21.9 (ICD-10-CM)]; Hypercholesteremia [E78.00 (ICD-10-CM)]  History:        Patient has prior history of Echocardiogram examinations, most recent 06/10/2018. Arrythmias:Tachycardia; Risk Factors:Dyslipidemia.  Sonographer:    Margreta Journey RDCS Referring  Phys: [id number] ROBERT J KRASOWSKI  IMPRESSIONS   1. Left ventricular ejection fraction, by estimation, is 60 to 65%. The left ventricle has normal function. The left ventricle has no regional wall motion abnormalities. There is mild concentric left ventricular hypertrophy. Left ventricular diastolic parameters are consistent with Grade I diastolic dysfunction (impaired relaxation). The average left ventricular global longitudinal strain is -17.1 %. 2. Right ventricular systolic function is normal. The right ventricular size is normal. 3. The mitral valve is degenerative. No evidence of mitral valve regurgitation. No evidence of mitral stenosis. 4. The aortic valve is tricuspid. Aortic valve regurgitation is not visualized. No aortic stenosis is present. 5. The inferior vena cava is normal in size with greater than 50% respiratory variability, suggesting right atrial pressure of 3 mmHg.  FINDINGS Left Ventricle: Left ventricular ejection fraction, by estimation, is 60 to 65%. The left ventricle has normal function. The left ventricle has no regional wall motion abnormalities. The average left ventricular global longitudinal strain is -17.1 %. The left ventricular internal cavity size was normal in size. There is mild concentric left ventricular hypertrophy. Left ventricular diastolic parameters are consistent with Grade I diastolic dysfunction (impaired relaxation). Normal left ventricular filling pressure.  Right Ventricle: The right ventricular size is normal. No increase in right ventricular wall thickness. Right ventricular systolic function is normal.  Left Atrium: Left atrial size was normal in size.  Right Atrium: Right atrial size was normal in size.  Pericardium: There is no evidence of pericardial effusion.  Mitral Valve: The mitral valve is degenerative in appearance. Mild mitral annular calcification. No evidence of mitral valve regurgitation. No evidence of mitral valve  stenosis.  Tricuspid Valve: The tricuspid valve is normal in structure. Tricuspid valve regurgitation is not demonstrated. No evidence of tricuspid stenosis.  Aortic Valve: The aortic valve is tricuspid. Aortic valve regurgitation is not visualized. No aortic stenosis is present. Aortic valve mean gradient measures 6.0 mmHg. Aortic valve peak gradient measures 11.3 mmHg. Aortic valve area, by VTI measures 2.00 cm.  Pulmonic Valve: The pulmonic valve was normal in structure. Pulmonic valve regurgitation is not visualized. No evidence of pulmonic stenosis.  Aorta: The aortic root, ascending aorta, aortic arch and descending aorta are all structurally normal, with no evidence of dilitation or obstruction.  Venous: A normal flow pattern is recorded from the right upper pulmonary vein. The inferior vena cava is normal in size with greater than 50% respiratory variability, suggesting right atrial pressure of 3 mmHg.  IAS/Shunts: No atrial level shunt detected by color flow Doppler.   LEFT VENTRICLE PLAX 2D LVIDd:         4.30 cm   Diastology LVIDs:         2.90 cm   LV e' medial:    7.62 cm/s LV PW:  1.00 cm   LV E/e' medial:  11.0 LV IVS:        1.00 cm   LV e' lateral:   9.90 cm/s LVOT diam:     1.90 cm   LV E/e' lateral: 8.5 LV SV:         67 LV SV Index:   39        2D Longitudinal Strain LVOT Area:     2.84 cm  2D Strain GLS Avg:     -17.1 %   RIGHT VENTRICLE             IVC RV Basal diam:  2.70 cm     IVC diam: 1.30 cm RV S prime:     10.80 cm/s TAPSE (M-mode): 2.6 cm  LEFT ATRIUM             Index        RIGHT ATRIUM           Index LA diam:        3.50 cm 2.04 cm/m   RA Area:     13.20 cm LA Vol (A2C):   36.7 ml 21.40 ml/m  RA Volume:   27.30 ml  15.92 ml/m LA Vol (A4C):   34.0 ml 19.82 ml/m LA Biplane Vol: 36.7 ml 21.40 ml/m AORTIC VALVE AV Area (Vmax):    1.70 cm AV Area (Vmean):   1.80 cm AV Area (VTI):     2.00 cm AV Vmax:           168.00 cm/s AV  Vmean:          113.000 cm/s AV VTI:            0.336 m AV Peak Grad:      11.3 mmHg AV Mean Grad:      6.0 mmHg LVOT Vmax:         101.00 cm/s LVOT Vmean:        71.800 cm/s LVOT VTI:          0.237 m LVOT/AV VTI ratio: 0.71  AORTA Ao Root diam: 3.00 cm Ao Asc diam:  3.40 cm  MITRAL VALVE MV Area (PHT): 2.34 cm    SHUNTS MV Decel Time: 324 msec    Systemic VTI:  0.24 m MV E velocity: 84.00 cm/s  Systemic Diam: 1.90 cm MV A velocity: 97.30 cm/s MV E/A ratio:  0.86  Norman Herrlich MD Electronically signed by Norman Herrlich MD Signature Date/Time: 05/15/2021/12:39:16 PM    Final             Risk Assessment/Calculations:             Physical Exam:   VS:  BP 118/80 (BP Location: Right Arm, Patient Position: Sitting, Cuff Size: Normal)   Pulse (!) 58   Ht 5\' 5"  (1.651 m)   Wt 141 lb 3.2 oz (64 kg)   SpO2 99%   BMI 23.50 kg/m    Wt Readings from Last 3 Encounters:  12/02/22 141 lb 3.2 oz (64 kg)  11/19/22 140 lb (63.5 kg)  11/14/22 140 lb 9.6 oz (63.8 kg)    GEN: Well nourished, well developed in no acute distress NECK: No JVD; No carotid bruits CARDIAC: RRR, no murmurs, rubs, gallops RESPIRATORY:  Clear to auscultation without rales, wheezing or rhonchi  ABDOMEN: Soft, non-tender, non-distended EXTREMITIES:  No edema; No deformity   ASSESSMENT AND PLAN: .   Bigeminy/PSVT-currently quiescent, continue diltiazem 300 mg daily, may take Cardizem  30 mg as needed as needed for palpitations.  Awaiting results of recent monitor, if she has a high PVC burden, plan to refer to EP.  Will check TSH today, other lab work including magnesium has been WNL.  DOE-this could be an anginal equivalent, troponins were elevated when she was in the emergency department however she was asymptomatic.  Will arrange for exercise stress evaluation.  Continue aspirin 81 mg daily.  Hyperlipidemia-most recent LDL was elevated at 119, currently only on Zetia 10 mg daily, continue fenofibrate 145 mg  daily, not able to tolerate Crestor.  Will address at next OV.   Carotid artery stenosis-left ICA without stenosis, right ICA 40 to 59% stenosis in 2022. Discuss repeat carotid US at next OV.   Hypertension-blood pressures actually been relatively low at home since her Cardizem was increased, will decrease her losartan to 12.5 mg daily.    Informed Consent   Shared Decision Making/Informed Consent The risks [chest pain, shortness of breath, cardiac arrhythmias, dizziness, blood pressure fluctuations, myocardial infarction, stroke/transient ischemic attack, nausea, vomiting, allergic reaction, radiation exposure, metallic taste sensation and life-threatening complications (estimated to be 1 in 10,000)], benefits (risk stratification, diagnosing coronary artery disease, treatment guidance) and alternatives of a nuclear stress test were discussed in detail with Ms. Agostinelli and she agrees to proceed.     Dispo: Exercise stress evaluation, repeat CBC, TSH, decrease losartan to 12.5 mg daily.   Signed, Flossie Dibble, NP

## 2022-12-02 ENCOUNTER — Encounter: Payer: Self-pay | Admitting: Cardiology

## 2022-12-02 ENCOUNTER — Ambulatory Visit: Payer: PPO | Attending: Cardiology | Admitting: Cardiology

## 2022-12-02 VITALS — BP 118/80 | HR 58 | Ht 65.0 in | Wt 141.2 lb

## 2022-12-02 DIAGNOSIS — I6529 Occlusion and stenosis of unspecified carotid artery: Secondary | ICD-10-CM

## 2022-12-02 DIAGNOSIS — I739 Peripheral vascular disease, unspecified: Secondary | ICD-10-CM

## 2022-12-02 DIAGNOSIS — E782 Mixed hyperlipidemia: Secondary | ICD-10-CM | POA: Diagnosis not present

## 2022-12-02 DIAGNOSIS — Z79899 Other long term (current) drug therapy: Secondary | ICD-10-CM | POA: Diagnosis not present

## 2022-12-02 DIAGNOSIS — R0989 Other specified symptoms and signs involving the circulatory and respiratory systems: Secondary | ICD-10-CM | POA: Diagnosis not present

## 2022-12-02 DIAGNOSIS — R0609 Other forms of dyspnea: Secondary | ICD-10-CM

## 2022-12-02 DIAGNOSIS — I498 Other specified cardiac arrhythmias: Secondary | ICD-10-CM

## 2022-12-02 DIAGNOSIS — I471 Supraventricular tachycardia, unspecified: Secondary | ICD-10-CM | POA: Diagnosis not present

## 2022-12-02 MED ORDER — DILTIAZEM HCL 30 MG PO TABS: ORAL_TABLET | ORAL | 6 refills | Status: DC

## 2022-12-02 NOTE — Patient Instructions (Signed)
Medication Instructions:  Your physician recommends that you continue on your current medications as directed. Please refer to the Current Medication list given to you today.  *If you need a refill on your cardiac medications before your next appointment, please call your pharmacy*   Lab Work: Your physician recommends that you return for lab work in: Today for CBC and TSH  If you have labs (blood work) drawn today and your tests are completely normal, you will receive your results only by: MyChart Message (if you have MyChart) OR A paper copy in the mail If you have any lab test that is abnormal or we need to change your treatment, we will call you to review the results.   Testing/Procedures: Your physician has requested that you have a lexiscan myoview. For further information please visit [email or web address] Please follow instruction sheet, as given.   The test will take approximately 3 to 4 hours to complete; you may bring reading material.  If someone comes with you to your appointment, they will need to remain in the main lobby due to limited space in the testing area.  How to prepare for your Myocardial Perfusion Test:            Do not eat or drink 3 hours prior to your test, except you may have water. Do not consume products containing caffeine (regular or decaffeinated) 12 hours prior to your test. (ex: coffee, chocolate, sodas, tea). Do bring a list of your current medications with you.  If not listed below, you may take your medications as normal. Do wear comfortable clothes (no dresses or overalls) and walking shoes, tennis shoes preferred (No heels or open toe shoes are allowed). Do NOT wear cologne, perfume, aftershave, or lotions (deodorant is allowed). If these instructions are not followed, your test will have to be rescheduled.        Follow-Up: At [facility], you and your health needs are our priority.  As part of our continuing mission to provide you  with exceptional heart care, we have created designated Provider Care Teams.  These Care Teams include your primary Cardiologist (physician) and Advanced Practice Providers (APPs -  Physician Assistants and Nurse Practitioners) who all work together to provide you with the care you need, when you need it.  We recommend signing up for the patient portal called "MyChart".  Sign up information is provided on this After Visit Summary.  MyChart is used to connect with patients for Virtual Visits (Telemedicine).  Patients are able to view lab/test results, encounter notes, upcoming appointments, etc.  Non-urgent messages can be sent to your provider as well.   To learn more about what you can do with MyChart, go to [email or web address].    Your next appointment:  September 16th at 1:00     Provider:   Gypsy Balsam, MD    Other Instructions

## 2022-12-03 DIAGNOSIS — E559 Vitamin D deficiency, unspecified: Secondary | ICD-10-CM | POA: Diagnosis not present

## 2022-12-03 DIAGNOSIS — E119 Type 2 diabetes mellitus without complications: Secondary | ICD-10-CM | POA: Diagnosis not present

## 2022-12-03 DIAGNOSIS — D649 Anemia, unspecified: Secondary | ICD-10-CM | POA: Diagnosis not present

## 2022-12-03 DIAGNOSIS — G4700 Insomnia, unspecified: Secondary | ICD-10-CM | POA: Diagnosis not present

## 2022-12-03 DIAGNOSIS — I10 Essential (primary) hypertension: Secondary | ICD-10-CM | POA: Diagnosis not present

## 2022-12-03 LAB — TSH: TSH: 5.04 u[IU]/mL — ABNORMAL HIGH (ref 0.450–4.500)

## 2022-12-03 LAB — CBC WITH DIFFERENTIAL/PLATELET
Basophils Absolute: 0.1 x10E3/uL (ref 0.0–0.2)
Basos: 1 %
EOS (ABSOLUTE): 0.2 x10E3/uL (ref 0.0–0.4)
Eos: 3 %
Hematocrit: 35.9 % (ref 34.0–46.6)
Hemoglobin: 11.2 g/dL (ref 11.1–15.9)
Immature Grans (Abs): 0 x10E3/uL (ref 0.0–0.1)
Immature Granulocytes: 0 %
Lymphocytes Absolute: 2.5 x10E3/uL (ref 0.7–3.1)
Lymphs: 34 %
MCH: 25.5 pg — ABNORMAL LOW (ref 26.6–33.0)
MCHC: 31.2 g/dL — ABNORMAL LOW (ref 31.5–35.7)
MCV: 82 fL (ref 79–97)
Monocytes Absolute: 0.7 x10E3/uL (ref 0.1–0.9)
Monocytes: 10 %
Neutrophils Absolute: 3.9 x10E3/uL (ref 1.4–7.0)
Neutrophils: 52 %
Platelets: 481 x10E3/uL — ABNORMAL HIGH (ref 150–450)
RBC: 4.4 x10E6/uL (ref 3.77–5.28)
RDW: 13.6 % (ref 11.7–15.4)
WBC: 7.4 x10E3/uL (ref 3.4–10.8)

## 2022-12-04 ENCOUNTER — Ambulatory Visit: Payer: PPO | Attending: Cardiology

## 2022-12-04 DIAGNOSIS — E782 Mixed hyperlipidemia: Secondary | ICD-10-CM | POA: Diagnosis not present

## 2022-12-04 DIAGNOSIS — I739 Peripheral vascular disease, unspecified: Secondary | ICD-10-CM

## 2022-12-04 DIAGNOSIS — I498 Other specified cardiac arrhythmias: Secondary | ICD-10-CM

## 2022-12-04 DIAGNOSIS — R55 Syncope and collapse: Secondary | ICD-10-CM | POA: Diagnosis not present

## 2022-12-04 DIAGNOSIS — I471 Supraventricular tachycardia, unspecified: Secondary | ICD-10-CM | POA: Diagnosis not present

## 2022-12-04 MED ORDER — TECHNETIUM TC 99M TETROFOSMIN IV KIT
29.7000 | PACK | Freq: Once | INTRAVENOUS | Status: AC | PRN
  Administered 2022-12-04: 29.7 via INTRAVENOUS

## 2022-12-04 MED ORDER — TECHNETIUM TC 99M TETROFOSMIN IV KIT
10.8000 | PACK | Freq: Once | INTRAVENOUS | Status: AC | PRN
  Administered 2022-12-04: 10.8 via INTRAVENOUS

## 2022-12-05 ENCOUNTER — Telehealth: Payer: Self-pay

## 2022-12-05 ENCOUNTER — Telehealth: Payer: Self-pay | Admitting: Cardiology

## 2022-12-05 LAB — MYOCARDIAL PERFUSION IMAGING
Angina Index: 0
Duke Treadmill Score: 7
Estimated workload: 7
Exercise duration (min): 7 min
Exercise duration (sec): 1 s
LV dias vol: 75 mL (ref 46–106)
LV sys vol: 30 mL
MPHR: 140 {beats}/min
Nuc Stress EF: 60 %
Peak HR: 121 {beats}/min
Percent HR: 86 %
Rest HR: 50 {beats}/min
Rest Nuclear Isotope Dose: 10.8 mCi
SDS: 0
SRS: 0
SSS: 0
ST Depression (mm): 0 mm
Stress Nuclear Isotope Dose: 29.7 mCi
TID: 1.03

## 2022-12-05 NOTE — Telephone Encounter (Signed)
Jocelyn with [employer] calling with abnormal monitor results

## 2022-12-05 NOTE — Telephone Encounter (Signed)
Spoke with pt regarding Monitor and Diltiazem. She was put on Diltiazem 300mg  and Jennifer added 30mg  as needed recently and she has not had any symptoms. She will continue on this dose, per Dr. Bing Matter and call of she has any problems or concerns. Routed to PCP.

## 2022-12-05 NOTE — Telephone Encounter (Signed)
Dr. Bing Matter had already resulted monitor. Pt called.

## 2022-12-09 DIAGNOSIS — D649 Anemia, unspecified: Secondary | ICD-10-CM | POA: Diagnosis not present

## 2023-01-13 DIAGNOSIS — R8290 Unspecified abnormal findings in urine: Secondary | ICD-10-CM | POA: Diagnosis not present

## 2023-01-13 DIAGNOSIS — M542 Cervicalgia: Secondary | ICD-10-CM | POA: Diagnosis not present

## 2023-01-13 DIAGNOSIS — Z6823 Body mass index (BMI) 23.0-23.9, adult: Secondary | ICD-10-CM | POA: Diagnosis not present

## 2023-01-16 ENCOUNTER — Other Ambulatory Visit: Payer: Self-pay | Admitting: Cardiology

## 2023-01-20 ENCOUNTER — Encounter: Payer: Self-pay | Admitting: Cardiology

## 2023-01-21 ENCOUNTER — Encounter: Payer: Self-pay | Admitting: Cardiology

## 2023-01-21 ENCOUNTER — Ambulatory Visit: Payer: PPO | Attending: Cardiology | Admitting: Cardiology

## 2023-01-21 VITALS — BP 134/60 | HR 64 | Ht 65.0 in | Wt 143.2 lb

## 2023-01-21 DIAGNOSIS — R0789 Other chest pain: Secondary | ICD-10-CM

## 2023-01-21 DIAGNOSIS — I471 Supraventricular tachycardia, unspecified: Secondary | ICD-10-CM

## 2023-01-21 DIAGNOSIS — R0989 Other specified symptoms and signs involving the circulatory and respiratory systems: Secondary | ICD-10-CM

## 2023-01-21 NOTE — Progress Notes (Signed)
Cardiology Office Note:    Date:  01/21/2023   ID:  Robin Castro Feb 02, 1941, MRN [id number]  PCP:  Annamaria Helling, DO  Cardiologist:  Gypsy Balsam, MD    Referring MD: Physicians, Cheryln Manly F*   Chief Complaint  Patient presents with   Follow-up    History of Present Illness:    Robin Castro is a 82 y.o. female with past medical history significant for supraventricular tachycardia recently having difficulty controlling it.  Eventually Cardizem has been increased to 300 mg daily and since that time arrhythmia seems to be controlled or additional problem include dyslipidemia peripheral vascular disease, diabetes, essential hypertension.  Comes today to months for follow-up overall doing very well.  After increasing dose of Cardizem asymptomatic denies have any chest pain tightness squeezing pressure burning chest.  Stress test reviewed with the patient was negative  Past Medical History:  Diagnosis Date   Chest discomfort    nl myoview 2010   Diabetes mellitus    type 2- x 12 years   Diabetes mellitus (HCC) 05/31/2018   Dysrhythmia    h/o rapid heart beat   GERD (gastroesophageal reflux disease)    History of cystocele 01/09/2020   Hypercholesteremia    Hyperlipidemia 10/21/2010   Left sided sciatica 05/08/2016   Other symptoms involving cardiovascular system 02/28/2009   Qualifier: Diagnosis of  By: Al-Rammal, RVT, RDCS, Missy     Pain in pelvis 01/09/2020   Palpitations    Paroxysmal SVT (supraventricular tachycardia) (HCC) 05/11/2018   Peripheral vascular disease, unspecified (HCC) up to 60% right internal carotid artery 07/05/2019   PONV (postoperative nausea and vomiting)    Postoperative state 04/19/2014   Pulsatile abdominal mass    neg U/S 2010   S/P TKR (total knee replacement) 09/30/2012   Second degree uterine prolapse 01/09/2020   SVD (spontaneous vaginal delivery)    x 2    Past Surgical History:  Procedure Laterality Date   APPENDECTOMY      BILATERAL SALPINGECTOMY  04/19/2014   Procedure: BILATERAL SALPINGECTOMY;  Surgeon: Loney Laurence, MD;  Location: [facility];  Service: Gynecology;;   BLADDER SUSPENSION N/A 04/19/2014   Procedure: TRANSVAGINAL TAPE (TVT) PROCEDURE;  Surgeon: Loney Laurence, MD;  Location: [facility];  Service: Gynecology;  Laterality: N/A;   BREAST BIOPSY Left    BREAST LUMPECTOMY WITH RADIOACTIVE SEED LOCALIZATION Left 06/06/2020   Procedure: LEFT BREAST LUMPECTOMY WITH RADIOACTIVE SEED LOCALIZATION;  Surgeon: Manus Rudd, MD;  Location: [facility];  Service: General;  Laterality: Left;   COLONOSCOPY     CYSTOCELE REPAIR N/A 04/19/2014   Procedure: ANTERIOR REPAIR (CYSTOCELE);  Surgeon: Loney Laurence, MD;  Location: [facility];  Service: Gynecology;  Laterality: N/A;   CYSTOSCOPY N/A 04/19/2014   Procedure: CYSTOSCOPY;  Surgeon: Loney Laurence, MD;  Location: [facility];  Service: Gynecology;  Laterality: N/A;   TOTAL KNEE ARTHROPLASTY     VAGINAL HYSTERECTOMY N/A 04/19/2014   Procedure: HYSTERECTOMY VAGINAL;  Surgeon: Loney Laurence, MD;  Location: [facility];  Service: Gynecology;  Laterality: N/A;    Current Medications: Current Meds  Medication Sig   aspirin 81 MG tablet Take 81 mg by mouth daily.   benzonatate (TESSALON) 200 MG capsule Take 200 mg by mouth 3 (three) times daily as needed for cough.   Calcium Carbonate-Vitamin D (CALCIUM 600 + D PO) Take 1 tablet by mouth 2 (two) times daily.   Coenzyme Q10 (CO Q 10 PO)  Take 100 mg by mouth daily.   cyanocobalamin (,VITAMIN B-12,) 1000 MCG/ML injection Inject 1,000 mcg into the muscle every 30 (thirty) days.   diazepam (VALIUM) 5 MG tablet Take 5 mg by mouth as needed for anxiety or sleep.   diltiazem (CARDIZEM CD) 300 MG 24 hr capsule Take 1 capsule (300 mg total) by mouth daily.   diltiazem (CARDIZEM) 30 MG tablet Take 1 tablet every 8 hours ONLY AS NEEDED for palpitations (Patient taking differently: Take 30 mg by mouth every 8  (eight) hours as needed (palpitations). Take 1 tablet every 8 hours ONLY AS NEEDED for palpitations)   ergocalciferol (VITAMIN D2) 50000 UNITS capsule Take 50,000 Units by mouth every 14 (fourteen) days.   ezetimibe (ZETIA) 10 MG tablet Take 1 tablet (10 mg total) by mouth daily.   fenofibrate (TRICOR) 145 MG tablet Take 1 tablet (145 mg total) by mouth daily.   losartan (COZAAR) 25 MG tablet Take 1 tablet (25 mg total) by mouth daily.   metFORMIN (GLUCOPHAGE) 850 MG tablet Take 850 mg by mouth 2 (two) times daily with a meal.   omeprazole (PRILOSEC) 20 MG capsule Take 20 mg by mouth as needed (Reflux).     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  Tobacco Use   Smoking status: Never   Smokeless tobacco: Never  Vaping Use   Vaping status: Never Used  Substance and Sexual Activity   Alcohol use: No   Drug use: No   Sexual activity: Yes    Birth control/protection: Post-menopausal  Other Topics Concern   Not on file  Social History Narrative   Not on file   Social Determinants of Health   Financial Resource Strain: Not on file  Food Insecurity: No Food Insecurity (09/24/2021)   Received from [facility], [facility]   Hunger Vital Sign    Worried About Running Out of Food in the Last Year: Never true    Ran Out of Food in the Last Year: Never true  Transportation Needs: Not on file  Physical Activity: Not on file  Stress: No Stress Concern Present (02/14/2021)   Received from [facility], [facility]   [employer] of [facility]ational [facility] - Occupational Stress Questionnaire    Feeling of Stress : Not at all  Social Connections: Unknown (08/09/2021)   Received from [facility], [facility]   Social Network    Social Network: Not on file     Family History: The patient's family history includes Diabetes in an other family member; Heart disease in an other family member; Hyperlipidemia in an other family member; Hypertension in an other family member. ROS:   Please see the history of present illness.    All 14 point review of systems negative except as described per history of present illness  EKGs/Labs/Other Studies Reviewed:         Recent Labs: 11/14/2022: ALT 13 11/19/2022: BUN 16; Creatinine, Ser 0.69; Magnesium 1.7; Potassium 3.5; Sodium 135 12/02/2022: Hemoglobin 11.2; Platelets 481; TSH 5.040  Recent Lipid Panel    Component Value Date/Time   CHOL 191 11/11/2022 0854   TRIG 94 11/11/2022 0854   HDL 55  11/11/2022 0854   CHOLHDL 3.5 11/11/2022 0854   LDLCALC 119 (H) 11/11/2022 0854    Physical Exam:    VS:  BP 134/60 (BP Location: Left Arm, Patient Position: Sitting)   Pulse 64   Ht 5\' 5"  (1.651 m)   Wt 143 lb 3.2 oz (65 kg)   SpO2 95%   BMI 23.83 kg/m     Wt Readings from Last 3 Encounters:  01/21/23 143 lb 3.2 oz (65 kg)  12/04/22 141 lb (64 kg)  12/02/22 141 lb 3.2 oz (64 kg)     GEN:  Well nourished, well developed in no acute distress HEENT: Normal NECK: No JVD; No carotid bruits LYMPHATICS: No lymphadenopathy CARDIAC: RRR, no murmurs, no rubs, no gallops RESPIRATORY:  Clear to auscultation without rales, wheezing or rhonchi  ABDOMEN: Soft, non-tender, non-distended MUSCULOSKELETAL:  No edema; No deformity  SKIN: Warm and dry LOWER EXTREMITIES: no swelling NEUROLOGIC:  Alert and oriented x 3 PSYCHIATRIC:  Normal affect   ASSESSMENT:    1. Paroxysmal SVT (supraventricular tachycardia) (HCC)   2. Chest discomfort    PLAN:    In order of problems listed above:  Supraventricular tachycardia.  Well-controlled continue present management until her options for this condition would be increasing dose of medication  or talking about ablation it became incessant. Peripheral vascular disease stable from that point review.  Will make arrangements for carotic ultrasound to be done. Diabetes mellitus followed by antimedicine team. Dyslipidemia he is taking Zetia as well as Tricor had difficulty tolerating statins.  I did review K PN which show me data from summer with total cholesterol 173 HDL 56 LDL 119.  Will continue discussion about potentially using additional medications to control cholesterol   Medication Adjustments/Labs and Tests Ordered: Current medicines are reviewed at length with the patient today.  Concerns regarding medicines are outlined above.  No orders of the defined types were placed in this encounter.  Medication changes: No orders of the defined types were placed in this encounter.   Signed, Georgeanna Lea, MD, [facility] 01/21/2023 1:12 PM    [facility]

## 2023-01-21 NOTE — Patient Instructions (Signed)
Medication Instructions:  Your physician recommends that you continue on your current medications as directed. Please refer to the Current Medication list given to you today.  *If you need a refill on your cardiac medications before your next appointment, please call your pharmacy*   Lab Work: NONE If you have labs (blood work) drawn today and your tests are completely normal, you will receive your results only by: MyChart Message (if you have MyChart) OR A paper copy in the mail If you have any lab test that is abnormal or we need to change your treatment, we will call you to review the results.   Testing/Procedures: NONE   Follow-Up: At [facility], you and your health needs are our priority.  As part of our continuing mission to provide you with exceptional heart care, we have created designated Provider Care Teams.  These Care Teams include your primary Cardiologist (physician) and Advanced Practice Providers (APPs -  Physician Assistants and Nurse Practitioners) who all work together to provide you with the care you need, when you need it.  We recommend signing up for the patient portal called "MyChart".  Sign up information is provided on this After Visit Summary.  MyChart is used to connect with patients for Virtual Visits (Telemedicine).  Patients are able to view lab/test results, encounter notes, upcoming appointments, etc.  Non-urgent messages can be sent to your provider as well.   To learn more about what you can do with MyChart, go to [email or web address].    Your next appointment:   6 month(s)  Provider:   Gypsy Balsam, MD    Other Instructions

## 2023-01-27 DIAGNOSIS — D510 Vitamin B12 deficiency anemia due to intrinsic factor deficiency: Secondary | ICD-10-CM | POA: Diagnosis not present

## 2023-02-03 DIAGNOSIS — R300 Dysuria: Secondary | ICD-10-CM | POA: Diagnosis not present

## 2023-02-10 DIAGNOSIS — Z23 Encounter for immunization: Secondary | ICD-10-CM | POA: Diagnosis not present

## 2023-02-12 ENCOUNTER — Ambulatory Visit: Payer: PPO | Attending: Cardiology

## 2023-02-12 DIAGNOSIS — R0989 Other specified symptoms and signs involving the circulatory and respiratory systems: Secondary | ICD-10-CM | POA: Diagnosis not present

## 2023-02-13 ENCOUNTER — Other Ambulatory Visit: Payer: Self-pay | Admitting: Cardiology

## 2023-02-17 DIAGNOSIS — M542 Cervicalgia: Secondary | ICD-10-CM | POA: Diagnosis not present

## 2023-02-17 DIAGNOSIS — Z6823 Body mass index (BMI) 23.0-23.9, adult: Secondary | ICD-10-CM | POA: Diagnosis not present

## 2023-02-17 DIAGNOSIS — N390 Urinary tract infection, site not specified: Secondary | ICD-10-CM | POA: Diagnosis not present

## 2023-02-20 ENCOUNTER — Telehealth: Payer: Self-pay

## 2023-02-20 ENCOUNTER — Other Ambulatory Visit: Payer: Self-pay

## 2023-02-20 MED ORDER — DILTIAZEM HCL ER COATED BEADS 300 MG PO CP24: 300.0000 mg | ORAL_CAPSULE | Freq: Every day | ORAL | 0 refills | Status: DC

## 2023-02-20 NOTE — Telephone Encounter (Signed)
Patient notified through my chart.

## 2023-02-20 NOTE — Telephone Encounter (Signed)
-----   Message from Gypsy Balsam sent at 02/13/2023  9:43 AM EST ----- Carotic ultrasound showed only minimal disease in the right side between 1 and 39%, medical therapy

## 2023-02-23 DIAGNOSIS — M542 Cervicalgia: Secondary | ICD-10-CM | POA: Diagnosis not present

## 2023-02-26 DIAGNOSIS — M542 Cervicalgia: Secondary | ICD-10-CM | POA: Diagnosis not present

## 2023-03-02 DIAGNOSIS — D510 Vitamin B12 deficiency anemia due to intrinsic factor deficiency: Secondary | ICD-10-CM | POA: Diagnosis not present

## 2023-03-02 DIAGNOSIS — M542 Cervicalgia: Secondary | ICD-10-CM | POA: Diagnosis not present

## 2023-03-04 DIAGNOSIS — M542 Cervicalgia: Secondary | ICD-10-CM | POA: Diagnosis not present

## 2023-03-09 DIAGNOSIS — M542 Cervicalgia: Secondary | ICD-10-CM | POA: Diagnosis not present

## 2023-03-11 DIAGNOSIS — N390 Urinary tract infection, site not specified: Secondary | ICD-10-CM | POA: Diagnosis not present

## 2023-03-11 DIAGNOSIS — E119 Type 2 diabetes mellitus without complications: Secondary | ICD-10-CM | POA: Diagnosis not present

## 2023-03-11 DIAGNOSIS — R319 Hematuria, unspecified: Secondary | ICD-10-CM | POA: Diagnosis not present

## 2023-03-11 DIAGNOSIS — N3000 Acute cystitis without hematuria: Secondary | ICD-10-CM | POA: Diagnosis not present

## 2023-03-11 DIAGNOSIS — R399 Unspecified symptoms and signs involving the genitourinary system: Secondary | ICD-10-CM | POA: Diagnosis not present

## 2023-03-11 DIAGNOSIS — N952 Postmenopausal atrophic vaginitis: Secondary | ICD-10-CM | POA: Diagnosis not present

## 2023-03-11 DIAGNOSIS — R8290 Unspecified abnormal findings in urine: Secondary | ICD-10-CM | POA: Diagnosis not present

## 2023-03-11 DIAGNOSIS — N200 Calculus of kidney: Secondary | ICD-10-CM | POA: Diagnosis not present

## 2023-03-11 DIAGNOSIS — N958 Other specified menopausal and perimenopausal disorders: Secondary | ICD-10-CM | POA: Diagnosis not present

## 2023-03-12 DIAGNOSIS — M542 Cervicalgia: Secondary | ICD-10-CM | POA: Diagnosis not present

## 2023-03-16 DIAGNOSIS — M542 Cervicalgia: Secondary | ICD-10-CM | POA: Diagnosis not present

## 2023-03-19 DIAGNOSIS — M542 Cervicalgia: Secondary | ICD-10-CM | POA: Diagnosis not present

## 2023-03-23 DIAGNOSIS — M542 Cervicalgia: Secondary | ICD-10-CM | POA: Diagnosis not present

## 2023-03-26 DIAGNOSIS — M542 Cervicalgia: Secondary | ICD-10-CM | POA: Diagnosis not present

## 2023-03-30 DIAGNOSIS — M542 Cervicalgia: Secondary | ICD-10-CM | POA: Diagnosis not present

## 2023-04-06 DIAGNOSIS — M542 Cervicalgia: Secondary | ICD-10-CM | POA: Diagnosis not present

## 2023-04-07 DIAGNOSIS — D519 Vitamin B12 deficiency anemia, unspecified: Secondary | ICD-10-CM | POA: Diagnosis not present

## 2023-05-13 ENCOUNTER — Other Ambulatory Visit: Payer: Self-pay | Admitting: Cardiology

## 2023-07-09 ENCOUNTER — Encounter: Payer: Self-pay | Admitting: Cardiology

## 2023-07-10 ENCOUNTER — Ambulatory Visit: Payer: PPO | Attending: Cardiology | Admitting: Cardiology

## 2023-07-10 ENCOUNTER — Encounter: Payer: Self-pay | Admitting: Cardiology

## 2023-07-10 VITALS — BP 120/60 | HR 78 | Ht 65.0 in | Wt 141.4 lb

## 2023-07-10 DIAGNOSIS — I739 Peripheral vascular disease, unspecified: Secondary | ICD-10-CM

## 2023-07-10 DIAGNOSIS — E7800 Pure hypercholesterolemia, unspecified: Secondary | ICD-10-CM | POA: Diagnosis not present

## 2023-07-10 DIAGNOSIS — I471 Supraventricular tachycardia, unspecified: Secondary | ICD-10-CM

## 2023-07-10 MED ORDER — ZETIA 10 MG PO TABS: 10.0000 mg | ORAL_TABLET | Freq: Every day | ORAL | 3 refills | Status: AC

## 2023-07-10 MED ORDER — CARDIZEM CD 300 MG PO CP24: 300.0000 mg | ORAL_CAPSULE | Freq: Every day | ORAL | 3 refills | Status: DC

## 2023-07-10 NOTE — Addendum Note (Signed)
 Addended by: Eleonore Chiquito on: 07/10/2023 11:08 AM   Modules accepted: Orders

## 2023-07-10 NOTE — Patient Instructions (Signed)

## 2023-07-10 NOTE — Progress Notes (Signed)
 Cardiology Office Note:    Date:  07/10/2023   ID:  Robin, Castro [birth year], MRN [id number]  PCP:  Annamaria Helling, DO  Cardiologist:  Gypsy Balsam, MD    Referring MD: Annamaria Helling, DO   No chief complaint on file.   History of Present Illness:    Robin Castro is a 83 y.o. female past medical history significant for supraventricular tachycardia recently she has had some difficulty controlling it but dose of Cardizem has been increased to 300 mg now she says she got very short brief episode of palpitations overall does not bother her much.  Denies have any chest pain tightness squeezing pressure burning chest.  She take Zetia however has no difficulty tolerating this medication, she did not have problem like this when she was taking brand-name Zetia.  She request to switch to brand-name.  Otherwise things are fine.  Past Medical History:  Diagnosis Date   Chest discomfort    nl myoview 2010   Diabetes mellitus    type 2- x 12 years   Diabetes mellitus (HCC) 05/31/2018   Dysrhythmia    h/o rapid heart beat   GERD (gastroesophageal reflux disease)    History of cystocele 01/09/2020   Hypercholesteremia    Hyperlipidemia 10/21/2010   Left sided sciatica 05/08/2016   Nephrolithiasis 10/15/2020   Formatting of this note might be different from the original.  Added automatically from request for surgery [id number]     Other symptoms involving cardiovascular system 02/28/2009   Qualifier: Diagnosis of  By: Al-Rammal, RVT, RDCS, Missy     Pain in pelvis 01/09/2020   Palpitations    Paroxysmal SVT (supraventricular tachycardia) (HCC) 05/11/2018   Peripheral vascular disease, unspecified (HCC) up to 60% right internal carotid artery 07/05/2019   PONV (postoperative nausea and vomiting)    Postoperative state 04/19/2014   Pulsatile abdominal mass    neg U/S 2010   S/P TKR (total knee replacement) 09/30/2012   Second degree uterine prolapse 01/09/2020   SVD  (spontaneous vaginal delivery)    x 2    Past Surgical History:  Procedure Laterality Date   APPENDECTOMY     BILATERAL SALPINGECTOMY  04/19/2014   Procedure: BILATERAL SALPINGECTOMY;  Surgeon: Loney Laurence, MD;  Location: [facility];  Service: Gynecology;;   BLADDER SUSPENSION N/A 04/19/2014   Procedure: TRANSVAGINAL TAPE (TVT) PROCEDURE;  Surgeon: Loney Laurence, MD;  Location: [facility];  Service: Gynecology;  Laterality: N/A;   BREAST BIOPSY Left    BREAST LUMPECTOMY WITH RADIOACTIVE SEED LOCALIZATION Left 06/06/2020   Procedure: LEFT BREAST LUMPECTOMY WITH RADIOACTIVE SEED LOCALIZATION;  Surgeon: Manus Rudd, MD;  Location: [facility];  Service: General;  Laterality: Left;   COLONOSCOPY     CYSTOCELE REPAIR N/A 04/19/2014   Procedure: ANTERIOR REPAIR (CYSTOCELE);  Surgeon: Loney Laurence, MD;  Location: [facility];  Service: Gynecology;  Laterality: N/A;   CYSTOSCOPY N/A 04/19/2014   Procedure: CYSTOSCOPY;  Surgeon: Loney Laurence, MD;  Location: [facility];  Service: Gynecology;  Laterality: N/A;   TOTAL KNEE ARTHROPLASTY     VAGINAL HYSTERECTOMY N/A 04/19/2014   Procedure: HYSTERECTOMY VAGINAL;  Surgeon: Loney Laurence, MD;  Location: [facility];  Service: Gynecology;  Laterality: N/A;    Current Medications: Current Meds  Medication Sig   aspirin 81 MG tablet Take 81 mg by mouth daily.   benzonatate (TESSALON) 200 MG capsule Take 200 mg by mouth 3 (three) times daily as  needed for cough.   Calcium Carbonate-Vitamin D (CALCIUM 600 + D PO) Take 1 tablet by mouth 2 (two) times daily.   CARTIA XT 300 MG 24 hr capsule Take 1 capsule by mouth once daily   Coenzyme Q10 (CO Q 10 PO) Take 100 mg by mouth daily.   cyanocobalamin (,VITAMIN B-12,) 1000 MCG/ML injection Inject 1,000 mcg into the muscle every 30 (thirty) days.   diazepam (VALIUM) 5 MG tablet Take 5 mg by mouth as needed for anxiety or sleep.   diltiazem (CARDIZEM) 30 MG tablet Take 1 tablet every 8 hours  ONLY AS NEEDED for palpitations (Patient taking differently: Take 30 mg by mouth every 8 (eight) hours as needed (palpitations). Take 1 tablet every 8 hours ONLY AS NEEDED for palpitations)   EQUATE STOOL SOFTENER 100 MG capsule Take 100 mg by mouth daily.   ergocalciferol (VITAMIN D2) 50000 UNITS capsule Take 50,000 Units by mouth every 14 (fourteen) days.   estradiol (ESTRACE) 0.1 MG/GM vaginal cream Place 1 Applicatorful vaginally daily.   ezetimibe (ZETIA) 10 MG tablet Take 1 tablet (10 mg total) by mouth daily.   Fe Fum-FA-B Cmp-C-Zn-Mg-Mn-Cu (FERROCITE PLUS) 106-1 MG TABS Take 1 tablet by mouth daily.   fenofibrate (TRICOR) 145 MG tablet Take 1 tablet by mouth once daily   losartan (COZAAR) 25 MG tablet Take 1 tablet (25 mg total) by mouth daily.   meloxicam (MOBIC) 15 MG tablet Take 15 mg by mouth daily.   metFORMIN (GLUCOPHAGE) 850 MG tablet Take 850 mg by mouth 2 (two) times daily with a meal.   omeprazole (PRILOSEC) 20 MG capsule Take 20 mg by mouth as needed (Reflux).   ONETOUCH ULTRA test strip 1 each by Other route as directed.     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  Tobacco Use   Smoking status: Never   Smokeless tobacco: Never  Vaping Use   Vaping status: Never Used  Substance and Sexual Activity   Alcohol use: No   Drug use: No   Sexual activity: Yes    Birth control/protection: Post-menopausal  Other Topics Concern   Not on file  Social History Narrative   Not on file   Social Drivers of Health   Financial Resource Strain: Not on file  Food Insecurity: No Food Insecurity (09/24/2021)   [facility], [facility]   Hunger Vital Sign    Worried  About Running Out of Food in the Last Year: Never true    Ran Out of Food in the Last Year: Never true  Transportation Needs: Not on file  Physical Activity: Not on file  Stress: No Stress Concern Present (02/14/2021)   Received from [facility], [facility]   [employer] [facility] Occupational [facility] - Occupational Stress Questionnaire    Feeling of Stress : Not at all  Social Connections: Unknown (08/09/2021)   Received from [facility], [facility]   Social Network    Social Network: Not on file     Family History: The patient's family history includes Diabetes in an other family member; Heart disease in an other family member; Hyperlipidemia in an other family member; Hypertension in  an other family member. ROS:   Please see the history of present illness.    All 14 point review of systems negative except as described per history of present illness  EKGs/Labs/Other Studies Reviewed:         Recent Labs: 11/14/2022: ALT 13 11/19/2022: BUN 16; Creatinine, Ser 0.69; Magnesium 1.7; Potassium 3.5; Sodium 135 12/02/2022: Hemoglobin 11.2; Platelets 481; TSH 5.040  Recent Lipid Panel    Component Value Date/Time   CHOL 191 11/11/2022 0854   TRIG 94 11/11/2022 0854   HDL 55 11/11/2022 0854   CHOLHDL 3.5 11/11/2022 0854   LDLCALC 119 (H) 11/11/2022 0854    Physical Exam:    VS:  BP 120/60   Pulse 78   Ht 5\' 5"  (1.651 m)   Wt 141 lb 6.4 oz (64.1 kg)   SpO2 95%   BMI 23.53 kg/m     Wt Readings from Last 3 Encounters:  07/10/23 141 lb 6.4 oz (64.1 kg)  01/21/23 143 lb 3.2 oz (65 kg)  12/04/22 141 lb (64 kg)     GEN:  Well nourished, well developed in no acute distress HEENT: Normal NECK: No JVD; No carotid bruits LYMPHATICS: No lymphadenopathy CARDIAC: RRR, no murmurs, no rubs, no gallops RESPIRATORY:  Clear to auscultation without rales, wheezing or rhonchi  ABDOMEN: Soft, non-tender, non-distended MUSCULOSKELETAL:  No edema; No deformity  SKIN: Warm and  dry LOWER EXTREMITIES: no swelling NEUROLOGIC:  Alert and oriented x 3 PSYCHIATRIC:  Normal affect   ASSESSMENT:    1. Paroxysmal SVT (supraventricular tachycardia) (HCC)   2. Peripheral vascular disease, unspecified (HCC) up to 60% right internal carotid artery   3. Hypercholesteremia    PLAN:    In order of problems listed above:  Paroxysmal supraventricular tachycardia will continue Cardizem CD 300 mg daily, Dyslipidemia will give her prescription for the brand Zetia as she requested.  She got intolerance to multiple medications we will recheck fasting lipid profile later. Diabetes mellitus followed by antimedicine team hemoglobin A1c from [facility] PN 6.6 from August of last year.    Medication Adjustments/Labs and Tests Ordered: Current medicines are reviewed at length with the patient today.  Concerns regarding medicines are outlined above.  No orders of the defined types were placed in this encounter.  Medication changes: No orders of the defined types were placed in this encounter.   Signed, Georgeanna Lea, MD, [facility] 07/10/2023 10:57 AM    [facility]

## 2023-07-20 ENCOUNTER — Telehealth: Payer: Self-pay | Admitting: Pharmacy Technician

## 2023-07-20 ENCOUNTER — Telehealth: Payer: Self-pay | Admitting: Cardiology

## 2023-07-20 ENCOUNTER — Other Ambulatory Visit (HOSPITAL_COMMUNITY): Payer: Self-pay

## 2023-07-20 NOTE — Telephone Encounter (Signed)
   I called [employer] and answered the questions over the phone and faxed chart notes req code: [phone]

## 2023-07-20 NOTE — Telephone Encounter (Signed)
 Pt c/o medication issue:  1. Name of Medication:   CARDIZEM CD 300 MG 24 hr capsule    ZETIA 10 MG tablet    2. How are you currently taking this medication (dosage and times per day)? N/A  3. Are you having a reaction (difficulty breathing--STAT)? No  4. What is your medication issue? Prior Auth needed for these medications

## 2023-07-20 NOTE — Telephone Encounter (Signed)
 I called [employer] and answered the questions over the phone and faxed chart notes req code: [phone]

## 2023-07-22 ENCOUNTER — Other Ambulatory Visit (HOSPITAL_COMMUNITY): Payer: Self-pay

## 2023-07-22 NOTE — Telephone Encounter (Signed)
 Pharmacy Patient Advocate Encounter  Received notification from [employer] that Prior Authorization for CARDIZEM has been APPROVED from 07/21/23 to 04/06/24. Unable to obtain price due to refill too soon rejection, last fill date 05/15/23 next available fill date4/22/25 . Pharmacy said they will have this ordered for that date

## 2023-07-22 NOTE — Telephone Encounter (Signed)
 Pharmacy Patient Advocate Encounter  Received notification from [employer] that Prior Authorization for zetia has been APPROVED from 07/21/23 to 04/06/24. Spoke to pharmacy to process.Copay is $250.00 for 30 days.

## 2023-09-03 ENCOUNTER — Ambulatory Visit (HOSPITAL_BASED_OUTPATIENT_CLINIC_OR_DEPARTMENT_OTHER): Admitting: Family Medicine

## 2023-09-03 ENCOUNTER — Encounter (HOSPITAL_BASED_OUTPATIENT_CLINIC_OR_DEPARTMENT_OTHER): Payer: Self-pay | Admitting: Family Medicine

## 2023-09-03 VITALS — BP 123/78 | HR 79 | Temp 98.6°F | Resp 16 | Ht 63.78 in | Wt 138.7 lb

## 2023-09-03 DIAGNOSIS — N8110 Cystocele, unspecified: Secondary | ICD-10-CM | POA: Insufficient documentation

## 2023-09-03 DIAGNOSIS — M542 Cervicalgia: Secondary | ICD-10-CM | POA: Diagnosis not present

## 2023-09-03 DIAGNOSIS — E782 Mixed hyperlipidemia: Secondary | ICD-10-CM | POA: Diagnosis not present

## 2023-09-03 DIAGNOSIS — I471 Supraventricular tachycardia, unspecified: Secondary | ICD-10-CM | POA: Diagnosis not present

## 2023-09-03 DIAGNOSIS — G8929 Other chronic pain: Secondary | ICD-10-CM | POA: Diagnosis not present

## 2023-09-03 NOTE — Progress Notes (Signed)
 Established Patient Office Visit  Subjective   Patient ID: Robin Castro, female    DOB: 1940/06/06  Age: 83 y.o. MRN: [id number]  Chief Complaint  Patient presents with   Establish Care    Pt. Here to establish care.    Neck Pain    Pt. C/o neck and head pain that's been going on since a year. Pt. Stats that the pain is a 7.     F/u as above.  Well known to me from [facility].  Extended discussion.  Continues to do a fine job with her diet and exercise.  She is frustrated with chronic neck and headache pain.  Already evaluated by [facility] who reportedly didn't find much on imaging.  She states she did not improve on PT or massage therapy.    Neck Pain  Pertinent negatives include no chest pain, fever or weight loss.    Past Medical History:  Diagnosis Date   Alopecia    f/by [employer].   Diabetes mellitus    type 2- x 12 years   GERD (gastroesophageal reflux disease)    History of cystocele 01/09/2020   Hyperlipidemia 10/21/2010   Nephrolithiasis 10/15/2020   f/by Urology in [place]   Paroxysmal SVT (supraventricular tachycardia) (HCC) 05/11/2018   f/by Dr. Gordan Latina   Peripheral vascular disease, unspecified (HCC) up to 60% right internal carotid artery 07/05/2019   Second degree uterine prolapse 01/09/2020    Outpatient Encounter Medications as of 09/03/2023  Medication Sig   aspirin  81 MG tablet Take 81 mg by mouth daily.   Calcium  Carbonate-Vitamin D (CALCIUM  600 + D PO) Take 1 tablet by mouth 2 (two) times daily.   CARDIZEM  CD 300 MG 24 hr capsule Take 1 capsule (300 mg total) by mouth daily.   ergocalciferol (VITAMIN D2) 50000 UNITS capsule Take 50,000 Units by mouth every 14 (fourteen) days.   estradiol  (ESTRACE ) 0.1 MG/GM vaginal cream Place 1 Applicatorful vaginally daily.   fenofibrate  (TRICOR ) 145 MG tablet Take 1 tablet by mouth once daily   losartan  (COZAAR ) 25 MG tablet Take 1 tablet (25 mg total) by mouth daily.   metFORMIN   (GLUCOPHAGE ) 850 MG tablet Take 850 mg by mouth 2 (two) times daily with a meal.   ZETIA  10 MG tablet Take 1 tablet (10 mg total) by mouth daily.   Coenzyme Q10 (CO Q 10 PO) Take 100 mg by mouth daily. (Patient not taking: Reported on 09/03/2023)   cyanocobalamin (,VITAMIN B-12,) 1000 MCG/ML injection Inject 1,000 mcg into the muscle every 30 (thirty) days. (Patient not taking: Reported on 09/03/2023)   diazepam (VALIUM) 5 MG tablet Take 5 mg by mouth as needed for anxiety or sleep. (Patient not taking: Reported on 09/03/2023)   EQUATE STOOL SOFTENER 100 MG capsule Take 100 mg by mouth daily. (Patient not taking: Reported on 09/03/2023)   Fe Fum-FA-B Cmp-C-Zn-Mg-Mn-Cu (FERROCITE PLUS) 106-1 MG TABS Take 1 tablet by mouth daily. (Patient not taking: Reported on 09/03/2023)   meloxicam (MOBIC) 15 MG tablet Take 15 mg by mouth daily. (Patient not taking: Reported on 09/03/2023)   omeprazole (PRILOSEC) 20 MG capsule Take 20 mg by mouth as needed (Reflux). (Patient not taking: Reported on 09/03/2023)   ONETOUCH ULTRA test strip 1 each by Other route as directed. (Patient not taking: Reported on 09/03/2023)   [DISCONTINUED] benzonatate (TESSALON) 200 MG capsule Take 200 mg by mouth 3 (three) times daily as needed for cough. (Patient not taking: Reported on 09/03/2023)  No facility-administered encounter medications on file as of 09/03/2023.    Social History   Tobacco Use   Smoking status: Never   Smokeless tobacco: Never  Vaping Use   Vaping status: Never Used  Substance Use Topics   Alcohol use: No   Drug use: No      Review of Systems  Constitutional:  Negative for diaphoresis, fever, malaise/fatigue and weight loss.  Respiratory:  Negative for cough, shortness of breath and wheezing.   Cardiovascular:  Negative for chest pain, palpitations, orthopnea, claudication, leg swelling and PND.  Musculoskeletal:  Positive for neck pain.      Objective:     BP 123/78 (BP Location: Right Arm,  Patient Position: Standing, Cuff Size: Normal)   Pulse 79   Temp 98.6 F (37 C) (Oral)   Resp 16   Ht 5' 3.78" (1.62 m)   Wt 138 lb 11.2 oz (62.9 kg)   SpO2 96%   BMI 23.97 kg/m    Physical Exam Constitutional:      General: She is not in acute distress.    Appearance: Normal appearance.     Comments: Pleasant.  WDWN  HENT:     Head: Normocephalic.  Neck:     Vascular: No carotid bruit.     Comments: Mild posterior tenderness.  Mildly decreased ROM. Cardiovascular:     Rate and Rhythm: Normal rate and regular rhythm.     Pulses: Normal pulses.     Heart sounds: Normal heart sounds.  Pulmonary:     Effort: Pulmonary effort is normal.     Breath sounds: Normal breath sounds.  Abdominal:     General: Bowel sounds are normal.     Palpations: Abdomen is soft.  Musculoskeletal:     Cervical back: Neck supple. Tenderness present.     Right lower leg: No edema.     Left lower leg: No edema.  Neurological:     Mental Status: She is alert.      No results found for any visits on 09/03/23.    The ASCVD Risk score (Arnett DK, et al., 2019) failed to calculate for the following reasons:   The 2019 ASCVD risk score is only valid for ages 33 to 29    Assessment & Plan:  Chronic neck pain with normal neurological examination Assessment & Plan: Overall doing well, but understandably frustrated with her ongoing neck pain.  Limited records from [facility].  I suspect that Cervical OA is the primary issue and will have a pain specialist to evaluate and consider an ESI, etc.  Promptly request old records from [place].  F/u in one month to reevaluate.  Orders: -     Ambulatory referral to Pain Clinic  Paroxysmal SVT (supraventricular tachycardia) (HCC)  Mixed hyperlipidemia    Return in about 4 weeks (around 10/01/2023) for chronic follow-up.    [name] Reece Cane., MD

## 2023-09-03 NOTE — Assessment & Plan Note (Addendum)
 Overall doing well, but understandably frustrated with her ongoing neck pain.  Limited records from [facility].  I suspect that Cervical OA is the primary issue and will have a pain specialist to evaluate and consider an ESI, etc.  Promptly request old records from [place].  F/u in one month to reevaluate.

## 2023-10-05 ENCOUNTER — Encounter (HOSPITAL_BASED_OUTPATIENT_CLINIC_OR_DEPARTMENT_OTHER): Payer: Self-pay | Admitting: Family Medicine

## 2023-10-05 ENCOUNTER — Ambulatory Visit (INDEPENDENT_AMBULATORY_CARE_PROVIDER_SITE_OTHER): Admitting: Family Medicine

## 2023-10-05 VITALS — BP 121/73 | HR 73 | Ht 63.0 in | Wt 139.0 lb

## 2023-10-05 DIAGNOSIS — R809 Proteinuria, unspecified: Secondary | ICD-10-CM

## 2023-10-05 DIAGNOSIS — G8929 Other chronic pain: Secondary | ICD-10-CM | POA: Diagnosis not present

## 2023-10-05 DIAGNOSIS — E0829 Diabetes mellitus due to underlying condition with other diabetic kidney complication: Secondary | ICD-10-CM

## 2023-10-05 DIAGNOSIS — M8580 Other specified disorders of bone density and structure, unspecified site: Secondary | ICD-10-CM | POA: Insufficient documentation

## 2023-10-05 DIAGNOSIS — M542 Cervicalgia: Secondary | ICD-10-CM

## 2023-10-05 DIAGNOSIS — D510 Vitamin B12 deficiency anemia due to intrinsic factor deficiency: Secondary | ICD-10-CM | POA: Diagnosis not present

## 2023-10-05 DIAGNOSIS — E782 Mixed hyperlipidemia: Secondary | ICD-10-CM | POA: Diagnosis not present

## 2023-10-05 DIAGNOSIS — F419 Anxiety disorder, unspecified: Secondary | ICD-10-CM | POA: Diagnosis not present

## 2023-10-05 DIAGNOSIS — E559 Vitamin D deficiency, unspecified: Secondary | ICD-10-CM

## 2023-10-05 MED ORDER — DIAZEPAM 5 MG PO TABS: 5.0000 mg | ORAL_TABLET | ORAL | 0 refills | Status: AC | PRN

## 2023-10-05 MED ORDER — CYANOCOBALAMIN 1000 MCG/ML IJ SOLN
1000.0000 ug | Freq: Once | INTRAMUSCULAR | Status: AC
Start: 2023-10-05 — End: 2023-10-05
  Administered 2023-10-05: 1000 ug via INTRAMUSCULAR

## 2023-10-05 NOTE — Progress Notes (Signed)
 Established Patient Office Visit  Subjective   Patient ID: Robin Castro, female    DOB: 18-Dec-1940  Age: 83 y.o. MRN: [id number]  Chief Complaint  Patient presents with   Medical Management of Chronic Issues    Pt states her pain is not any better. Does have an appt to see pain management on 7/10.    F/u as above.  Please see last note for details.  She sees the [facility] pain specialist in about ten days which is reasonable.  Her neck pain is significant but manageable.    Past Medical History:  Diagnosis Date   Alopecia    f/by [facility].   Diabetes mellitus    type 2- x 12 years   GERD (gastroesophageal reflux disease)    History of cystocele 01/09/2020   Hyperlipidemia 10/21/2010   Nephrolithiasis 10/15/2020   f/by Urology in [place]   Paroxysmal SVT (supraventricular tachycardia) (HCC) 05/11/2018   f/by Dr. Bernie   Peripheral vascular disease, unspecified (HCC) up to 60% right internal carotid artery 07/05/2019   Second degree uterine prolapse 01/09/2020    Outpatient Encounter Medications as of 10/05/2023  Medication Sig   aspirin  81 MG tablet Take 81 mg by mouth daily.   Calcium  Carbonate-Vitamin D (CALCIUM  600 + D PO) Take 1 tablet by mouth 2 (two) times daily.   CARDIZEM  CD 300 MG 24 hr capsule Take 1 capsule (300 mg total) by mouth daily.   Coenzyme Q10 (CO Q 10 PO) Take 100 mg by mouth daily.   cyanocobalamin (,VITAMIN B-12,) 1000 MCG/ML injection Inject 1,000 mcg into the muscle every 30 (thirty) days.   EQUATE STOOL SOFTENER 100 MG capsule Take 100 mg by mouth daily.   ergocalciferol (VITAMIN D2) 50000 UNITS capsule Take 50,000 Units by mouth every 14 (fourteen) days.   estradiol  (ESTRACE ) 0.1 MG/GM vaginal cream Place 1 Applicatorful vaginally daily.   Fe Fum-FA-B Cmp-C-Zn-Mg-Mn-Cu (FERROCITE PLUS) 106-1 MG TABS Take 1 tablet by mouth daily.   fenofibrate  (TRICOR ) 145 MG tablet Take 1 tablet by mouth once daily   losartan  (COZAAR ) 25 MG  tablet Take 1 tablet (25 mg total) by mouth daily.   meloxicam (MOBIC) 15 MG tablet Take 15 mg by mouth daily.   metFORMIN  (GLUCOPHAGE ) 850 MG tablet Take 850 mg by mouth 2 (two) times daily with a meal.   omeprazole (PRILOSEC) 20 MG capsule Take 20 mg by mouth as needed (Reflux).   ONETOUCH ULTRA test strip 1 each by Other route as directed.   ZETIA  10 MG tablet Take 1 tablet (10 mg total) by mouth daily.   [DISCONTINUED] diazepam (VALIUM) 5 MG tablet Take 5 mg by mouth as needed for anxiety or sleep.   diazepam (VALIUM) 5 MG tablet Take 1 tablet (5 mg total) by mouth as needed.   [EXPIRED] cyanocobalamin (VITAMIN B12) injection 1,000 mcg    No facility-administered encounter medications on file as of 10/05/2023.    Social History   Tobacco Use   Smoking status: Never   Smokeless tobacco: Never  Vaping Use   Vaping status: Never Used  Substance Use Topics   Alcohol use: No   Drug use: No      ROS    Objective:     BP 121/73 (BP Location: Right Arm, Patient Position: Sitting, Cuff Size: Normal)   Pulse 73   Ht 5' 3 (1.6 m)   Wt 139 lb (63 kg)   SpO2 97%   BMI 24.62 kg/m  Physical Exam Constitutional:      General: She is not in acute distress.    Appearance: Normal appearance.  HENT:     Head: Normocephalic.  Neck:     Vascular: No carotid bruit.   Cardiovascular:     Rate and Rhythm: Normal rate and regular rhythm.     Pulses: Normal pulses.     Heart sounds: Normal heart sounds.  Pulmonary:     Effort: Pulmonary effort is normal.     Breath sounds: Normal breath sounds.  Abdominal:     General: Bowel sounds are normal.     Palpations: Abdomen is soft.   Musculoskeletal:     Cervical back: Neck supple. No tenderness.     Right lower leg: No edema.     Left lower leg: No edema.   Neurological:     Mental Status: She is alert.      No results found for any visits on 10/05/23.    The ASCVD Risk score (Arnett DK, et al., 2019) failed to  calculate for the following reasons:   The 2019 ASCVD risk score is only valid for ages 80 to 68    Assessment & Plan:  Chronic neck pain with normal neurological examination Assessment & Plan: Await labs and specialist assistance.  F/u with her soon.  Emphasis on safety proofing her home.   Mixed hyperlipidemia -     Lipid panel  Pernicious anemia -     Cyanocobalamin  Anxiety -     diazePAM; Take 1 tablet (5 mg total) by mouth as needed.  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    [name] PONCE NORLEEN FALCON., MD

## 2023-10-05 NOTE — Assessment & Plan Note (Signed)
 Await labs and specialist assistance.  F/u with her soon.  Emphasis on safety proofing her home.

## 2023-10-06 ENCOUNTER — Ambulatory Visit (HOSPITAL_BASED_OUTPATIENT_CLINIC_OR_DEPARTMENT_OTHER): Payer: Self-pay | Admitting: Family Medicine

## 2023-10-06 LAB — LIPID PANEL
Chol/HDL Ratio: 3.5 ratio (ref 0.0–4.4)
Cholesterol, Total: 195 mg/dL (ref 100–199)
HDL: 55 mg/dL (ref >39)
LDL Chol Calc (NIH): 112 mg/dL — ABNORMAL HIGH (ref 0–99)
Triglycerides: 160 mg/dL — ABNORMAL HIGH (ref 0–149)
VLDL Cholesterol Cal: 28 mg/dL (ref 5–40)

## 2023-10-06 LAB — COMPREHENSIVE METABOLIC PANEL WITH GFR
ALT: 8 IU/L (ref 0–32)
AST: 13 IU/L (ref 0–40)
Albumin: 4.4 g/dL (ref 3.7–4.7)
Alkaline Phosphatase: 58 IU/L (ref 44–121)
BUN/Creatinine Ratio: 30 — ABNORMAL HIGH (ref 12–28)
BUN: 19 mg/dL (ref 8–27)
Bilirubin Total: <0.2 mg/dL (ref 0.0–1.2)
CO2: 21 mmol/L (ref 20–29)
Calcium: 10.3 mg/dL (ref 8.7–10.3)
Chloride: 102 mmol/L (ref 96–106)
Creatinine, Ser: 0.63 mg/dL (ref 0.57–1.00)
Globulin, Total: 2.2 g/dL (ref 1.5–4.5)
Glucose: 68 mg/dL — ABNORMAL LOW (ref 70–99)
Potassium: 5.1 mmol/L (ref 3.5–5.2)
Sodium: 140 mmol/L (ref 134–144)
Total Protein: 6.6 g/dL (ref 6.0–8.5)
eGFR: 89 mL/min/{1.73_m2} (ref >59)

## 2023-10-06 LAB — CBC WITH DIFFERENTIAL/PLATELET
Basophils Absolute: 0.1 10*3/uL (ref 0.0–0.2)
Basos: 1 %
EOS (ABSOLUTE): 0.2 10*3/uL (ref 0.0–0.4)
Eos: 3 %
Hematocrit: 38.4 % (ref 34.0–46.6)
Hemoglobin: 12.2 g/dL (ref 11.1–15.9)
Immature Grans (Abs): 0 10*3/uL (ref 0.0–0.1)
Immature Granulocytes: 0 %
Lymphocytes Absolute: 2.4 10*3/uL (ref 0.7–3.1)
Lymphs: 28 %
MCH: 28.9 pg (ref 26.6–33.0)
MCHC: 31.8 g/dL (ref 31.5–35.7)
MCV: 91 fL (ref 79–97)
Monocytes Absolute: 1.2 10*3/uL — ABNORMAL HIGH (ref 0.1–0.9)
Monocytes: 14 %
Neutrophils Absolute: 4.6 10*3/uL (ref 1.4–7.0)
Neutrophils: 54 %
Platelets: 391 10*3/uL (ref 150–450)
RBC: 4.22 x10E6/uL (ref 3.77–5.28)
RDW: 13 % (ref 11.7–15.4)
WBC: 8.5 10*3/uL (ref 3.4–10.8)

## 2023-10-06 LAB — HEMOGLOBIN A1C
Est. average glucose Bld gHb Est-mCnc: 143 mg/dL
Hgb A1c MFr Bld: 6.6 % — ABNORMAL HIGH (ref 4.8–5.6)

## 2023-10-06 LAB — MICROALBUMIN / CREATININE URINE RATIO
Creatinine, Urine: 62.5 mg/dL
Microalb/Creat Ratio: 23 mg/g{creat} (ref 0–29)
Microalbumin, Urine: 14.6 ug/mL

## 2023-10-06 LAB — VITAMIN D 25 HYDROXY (VIT D DEFICIENCY, FRACTURES): Vit D, 25-Hydroxy: 61.7 ng/mL (ref 30.0–100.0)

## 2023-10-08 ENCOUNTER — Ambulatory Visit (HOSPITAL_BASED_OUTPATIENT_CLINIC_OR_DEPARTMENT_OTHER)
Admission: RE | Admit: 2023-10-08 | Discharge: 2023-10-08 | Disposition: A | Discharge status: Home / Self Care | Source: Ambulatory Visit | Attending: Family Medicine | Admitting: Family Medicine

## 2023-10-08 DIAGNOSIS — M8580 Other specified disorders of bone density and structure, unspecified site: Secondary | ICD-10-CM

## 2023-10-08 DIAGNOSIS — Z780 Asymptomatic menopausal state: Secondary | ICD-10-CM

## 2023-10-08 DIAGNOSIS — E2839 Other primary ovarian failure: Secondary | ICD-10-CM

## 2023-10-12 ENCOUNTER — Other Ambulatory Visit (HOSPITAL_BASED_OUTPATIENT_CLINIC_OR_DEPARTMENT_OTHER): Payer: Self-pay | Admitting: Family Medicine

## 2023-10-12 DIAGNOSIS — M810 Age-related osteoporosis without current pathological fracture: Secondary | ICD-10-CM

## 2023-10-15 DIAGNOSIS — M5481 Occipital neuralgia: Secondary | ICD-10-CM | POA: Insufficient documentation

## 2023-10-26 ENCOUNTER — Telehealth: Payer: Self-pay

## 2023-10-26 NOTE — Telephone Encounter (Signed)
   Pre-operative Risk Assessment    Patient Name: Robin Castro  DOB: [birth year] MRN: [id number]   Date of last office visit: 07/10/23 LAMAR FITCH, MD Date of next office visit: NONE   Request for Surgical Clearance    Procedure:  INJECTION  Date of Surgery:  Clearance TBD                                Surgeon:  NOT INDICATED Surgeon's Group or Practice Name:  [facility] Phone number:  [phone] Fax number:  [phone]   Type of Clearance Requested:   - Medical  - Pharmacy:  Hold Aspirin  10 DAYS   Type of Anesthesia:  Not Indicated   Additional requests/questions:    SignedLucie DELENA Ku   10/26/2023, 4:34 PM

## 2023-10-27 ENCOUNTER — Telehealth: Payer: Self-pay

## 2023-10-27 NOTE — Telephone Encounter (Signed)
 Called patient to schedule TELEVISIT patient stated she is scheduled to get injection on Wednesday 7/30 and she stopped her Aspirin  on Sunday 7/20 patient is scheduled for televisit this Friday 7/25 med rec and consent done     Patient Consent for Virtual Visit         Robin Castro has provided verbal consent on 10/27/2023 for a virtual visit (video or telephone).   CONSENT FOR VIRTUAL VISIT FOR:  Robin Castro  By participating in this virtual visit I agree to the following:  I hereby voluntarily request, consent and authorize [facility] and its employed or contracted physicians, physician assistants, nurse practitioners or other licensed health care professionals (the Practitioner), to provide me with telemedicine health care services (the "Services) as deemed necessary by the treating Practitioner. I acknowledge and consent to receive the Services by the Practitioner via telemedicine. I understand that the telemedicine visit will involve communicating with the Practitioner through live audiovisual communication technology and the disclosure of certain medical information by electronic transmission. I acknowledge that I have been given the opportunity to request an in-person assessment or other available alternative prior to the telemedicine visit and am voluntarily participating in the telemedicine visit.  I understand that I have the right to withhold or withdraw my consent to the use of telemedicine in the course of my care at any time, without affecting my right to future care or treatment, and that the Practitioner or I may terminate the telemedicine visit at any time. I understand that I have the right to inspect all information obtained and/or recorded in the course of the telemedicine visit and may receive copies of available information for a reasonable fee.  I understand that some of the potential risks of receiving the Services via telemedicine include:  Delay or  interruption in medical evaluation due to technological equipment failure or disruption; Information transmitted may not be sufficient (e.g. poor resolution of images) to allow for appropriate medical decision making by the Practitioner; and/or  In rare instances, security protocols could fail, causing a breach of personal health information.  Furthermore, I acknowledge that it is my responsibility to provide information about my medical history, conditions and care that is complete and accurate to the best of my ability. I acknowledge that Practitioner's advice, recommendations, and/or decision may be based on factors not within their control, such as incomplete or inaccurate data provided by me or distortions of diagnostic images or specimens that may result from electronic transmissions. I understand that the practice of medicine is not an exact science and that Practitioner makes no warranties or guarantees regarding treatment outcomes. I acknowledge that a copy of this consent can be made available to me via my patient portal [facility] MyChart), or I can request a printed copy by calling the office of  [facility].    I understand that my insurance will be billed for this visit.   I have read or had this consent read to me. I understand the contents of this consent, which adequately explains the benefits and risks of the Services being provided via telemedicine.  I have been provided ample opportunity to ask questions regarding this consent and the Services and have had my questions answered to my satisfaction. I give my informed consent for the services to be provided through the use of telemedicine in my medical care

## 2023-10-27 NOTE — Telephone Encounter (Signed)
 Primary Cardiologist:Robert Bernie, MD   Preoperative team, please contact this patient and set up a phone call appointment for further preoperative risk assessment. Please obtain consent and complete medication review. Thank you for your help.   I confirm that guidance regarding antiplatelet and oral anticoagulation therapy has been completed and, if necessary, noted below.  Per office protocol and pending no concerning cardiac symptoms at the time of the call, she may hold aspirin  for 5-7 days prior to procedure and should resume as soon as hemodynamically stable postoperatively.  I also confirmed the patient resides in the state of [place] . As per [facility] Medical Board telemedicine laws, the patient must reside in the state in which the provider is licensed.   Rosaline EMERSON Bane, NP-C  10/27/2023, 11:56 AM [address] [place], [place] [address] Office [phone] Fax [phone]

## 2023-10-27 NOTE — Telephone Encounter (Signed)
 Called patient to schedule TELEVISIT patient stated she is scheduled to get injection on Wednesday 7/30 and she stopped her Aspirin  on Sunday 7/20 patient is scheduled for televisit this Friday 7/25 med rec and consent done

## 2023-10-30 ENCOUNTER — Ambulatory Visit: Attending: Internal Medicine

## 2023-10-30 DIAGNOSIS — Z01810 Encounter for preprocedural cardiovascular examination: Secondary | ICD-10-CM

## 2023-10-30 NOTE — Progress Notes (Signed)
 Virtual Visit via Telephone Note   Because of Robin Castro co-morbid illnesses, she is at least at moderate risk for complications without adequate follow up.  This format is felt to be most appropriate for this patient at this time.  Due to technical limitations with video connection (technology), today's appointment will be conducted as an audio only telehealth visit, and Robin Castro verbally agreed to proceed in this manner.   All issues noted in this document were discussed and addressed.  No physical exam could be performed with this format.  Evaluation Performed:  Preoperative cardiovascular risk assessment _____________   Date:  10/30/2023   Patient ID:  Robin Castro, Robin Castro [birth year], MRN [id number] Patient Location:  Home Provider location:   Office  Primary Care Provider:  Dottie Norleen PHEBE PONCE, MD Primary Cardiologist:  Lamar Fitch, MD  Chief Complaint / Patient Profile   83 y.o. y/o female with a h/o supraventricular tachycardia, diabetes mellitus, chest discomfort, hyperlipidemia, and palpitations who is pending injection in her neck and presents today for telephonic preoperative cardiovascular risk assessment.  History of Present Illness    Robin Castro is a 83 y.o. female who presents via audio/video conferencing for a telehealth visit today.  Pt was last seen in cardiology clinic on 07/10/2023 by Dr. Fitch.  At that time Robin Castro was doing well .  The patient is now pending procedure as outlined above. Since her last visit, she tells me her stress test last year was good. She has been dealing with neck issues. She fell in the living room and got whip lash. This is her main issue. She was diagnosed with bilateral occipital neurologia. She takes cardizem  and zetia . No more chest discomfort, some slight ones but nothing major. Several months have not had an issue. She walks three miles a day, 5 days a week.   Per office protocol and pending no  concerning cardiac symptoms at the time of the call, she may hold aspirin  for 5-7 days prior to procedure and should resume as soon as hemodynamically stable postoperatively.   Past Medical History    Past Medical History:  Diagnosis Date   Alopecia    f/by [facility].   Diabetes mellitus    type 2- x 12 years   GERD (gastroesophageal reflux disease)    History of cystocele 01/09/2020   Hyperlipidemia 10/21/2010   Nephrolithiasis 10/15/2020   f/by Urology in [place]   Paroxysmal SVT (supraventricular tachycardia) (HCC) 05/11/2018   f/by Dr. Fitch   Peripheral vascular disease, unspecified (HCC) up to 60% right internal carotid artery 07/05/2019   Second degree uterine prolapse 01/09/2020   Past Surgical History:  Procedure Laterality Date   APPENDECTOMY     BILATERAL SALPINGECTOMY  04/19/2014   Procedure: BILATERAL SALPINGECTOMY;  Surgeon: Rosaline DELENA Luna, MD;  Location: [facility];  Service: Gynecology;;   BLADDER SUSPENSION N/A 04/19/2014   Procedure: TRANSVAGINAL TAPE (TVT) PROCEDURE;  Surgeon: Rosaline DELENA Luna, MD;  Location: [facility];  Service: Gynecology;  Laterality: N/A;   BREAST BIOPSY Left    BREAST LUMPECTOMY WITH RADIOACTIVE SEED LOCALIZATION Left 06/06/2020   Procedure: LEFT BREAST LUMPECTOMY WITH RADIOACTIVE SEED LOCALIZATION;  Surgeon: Belinda Cough, MD;  Location: [facility];  Service: General;  Laterality: Left;   COLONOSCOPY     CYSTOCELE REPAIR N/A 04/19/2014   Procedure: ANTERIOR REPAIR (CYSTOCELE);  Surgeon: Rosaline DELENA Luna, MD;  Location: [facility];  Service: Gynecology;  Laterality: N/A;  CYSTOSCOPY N/A 04/19/2014   Procedure: CYSTOSCOPY;  Surgeon: Rosaline DELENA Luna, MD;  Location: [facility];  Service: Gynecology;  Laterality: N/A;   TOTAL KNEE ARTHROPLASTY     VAGINAL HYSTERECTOMY N/A 04/19/2014   Procedure: HYSTERECTOMY VAGINAL;  Surgeon: Rosaline DELENA Luna, MD;  Location: [facility];  Service: Gynecology;  Laterality: N/A;     Allergies  Allergies  Allergen Reactions   Lansoprazole     Throat swelling   Statins Hives    Tolerates Crestor    Empagliflozin Other (See Comments)    Recurrent UTI   Enalapril Cough   Niacin And Related Hives and Rash    hives    Home Medications    Prior to Admission medications   Medication Sig Start Date End Date Taking? Authorizing Provider  aspirin  81 MG tablet Take 81 mg by mouth daily.    [provider]  Calcium  Carbonate-Vitamin D  (CALCIUM  600 + D PO) Take 1 tablet by mouth 2 (two) times daily.    [provider]  CARDIZEM  CD 300 MG 24 hr capsule Take 1 capsule (300 mg total) by mouth daily. 07/10/23   Krasowski, Robert J, MD  Coenzyme Q10 (CO Q 10 PO) Take 100 mg by mouth daily.    [provider]  cyanocobalamin  (,VITAMIN B-12,) 1000 MCG/ML injection Inject 1,000 mcg into the muscle every 30 (thirty) days.    [provider]  diazepam  (VALIUM ) 5 MG tablet Take 1 tablet (5 mg total) by mouth as needed. 10/05/23   Robin Norleen PHEBE PONCE, MD  EQUATE STOOL SOFTENER 100 MG capsule Take 100 mg by mouth daily. 03/18/23   [provider]  ergocalciferol  (VITAMIN D2) 50000 UNITS capsule Take 50,000 Units by mouth every 14 (fourteen) days.    [provider]  estradiol  (ESTRACE ) 0.1 MG/GM vaginal cream Place 1 Applicatorful vaginally daily. 01/12/23   [provider]  Fe Fum-FA-B Cmp-C-Zn-Mg-Mn-Cu (FERROCITE PLUS) 106-1 MG TABS Take 1 tablet by mouth daily.    [provider]  fenofibrate  (TRICOR ) 145 MG tablet Take 1 tablet by mouth once daily 02/13/23   Krasowski, Robert J, MD  losartan  (COZAAR ) 25 MG tablet Take 1 tablet (25 mg total) by mouth daily. 08/09/21   Krasowski, Robert J, MD  meloxicam (MOBIC) 15 MG tablet Take 15 mg by mouth daily. 05/11/23   [provider]  metFORMIN  (GLUCOPHAGE ) 850 MG tablet Take 850 mg by mouth 2 (two) times daily with a meal.    [provider]  omeprazole  (PRILOSEC) 20 MG capsule Take 20 mg by mouth as needed (Reflux).    [provider]  [facility] ULTRA test strip 1 each by Other route as directed. 05/02/23   [provider]  ZETIA  10 MG tablet Take 1 tablet (10 mg total) by mouth daily. 07/10/23   Krasowski, Robert J, MD    Physical Exam    Vital Signs:  Robin Castro does not have vital signs available for review today.  Given telephonic nature of communication, physical exam is limited. AAOx3. NAD. Normal affect.  Speech and respirations are unlabored.  Accessory Clinical Findings    None  Assessment & Plan    1.  Preoperative Cardiovascular Risk Assessment:   Robin Castro perioperative risk of a major cardiac event is 0.4% according to the Revised Cardiac Risk Index (RCRI).  Therefore, she is at low risk for perioperative complications.   Her functional capacity is good at 5.07 METs according to the [facility] Activity  Status Index (DASI). Recommendations: According to ACC/AHA guidelines, no further cardiovascular testing needed.  The patient may proceed to surgery at acceptable risk.   Antiplatelet and/or Anticoagulation Recommendations: Aspirin  can be held for 5-7 days prior to her surgery.  Please resume Aspirin  post operatively when it is felt to be safe from a bleeding standpoint.   The patient was advised that if she develops new symptoms prior to surgery to contact our office to arrange for a follow-up visit, and she verbalized understanding.   A copy of this note will be routed to requesting surgeon.  Time:   Today, I have spent 7 minutes with the patient with telehealth technology discussing medical history, symptoms, and management plan.     Orren Robin Fabry, PA-C  10/30/2023, 8:03 AM

## 2023-11-04 DIAGNOSIS — M47812 Spondylosis without myelopathy or radiculopathy, cervical region: Secondary | ICD-10-CM | POA: Insufficient documentation

## 2023-11-23 DIAGNOSIS — M47819 Spondylosis without myelopathy or radiculopathy, site unspecified: Secondary | ICD-10-CM | POA: Insufficient documentation

## 2023-12-08 ENCOUNTER — Ambulatory Visit (INDEPENDENT_AMBULATORY_CARE_PROVIDER_SITE_OTHER)

## 2023-12-08 DIAGNOSIS — D510 Vitamin B12 deficiency anemia due to intrinsic factor deficiency: Secondary | ICD-10-CM | POA: Diagnosis not present

## 2023-12-08 MED ORDER — CYANOCOBALAMIN 1000 MCG/ML IJ SOLN
1000.0000 ug | Freq: Once | INTRAMUSCULAR | Status: AC
  Administered 2023-12-08: 1000 ug via INTRAMUSCULAR

## 2023-12-08 NOTE — Progress Notes (Signed)
 Patient is in office today for a nurse visit for B12 Injection. Patient Injection was given in the  Left deltoid. Patient tolerated injection well.

## 2023-12-29 DIAGNOSIS — N201 Calculus of ureter: Secondary | ICD-10-CM | POA: Insufficient documentation

## 2024-01-05 ENCOUNTER — Ambulatory Visit (HOSPITAL_BASED_OUTPATIENT_CLINIC_OR_DEPARTMENT_OTHER): Admitting: Family Medicine

## 2024-01-06 ENCOUNTER — Encounter (HOSPITAL_BASED_OUTPATIENT_CLINIC_OR_DEPARTMENT_OTHER): Payer: Self-pay

## 2024-01-06 ENCOUNTER — Ambulatory Visit (INDEPENDENT_AMBULATORY_CARE_PROVIDER_SITE_OTHER)

## 2024-01-06 VITALS — BP 123/66 | Ht 63.0 in | Wt 135.0 lb

## 2024-01-06 DIAGNOSIS — Z0000 Encounter for general adult medical examination without abnormal findings: Secondary | ICD-10-CM

## 2024-01-06 NOTE — Progress Notes (Signed)
 Because this visit was a virtual/telehealth visit,  certain criteria was not obtained, such a blood pressure, CBG if applicable, and timed get up and go. Any medications not marked as taking were not mentioned during the medication reconciliation part of the visit. Any vitals not documented were not able to be obtained due to this being a telehealth visit or patient was unable to self-report a recent blood pressure reading due to a lack of equipment at home via telehealth. Vitals that have been documented are verbally provided by the patient.   This visit was performed by a medical professional under my direct supervision. I was immediately available for consultation/collaboration. I have reviewed and agree with the Annual Wellness Visit documentation.   Subjective:   Robin Castro is a 83 y.o. who presents for a Medicare Wellness preventive visit.  As a reminder, Annual Wellness Visits don't include a physical exam, and some assessments may be limited, especially if this visit is performed virtually. We may recommend an in-person follow-up visit with your provider if needed.  Visit Complete: Virtual I connected with  Robin Castro on 01/06/24 by a audio enabled telemedicine application and verified that I am speaking with the correct person using two identifiers.  Patient Location: Home  Provider Location: Home Office  I discussed the limitations of evaluation and management by telemedicine. The patient expressed understanding and agreed to proceed.  Vital Signs: Because this visit was a virtual/telehealth visit, some criteria may be missing or patient reported. Any vitals not documented were not able to be obtained and vitals that have been documented are patient reported.  VideoDeclined- This patient declined Librarian, academic. Therefore the visit was completed with audio only.  Persons Participating in Visit: Patient.  AWV Questionnaire: No: Patient  Medicare AWV questionnaire was not completed prior to this visit.  Cardiac Risk Factors include: advanced age (>11men, >72 women);hypertension;diabetes mellitus     Objective:    Today's Vitals   01/06/24 0857  BP: 123/66  Weight: 135 lb (61.2 kg)  Height: 5' 3 (1.6 m)   Body mass index is 23.91 kg/m.     01/06/2024    8:57 AM 11/19/2022    5:05 PM 06/06/2020    7:14 AM 05/31/2020   11:26 AM 04/19/2014   12:30 PM 04/19/2014    7:01 AM 04/13/2014   10:16 AM  Advanced Directives  Does Patient Have a Medical Advance Directive? Yes No No No No  No  No   Type of Estate agent of [place];Living will        Does patient want to make changes to medical advance directive? No - Patient declined        Copy of Healthcare Power of Attorney in Chart? No - copy requested        Would patient like information on creating a medical advance directive?  No - Patient declined No - Patient declined No - Patient declined No - patient declined information  No - patient declined information  No - patient declined information      Data saved with a previous flowsheet row definition    Current Medications (verified) Outpatient Encounter Medications as of 01/06/2024  Medication Sig   aspirin  81 MG tablet Take 81 mg by mouth daily.   Calcium  Carbonate-Vitamin D  (CALCIUM  600 + D PO) Take 1 tablet by mouth 2 (two) times daily.   CARDIZEM  CD 300 MG 24 hr capsule Take 1 capsule (300 mg total)  by mouth daily.   Coenzyme Q10 (CO Q 10 PO) Take 100 mg by mouth daily.   cyanocobalamin  (,VITAMIN B-12,) 1000 MCG/ML injection Inject 1,000 mcg into the muscle every 30 (thirty) days.   diazepam  (VALIUM ) 5 MG tablet Take 1 tablet (5 mg total) by mouth as needed.   EQUATE STOOL SOFTENER 100 MG capsule Take 100 mg by mouth daily.   ergocalciferol  (VITAMIN D2) 50000 UNITS capsule Take 50,000 Units by mouth every 14 (fourteen) days.   estradiol  (ESTRACE ) 0.1 MG/GM vaginal cream Place 1 Applicatorful  vaginally daily.   Fe Fum-FA-B Cmp-C-Zn-Mg-Mn-Cu (FERROCITE PLUS) 106-1 MG TABS Take 1 tablet by mouth daily.   fenofibrate  (TRICOR ) 145 MG tablet Take 1 tablet by mouth once daily   losartan  (COZAAR ) 25 MG tablet Take 1 tablet (25 mg total) by mouth daily.   meloxicam (MOBIC) 15 MG tablet Take 15 mg by mouth daily.   metFORMIN  (GLUCOPHAGE ) 850 MG tablet Take 850 mg by mouth 2 (two) times daily with a meal.   omeprazole (PRILOSEC) 20 MG capsule Take 20 mg by mouth as needed (Reflux).   ONETOUCH ULTRA test strip 1 each by Other route as directed.   ZETIA  10 MG tablet Take 1 tablet (10 mg total) by mouth daily.   No facility-administered encounter medications on file as of 01/06/2024.    Allergies (verified) Lansoprazole, Statins, Empagliflozin, Enalapril, and Niacin and related   History: Past Medical History:  Diagnosis Date   Alopecia    f/by Ruthellen Domino.   Diabetes mellitus    type 2- x 12 years   GERD (gastroesophageal reflux disease)    History of cystocele 01/09/2020   Hyperlipidemia 10/21/2010   Nephrolithiasis 10/15/2020   f/by Urology in [place]   Paroxysmal SVT (supraventricular tachycardia) 05/11/2018   f/by Dr. Bernie   Peripheral vascular disease, unspecified (HCC) up to 60% Castro internal carotid artery 07/05/2019   Second degree uterine prolapse 01/09/2020   Past Surgical History:  Procedure Laterality Date   APPENDECTOMY     BILATERAL SALPINGECTOMY  04/19/2014   Procedure: BILATERAL SALPINGECTOMY;  Surgeon: Rosaline DELENA Luna, MD;  Location: [facility];  Service: Gynecology;;   BLADDER SUSPENSION N/A 04/19/2014   Procedure: TRANSVAGINAL TAPE (TVT) PROCEDURE;  Surgeon: Rosaline DELENA Luna, MD;  Location: [facility];  Service: Gynecology;  Laterality: N/A;   BREAST BIOPSY Left    BREAST LUMPECTOMY WITH RADIOACTIVE SEED LOCALIZATION Left 06/06/2020   Procedure: LEFT BREAST LUMPECTOMY WITH RADIOACTIVE SEED LOCALIZATION;  Surgeon: Belinda Cough, MD;  Location: [facility];  Service: General;  Laterality: Left;   COLONOSCOPY     CYSTOCELE REPAIR N/A 04/19/2014   Procedure: ANTERIOR REPAIR (CYSTOCELE);  Surgeon: Rosaline DELENA Luna, MD;  Location: [facility];  Service: Gynecology;  Laterality: N/A;   CYSTOSCOPY N/A 04/19/2014   Procedure: CYSTOSCOPY;  Surgeon: Rosaline DELENA Luna, MD;  Location: [facility];  Service: Gynecology;  Laterality: N/A;   TOTAL KNEE ARTHROPLASTY     VAGINAL HYSTERECTOMY N/A 04/19/2014   Procedure: HYSTERECTOMY VAGINAL;  Surgeon: Rosaline DELENA Luna, MD;  Location: [facility];  Service: Gynecology;  Laterality: N/A;   Family History  Problem Relation Age of Onset   Heart disease Other    Diabetes Other    Hypertension Other    Hyperlipidemia Other    Social History   Socioeconomic History   Marital status: Widowed    Spouse name: Not on file   Number of children: 2   Years of education: Not  on file   Highest education level: Not on file  Occupational History   Occupation: retired Ship broker  Tobacco Use   Smoking status: Never   Smokeless tobacco: Never  Vaping Use   Vaping status: Never Used  Substance and Sexual Activity   Alcohol use: No   Drug use: No   Sexual activity: Yes    Birth control/protection: Post-menopausal  Other Topics Concern   Not on file  Social History Narrative   Not on file   Social Drivers of Health   Financial Resource Strain: Low Risk  (01/06/2024)   Overall Financial Resource Strain (CARDIA)    Difficulty of Paying Living Expenses: Not hard at all  Food Insecurity: No Food Insecurity (01/06/2024)   Hunger Vital Sign    Worried About Running Out of Food in the Last Year: Never true    Ran Out of Food in the Last Year: Never true  Transportation Needs: No Transportation Needs (01/06/2024)   PRAPARE - Administrator, Civil Service (Medical): No    Lack of Transportation (Non-Medical): No  Physical Activity: Sufficiently Active (01/06/2024)   Exercise Vital Sign    Days  of Exercise per Week: 5 days    Minutes of Exercise per Session: 120 min  Stress: No Stress Concern Present (01/06/2024)   [employer] of Occupational Health - Occupational Stress Questionnaire    Feeling of Stress: Not at all  Social Connections: Moderately Integrated (01/06/2024)   Social Connection and Isolation Panel    Frequency of Communication with Friends and Family: More than three times a week    Frequency of Social Gatherings with Friends and Family: Twice a week    Attends Religious Services: More than 4 times per year    Active Member of [employer] or Organizations: Yes    Attends Banker Meetings: More than 4 times per year    Marital Status: Widowed    Tobacco Counseling Counseling given: Not Answered    Clinical Intake:  Pre-visit preparation completed: Yes  Pain : No/denies pain     BMI - recorded: 23.91 Nutritional Status: BMI of 19-24  Normal Nutritional Risks: None Diabetes: No  Lab Results  Component Value Date   HGBA1C 6.6 (H) 10/05/2023     How often do you need to have someone help you when you read instructions, pamphlets, or other written materials from your doctor or pharmacy?: 1 - Never  Interpreter Needed?: No  Information entered by :: Jazmina Muhlenkamp,CMA   Activities of Daily Living     01/06/2024    9:00 AM  In your present state of health, do you have any difficulty performing the following activities:  Hearing? 0  Vision? 0  Difficulty concentrating or making decisions? 0  Walking or climbing stairs? 0  Dressing or bathing? 0  Doing errands, shopping? 0  Preparing Food and eating ? N  Using the Toilet? N  In the past six months, have you accidently leaked urine? N  Do you have problems with loss of bowel control? N  Managing your Medications? N  Managing your Finances? N  Housekeeping or managing your Housekeeping? N    Patient Care Team: Dottie Norleen PHEBE PONCE, MD as PCP - General (Family  Medicine) Bernie Lamar PARAS, MD as PCP - Cardiology (Cardiology) Dottie Norleen PHEBE PONCE, MD (Unknown Physician Specialty)  I have updated your Care Teams any recent Medical Services you may have received from other providers in the past year.  Assessment:   This is a routine wellness examination for Robin Castro.  Hearing/Vision screen Hearing Screening - Comments:: No difficulties  Vision Screening - Comments:: Patient has had cataract surgery. Patient wears glasses    Goals Addressed             This Visit's Progress    Patient Stated       To get better        Depression Screen     01/06/2024    9:02 AM 10/05/2023    8:55 AM 09/03/2023    9:00 AM  PHQ 2/9 Scores  PHQ - 2 Score 0 0 2  PHQ- 9 Score 2 2 5     Fall Risk     01/06/2024    9:00 AM 10/05/2023    8:55 AM 09/03/2023    8:59 AM  Fall Risk   Falls in the past year? 0 1 1  Number falls in past yr: 0  0  Injury with Fall? 0 1 1  Risk for fall due to : No Fall Risks Impaired balance/gait;History of fall(s) History of fall(s)  Follow up Falls evaluation completed Falls evaluation completed Falls evaluation completed    MEDICARE RISK AT HOME:  Medicare Risk at Home Any stairs in or around the home?: Yes If so, are there any without handrails?: No Home free of loose throw rugs in walkways, pet beds, electrical cords, etc?: Yes Adequate lighting in your home to reduce risk of falls?: Yes Life alert?: No Use of a cane, walker or w/c?: No Grab bars in the bathroom?: Yes Shower chair or bench in shower?: Yes Elevated toilet seat or a handicapped toilet?: Yes  TIMED UP AND GO:  Was the test performed?  No  Cognitive Function: 6CIT completed        01/06/2024    9:03 AM  6CIT Screen  What Year? 0 points  What month? 0 points  What time? 0 points  Count back from 20 0 points  Months in reverse 0 points  Repeat phrase 0 points  Total Score 0 points    Immunizations Immunization History   Administered Date(s) Administered   Influenza Inj Mdck Quad With Preservative 02/05/2018   Influenza, Quadrivalent, Recombinant, Inj, Pf 01/15/2017, 02/08/2018, 12/20/2018, 01/28/2021, 02/10/2023   Influenza-Unspecified 12/06/2013, 01/06/2016   PFIZER(Purple Top)SARS-COV-2 Vaccination 04/20/2019, 05/11/2019, 12/09/2019, 08/23/2020, 01/10/2021   Zoster Recombinant(Shingrix) 11/25/2017, 05/11/2018    Screening Tests Health Maintenance  Topic Date Due   FOOT EXAM  Never done   DTaP/Tdap/Td (1 - Tdap) Never done   Pneumococcal Vaccine: 50+ Years (1 of 2 - PCV) Never done   OPHTHALMOLOGY EXAM  12/28/2022   Influenza Vaccine  11/06/2023   COVID-19 Vaccine (6 - 2025-26 season) 12/07/2023   HEMOGLOBIN A1C  04/05/2024   Diabetic kidney evaluation - eGFR measurement  10/04/2024   Diabetic kidney evaluation - Urine ACR  10/04/2024   Medicare Annual Wellness (AWV)  01/05/2025   DEXA SCAN  Completed   Zoster Vaccines- Shingrix  Completed   HPV VACCINES  Aged Out   Meningococcal B Vaccine  Aged Out    Health Maintenance Items Addressed:patient declined   Additional Screening:  Vision Screening: Recommended annual ophthalmology exams for early detection of glaucoma and other disorders of the eye. Is the patient up to date with their annual eye exam?  Yes  Who is the provider or what is the name of the office in which the patient attends annual eye exams? Dr. Arloa at  [facility] in [place]  Dental Screening: Recommended annual dental exams for proper oral hygiene  Community Resource Referral / Chronic Care Management: CRR required this visit?  No   CCM required this visit?  No   Plan:    I have personally reviewed and noted the following in the patient's chart:   Medical and social history Use of alcohol, tobacco or illicit drugs  Current medications and supplements including opioid prescriptions. Patient is not currently taking opioid prescriptions. Functional ability and  status Nutritional status Physical activity Advanced directives List of other physicians Hospitalizations, surgeries, and ER visits in previous 12 months Vitals Screenings to include cognitive, depression, and falls Referrals and appointments  In addition, I have reviewed and discussed with patient certain preventive protocols, quality metrics, and best practice recommendations. A written personalized care plan for preventive services as well as general preventive health recommendations were provided to patient.   Robin Castro, [place]   01/06/2024   After Visit Summary: (MyChart) Due to this being a telephonic visit, the after visit summary with patients personalized plan was offered to patient via MyChart   Notes: Nothing significant to report at this time.

## 2024-01-06 NOTE — Addendum Note (Signed)
 Addended by: ANDERSON LYLE POUR on: 01/06/2024 09:38 AM   Modules accepted: Level of Service

## 2024-01-06 NOTE — Patient Instructions (Signed)
 Ms. Jentsch,  Thank you for taking the time for your Medicare Wellness Visit. I appreciate your continued commitment to your health goals. Please review the care plan we discussed, and feel free to reach out if I can assist you further.  Medicare recommends these wellness visits once per year to help you and your care team stay ahead of potential health issues. These visits are designed to focus on prevention, allowing your provider to concentrate on managing your acute and chronic conditions during your regular appointments.  Please note that Annual Wellness Visits do not include a physical exam. Some assessments may be limited, especially if the visit was conducted virtually. If needed, we may recommend a separate in-person follow-up with your provider.  Ongoing Care Seeing your primary care provider every 3 to 6 months helps us  monitor your health and provide consistent, personalized care.   Referrals If a referral was made during today's visit and you haven't received any updates within two weeks, please contact the referred provider directly to check on the status.  Recommended Screenings:  Health Maintenance  Topic Date Due   Complete foot exam   Never done   DTaP/Tdap/Td vaccine (1 - Tdap) Never done   Pneumococcal Vaccine for age over 54 (1 of 2 - PCV) Never done   Eye exam for diabetics  12/28/2022   Medicare Annual Wellness Visit  06/05/2023   Flu Shot  11/06/2023   COVID-19 Vaccine (6 - 2025-26 season) 12/07/2023   Hemoglobin A1C  04/05/2024   Yearly kidney function blood test for diabetes  10/04/2024   Yearly kidney health urinalysis for diabetes  10/04/2024   DEXA scan (bone density measurement)  Completed   Zoster (Shingles) Vaccine  Completed   HPV Vaccine  Aged Out   Meningitis B Vaccine  Aged Out       01/06/2024    8:57 AM  Advanced Directives  Does Patient Have a Medical Advance Directive? Yes  Type of Estate agent of [place];Living will   Does patient want to make changes to medical advance directive? No - Patient declined  Copy of Healthcare Power of Attorney in Chart? No - copy requested   Advance Care Planning is important because it: Ensures you receive medical care that aligns with your values, goals, and preferences. Provides guidance to your family and loved ones, reducing the emotional burden of decision-making during critical moments.  Vision: Annual vision screenings are recommended for early detection of glaucoma, cataracts, and diabetic retinopathy. These exams can also reveal signs of chronic conditions such as diabetes and high blood pressure.  Dental: Annual dental screenings help detect early signs of oral cancer, gum disease, and other conditions linked to overall health, including heart disease and diabetes.  Please see the attached documents for additional preventive care recommendations.

## 2024-01-08 ENCOUNTER — Ambulatory Visit (HOSPITAL_BASED_OUTPATIENT_CLINIC_OR_DEPARTMENT_OTHER)

## 2024-01-13 ENCOUNTER — Ambulatory Visit: Admitting: "Endocrinology

## 2024-01-20 ENCOUNTER — Ambulatory Visit (INDEPENDENT_AMBULATORY_CARE_PROVIDER_SITE_OTHER): Admitting: *Deleted

## 2024-01-20 DIAGNOSIS — D510 Vitamin B12 deficiency anemia due to intrinsic factor deficiency: Secondary | ICD-10-CM

## 2024-01-20 MED ORDER — CYANOCOBALAMIN 1000 MCG/ML IJ SOLN
1000.0000 ug | Freq: Once | INTRAMUSCULAR | Status: AC
  Administered 2024-01-20: 1000 ug via INTRAMUSCULAR

## 2024-01-20 NOTE — Progress Notes (Signed)
 Patient is in office today for a nurse visit for B12 Injection. Patient Injection was given in the  Left deltoid. Patient tolerated injection well.

## 2024-02-01 ENCOUNTER — Ambulatory Visit (INDEPENDENT_AMBULATORY_CARE_PROVIDER_SITE_OTHER): Admitting: Family Medicine

## 2024-02-01 ENCOUNTER — Telehealth: Payer: Self-pay

## 2024-02-01 ENCOUNTER — Encounter (HOSPITAL_BASED_OUTPATIENT_CLINIC_OR_DEPARTMENT_OTHER): Payer: Self-pay | Admitting: Family Medicine

## 2024-02-01 VITALS — BP 131/76 | HR 67 | Wt 137.3 lb

## 2024-02-01 DIAGNOSIS — E782 Mixed hyperlipidemia: Secondary | ICD-10-CM | POA: Diagnosis not present

## 2024-02-01 DIAGNOSIS — T466X5A Adverse effect of antihyperlipidemic and antiarteriosclerotic drugs, initial encounter: Secondary | ICD-10-CM

## 2024-02-01 DIAGNOSIS — G720 Drug-induced myopathy: Secondary | ICD-10-CM | POA: Diagnosis not present

## 2024-02-01 DIAGNOSIS — Z23 Encounter for immunization: Secondary | ICD-10-CM | POA: Diagnosis not present

## 2024-02-01 DIAGNOSIS — E0829 Diabetes mellitus due to underlying condition with other diabetic kidney complication: Secondary | ICD-10-CM | POA: Diagnosis not present

## 2024-02-01 DIAGNOSIS — R809 Proteinuria, unspecified: Secondary | ICD-10-CM | POA: Diagnosis not present

## 2024-02-01 DIAGNOSIS — I10 Essential (primary) hypertension: Secondary | ICD-10-CM | POA: Diagnosis not present

## 2024-02-01 LAB — HEMOGLOBIN A1C
Est. average glucose Bld gHb Est-mCnc: 134 mg/dL
Hgb A1c MFr Bld: 6.3 % — ABNORMAL HIGH (ref 4.8–5.6)

## 2024-02-01 MED ORDER — LOSARTAN POTASSIUM 25 MG PO TABS: 25.0000 mg | ORAL_TABLET | Freq: Every day | ORAL | 1 refills | Status: AC

## 2024-02-01 MED ORDER — METFORMIN HCL 850 MG PO TABS: 850.0000 mg | ORAL_TABLET | Freq: Two times a day (BID) | ORAL | 1 refills | Status: DC

## 2024-02-01 NOTE — Progress Notes (Signed)
 Established Patient Office Visit  Subjective   Patient ID: Robin Castro, female    DOB: October 02, 1940  Age: 83 y.o. MRN: [id number]  Chief Complaint  Patient presents with   Medical Management of Chronic Issues    F/u as above.  Please see last note for details.  Has been dealing with kidney stones and is nearly ready to have a stent removed.  Other than that, she looks good and was again commended on her healthy habits.    Past Medical History:  Diagnosis Date   Alopecia    f/by Ruthellen Domino.   Diabetes mellitus    GERD (gastroesophageal reflux disease)    History of cystocele 01/09/2020   Hyperlipidemia 10/21/2010   Nephrolithiasis 10/15/2020   f/by Urology in [place]   Osteoarthritis    Especially Cervical, f/by [facility].   Osteoporosis    f/by Endocrine   Paroxysmal SVT (supraventricular tachycardia) 05/11/2018   f/by Dr. Bernie   Second degree uterine prolapse 01/09/2020    Outpatient Encounter Medications as of 02/01/2024  Medication Sig   aspirin  81 MG tablet Take 81 mg by mouth daily. (Patient taking differently: Take 81 mg by mouth daily.  Currently not taking due to ablation on neck)   Calcium  Carbonate-Vitamin D  (CALCIUM  600 + D PO) Take 1 tablet by mouth 2 (two) times daily.   CARDIZEM  CD 300 MG 24 hr capsule Take 1 capsule (300 mg total) by mouth daily.   cyanocobalamin  (,VITAMIN B-12,) 1000 MCG/ML injection Inject 1,000 mcg into the muscle every 30 (thirty) days.   diazepam  (VALIUM ) 5 MG tablet Take 1 tablet (5 mg total) by mouth as needed.   EQUATE STOOL SOFTENER 100 MG capsule Take 100 mg by mouth daily.   ergocalciferol  (VITAMIN D2) 50000 UNITS capsule Take 50,000 Units by mouth every 14 (fourteen) days.   estradiol  (ESTRACE ) 0.1 MG/GM vaginal cream Place 1 Applicatorful vaginally daily.   fenofibrate  (TRICOR ) 145 MG tablet Take 1 tablet by mouth once daily   omeprazole (PRILOSEC) 20 MG capsule Take 20 mg by mouth as  needed (Reflux).   ONETOUCH ULTRA test strip 1 each by Other route as directed.   ZETIA  10 MG tablet Take 1 tablet (10 mg total) by mouth daily.   [DISCONTINUED] losartan  (COZAAR ) 25 MG tablet Take 1 tablet (25 mg total) by mouth daily.   [DISCONTINUED] metFORMIN  (GLUCOPHAGE ) 850 MG tablet Take 850 mg by mouth 2 (two) times daily with a meal.   Coenzyme Q10 (CO Q 10 PO) Take 100 mg by mouth daily. (Patient not taking: Reported on 02/01/2024)   Fe Fum-FA-B Cmp-C-Zn-Mg-Mn-Cu (FERROCITE PLUS) 106-1 MG TABS Take 1 tablet by mouth daily. (Patient not taking: Reported on 02/01/2024)   losartan  (COZAAR ) 25 MG tablet Take 1 tablet (25 mg total) by mouth daily.   meloxicam (MOBIC) 15 MG tablet Take 15 mg by mouth daily. (Patient not taking: Reported on 02/01/2024)   metFORMIN  (GLUCOPHAGE ) 850 MG tablet Take 1 tablet (850 mg total) by mouth 2 (two) times daily with a meal.   No facility-administered encounter medications on file as of 02/01/2024.    Social History   Tobacco Use   Smoking status: Never   Smokeless tobacco: Never  Vaping Use   Vaping status: Never Used  Substance Use Topics   Alcohol use: No   Drug use: No      Review of Systems  Constitutional:  Negative for diaphoresis, fever, malaise/fatigue and weight loss.  Respiratory:  Negative for cough, shortness of breath and wheezing.   Cardiovascular:  Negative for chest pain, palpitations, orthopnea, claudication, leg swelling and PND.      Objective:     BP 131/76   Pulse 67   Wt 137 lb 4.8 oz (62.3 kg)   SpO2 98%   BMI 24.32 kg/m    Physical Exam Constitutional:      General: She is not in acute distress.    Appearance: Normal appearance.  HENT:     Head: Normocephalic.  Neck:     Vascular: No carotid bruit.  Cardiovascular:     Rate and Rhythm: Normal rate and regular rhythm.     Pulses: Normal pulses.     Heart sounds: Normal heart sounds.  Pulmonary:     Effort: Pulmonary effort is normal.     Breath  sounds: Normal breath sounds.  Abdominal:     General: Bowel sounds are normal.     Palpations: Abdomen is soft.  Musculoskeletal:     Cervical back: Neck supple. No tenderness.     Right lower leg: No edema.     Left lower leg: No edema.     Comments: Feet look fine.  Neurological:     Mental Status: She is alert.      No results found for any visits on 02/01/24.    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  Await A1c and f/u with her soon.  Orders: -     metFORMIN  HCl; Take 1 tablet (850 mg total) by mouth 2 (two) times daily with a meal.  Dispense: 180 tablet; Refill: 1 -     Hemoglobin A1c  Mixed hyperlipidemia Assessment & Plan: Continue current rx.   Primary hypertension Assessment & Plan: Satisfactory control.  Orders: -     Losartan  Potassium; Take 1 tablet (25 mg total) by mouth daily.  Dispense: 90 tablet; Refill: 1  Encounter for immunization -     Flu vaccine HIGH DOSE PF(Fluzone Trivalent)  Statin myopathy    Return in about 6 months (around 08/01/2024) for chronic follow-up.    [name] PONCE NORLEEN FALCON., MD

## 2024-02-01 NOTE — Telephone Encounter (Signed)
   Pre-operative Risk Assessment    Patient Name: Robin Castro  DOB: January 31, 1941 MRN: [id number]  Date of last office visit: 07-10-2023 Date of next office visit: 02-18-2024   Request for Surgical Clearance  Procedure:  Pain Injection  Date of Surgery:  Clearance TBD                                 Surgeon:  Not Listed Surgeon's Group or Practice Name:  [facility] Phone number:  [phone] Fax number:  [phone]  Type of Clearance Requested:   - Pharmacy:  Hold Aspirin  10 days prior to injection  Type of Anesthesia:  Not Indicated  Additional requests/questions:    [name]   02/01/2024, 8:26 AM

## 2024-02-01 NOTE — Assessment & Plan Note (Signed)
 Continue current rx.  Await A1c and f/u with her soon.

## 2024-02-01 NOTE — Assessment & Plan Note (Signed)
Continue current rx.

## 2024-02-01 NOTE — Assessment & Plan Note (Signed)
Satisfactory control. 

## 2024-02-02 ENCOUNTER — Ambulatory Visit (HOSPITAL_BASED_OUTPATIENT_CLINIC_OR_DEPARTMENT_OTHER): Payer: Self-pay | Admitting: Family Medicine

## 2024-02-03 NOTE — Telephone Encounter (Signed)
   Patient Name: Robin Castro  DOB: 01-11-1941 MRN: [id number]  Primary Cardiologist: Lamar Fitch, MD  Chart reviewed as part of pre-operative protocol coverage. Given past medical history and time since last visit, based on ACC/AHA guidelines, MAYTAL MIJANGOS is at acceptable risk for the planned procedure without further cardiovascular testing.   The patient was advised that if she develops new symptoms prior to surgery to contact our office to arrange for a follow-up visit, and she verbalized understanding.  Patient may hold ASA for 7 days prior to procedure and begin again ASAP after hemodynamically stable.   I will route this recommendation to the requesting party via Epic fax function and remove from pre-op pool.  Please call with questions.  Lamarr Satterfield, NP 02/03/2024, 7:36 AM

## 2024-02-18 ENCOUNTER — Encounter: Payer: Self-pay | Admitting: Cardiology

## 2024-02-18 ENCOUNTER — Ambulatory Visit: Attending: Cardiology | Admitting: Cardiology

## 2024-02-18 VITALS — BP 110/68 | HR 67 | Ht 63.0 in | Wt 138.0 lb

## 2024-02-18 DIAGNOSIS — E7800 Pure hypercholesterolemia, unspecified: Secondary | ICD-10-CM

## 2024-02-18 DIAGNOSIS — I739 Peripheral vascular disease, unspecified: Secondary | ICD-10-CM

## 2024-02-18 DIAGNOSIS — I471 Supraventricular tachycardia, unspecified: Secondary | ICD-10-CM | POA: Diagnosis not present

## 2024-02-18 NOTE — Progress Notes (Signed)
 Cardiology Office Note:    Date:  02/18/2024   ID:  Cimone, Fahey 1940/09/30, MRN [id number]  PCP:  Dottie Norleen PHEBE PONCE, MD  Cardiologist:  Lamar Fitch, MD    Referring MD: Dottie Norleen PHEBE PONCE, MD   Chief Complaint  Patient presents with   Follow-up  Doing fine cardiac wise  History of Present Illness:    Robin Castro is a 83 y.o. female past medical history significant for supraventricular tachycardia she is on Cardizem  CD 300 mg which seems to be working quite well, dyslipidemia she is on Zetia  however without difficulty tolerating other medications, also carotic arterial disease but up to 39% stenosis, comes today for regular follow-up, cardiac wise doing well.  Denies of any chest pain tightness squeezing pressure mid chest no palpitation dizziness swelling of lower extremities.  She is struggling with kidney stones and she required lithotripsy everything went well now look like she does have some problem with cervical spine and ablation is being planned.  This will be done at the beginning of December.  Still quite active and is able to walk with no problem  Past Medical History:  Diagnosis Date   Alopecia    f/by [facility].   Diabetes mellitus    GERD (gastroesophageal reflux disease)    History of cystocele 01/09/2020   Hyperlipidemia 10/21/2010   Nephrolithiasis 10/15/2020   f/by Urology in [place]   Osteoarthritis    Especially Cervical, f/by [facility].   Osteoporosis    f/by Endocrine   Paroxysmal SVT (supraventricular tachycardia) 05/11/2018   f/by Dr. Fitch   Second degree uterine prolapse 01/09/2020    Past Surgical History:  Procedure Laterality Date   APPENDECTOMY     BILATERAL SALPINGECTOMY  04/19/2014   Procedure: BILATERAL SALPINGECTOMY;  Surgeon: Rosaline DELENA Luna, MD;  Location: [facility];  Service: Gynecology;;   BLADDER SUSPENSION N/A 04/19/2014   Procedure: TRANSVAGINAL TAPE (TVT) PROCEDURE;  Surgeon:  Rosaline DELENA Luna, MD;  Location: [facility];  Service: Gynecology;  Laterality: N/A;   BREAST BIOPSY Left    BREAST LUMPECTOMY WITH RADIOACTIVE SEED LOCALIZATION Left 06/06/2020   Procedure: LEFT BREAST LUMPECTOMY WITH RADIOACTIVE SEED LOCALIZATION;  Surgeon: Belinda Cough, MD;  Location: [facility];  Service: General;  Laterality: Left;   COLONOSCOPY     CYSTOCELE REPAIR N/A 04/19/2014   Procedure: ANTERIOR REPAIR (CYSTOCELE);  Surgeon: Rosaline DELENA Luna, MD;  Location: [facility];  Service: Gynecology;  Laterality: N/A;   CYSTOSCOPY N/A 04/19/2014   Procedure: CYSTOSCOPY;  Surgeon: Rosaline DELENA Luna, MD;  Location: [facility];  Service: Gynecology;  Laterality: N/A;   TOTAL KNEE ARTHROPLASTY     VAGINAL HYSTERECTOMY N/A 04/19/2014   Procedure: HYSTERECTOMY VAGINAL;  Surgeon: Rosaline DELENA Luna, MD;  Location: [facility];  Service: Gynecology;  Laterality: N/A;    Current Medications: Current Meds  Medication Sig   Calcium  Carbonate-Vitamin D  (CALCIUM  600 + D PO) Take 1 tablet by mouth 2 (two) times daily.   CARDIZEM  CD 300 MG 24 hr capsule Take 1 capsule (300 mg total) by mouth daily.   cyanocobalamin  (,VITAMIN B-12,) 1000 MCG/ML injection Inject 1,000 mcg into the muscle every 30 (thirty) days.   diazepam  (VALIUM ) 5 MG tablet Take 1 tablet (5 mg total) by mouth as needed.   EQUATE STOOL SOFTENER 100 MG capsule Take 100 mg by mouth daily.   ergocalciferol  (VITAMIN D2) 50000 UNITS capsule Take 50,000 Units by mouth every 14 (fourteen)  days.   estradiol  (ESTRACE ) 0.1 MG/GM vaginal cream Place 1 Applicatorful vaginally daily.   fenofibrate  (TRICOR ) 145 MG tablet Take 1 tablet by mouth once daily   losartan  (COZAAR ) 25 MG tablet Take 1 tablet (25 mg total) by mouth daily.   metFORMIN  (GLUCOPHAGE ) 850 MG tablet Take 1 tablet (850 mg total) by mouth 2 (two) times daily with a meal.   omeprazole (PRILOSEC) 20 MG capsule Take 20 mg by mouth as needed (Reflux).   ONETOUCH ULTRA test strip 1 each  by Other route as directed.   ZETIA  10 MG tablet Take 1 tablet (10 mg total) by mouth daily.     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  Tobacco Use   Smoking status: Never   Smokeless tobacco: Never  Vaping Use   Vaping status: Never Used  Substance and Sexual Activity   Alcohol use: No   Drug use: No   Sexual activity: Yes    Birth control/protection: Post-menopausal  Other Topics Concern   Not on file  Social History Narrative   Not on file   Social Drivers of Health   Financial Resource Strain: Low Risk  (01/08/2024)   Received from [facility]   Overall Financial Resource Strain (CARDIA)    How hard is it for you to pay for the very basics like food, housing, medical care, and heating?: Not hard at all  Food Insecurity: No Food Insecurity (01/08/2024)   Received from [facility]   Hunger Vital Sign    Within the past 12 months, you worried that your food would run out before you got the money to buy more.: Never true    Within the past 12 months, the food you bought just didn't last and you didn't have money to get more.: Never true  Transportation Needs: No Transportation Needs (01/08/2024)   Received from [facility] - Transportation    In the past 12 months, has lack of transportation kept you from medical appointments or from getting medications?: No    In the past 12 months, has lack of transportation kept you from meetings, work, or from getting things needed for daily living?: No  Physical Activity: Sufficiently Active (01/08/2024)   Received from [facility]   Exercise Vital Sign    On average, how many days per week do you engage in  moderate to strenuous exercise (like a brisk walk)?: 5 days    On average, how many minutes do you engage in exercise at this level?: 70 min  Stress: No Stress Concern Present (01/21/2024)   Received from [facility] of Occupational Health - Occupational Stress Questionnaire    Do you feel stress - tense, restless, nervous, or anxious, or unable to sleep at night because your mind is troubled all the time - these days?: Not at all  Social Connections: Socially Integrated (01/08/2024)   Received from [facility]   Social Network    How would you rate your social network (family, work, friends)?: Good participation with social networks     Family History: The patient's family history includes Diabetes in an other family  member; Heart disease in an other family member; Hyperlipidemia in an other family member; Hypertension in an other family member. ROS:   Please see the history of present illness.    All 14 point review of systems negative except as described per history of present illness  EKGs/Labs/Other Studies Reviewed:         Recent Labs: 10/05/2023: ALT 8; BUN 19; Creatinine, Ser 0.63; Hemoglobin 12.2; Platelets 391; Potassium 5.1; Sodium 140  Recent Lipid Panel    Component Value Date/Time   CHOL 195 10/05/2023 1004   TRIG 160 (H) 10/05/2023 1004   HDL 55 10/05/2023 1004   CHOLHDL 3.5 10/05/2023 1004   LDLCALC 112 (H) 10/05/2023 1004    Physical Exam:    VS:  BP 110/68   Pulse 67   Ht 5' 3 (1.6 m)   Wt 138 lb (62.6 kg)   SpO2 99%   BMI 24.45 kg/m     Wt Readings from Last 3 Encounters:  02/18/24 138 lb (62.6 kg)  02/01/24 137 lb 4.8 oz (62.3 kg)  01/06/24 135 lb (61.2 kg)     GEN:  Well nourished, well developed in no acute distress HEENT: Normal NECK: No JVD; No carotid bruits LYMPHATICS: No lymphadenopathy CARDIAC: RRR, no murmurs, no rubs, no gallops RESPIRATORY:  Clear to auscultation without rales, wheezing or rhonchi   ABDOMEN: Soft, non-tender, non-distended MUSCULOSKELETAL:  No edema; No deformity  SKIN: Warm and dry LOWER EXTREMITIES: no swelling NEUROLOGIC:  Alert and oriented x 3 PSYCHIATRIC:  Normal affect   ASSESSMENT:    1. Paroxysmal SVT (supraventricular tachycardia)   2. Peripheral vascular disease, unspecified (HCC) up to 60% right internal carotid artery   3. Hypercholesteremia    PLAN:    In order of problems listed above:  Paroxysmal supraventricular tachycardia controlled denies any palpitation continue present management. Peripheral vascular disease with last cardiac ultrasound showed only up to 39% stenosis.  Continue present management. Dyslipidemia, she is taking Zetia  I did review KPN which show me LDL 112 HDL 55.  She is intolerant to statin and she is not interested in getting any more medication for cholesterol.  She will continue monitoring done   Medication Adjustments/Labs and Tests Ordered: Current medicines are reviewed at length with the patient today.  Concerns regarding medicines are outlined above.  Orders Placed This Encounter  Procedures   EKG 12-Lead   Medication changes: No orders of the defined types were placed in this encounter.   Signed, Lamar DOROTHA Fitch, MD, [facility] 02/18/2024 9:42 AM    [facility]

## 2024-02-18 NOTE — Patient Instructions (Signed)

## 2024-02-20 ENCOUNTER — Other Ambulatory Visit: Payer: Self-pay | Admitting: Cardiology

## 2024-02-22 ENCOUNTER — Ambulatory Visit (HOSPITAL_BASED_OUTPATIENT_CLINIC_OR_DEPARTMENT_OTHER)

## 2024-02-29 ENCOUNTER — Other Ambulatory Visit (HOSPITAL_BASED_OUTPATIENT_CLINIC_OR_DEPARTMENT_OTHER): Payer: Self-pay | Admitting: Family Medicine

## 2024-02-29 ENCOUNTER — Ambulatory Visit (INDEPENDENT_AMBULATORY_CARE_PROVIDER_SITE_OTHER): Admitting: *Deleted

## 2024-02-29 DIAGNOSIS — R809 Proteinuria, unspecified: Secondary | ICD-10-CM

## 2024-02-29 DIAGNOSIS — D510 Vitamin B12 deficiency anemia due to intrinsic factor deficiency: Secondary | ICD-10-CM | POA: Diagnosis not present

## 2024-02-29 MED ORDER — CYANOCOBALAMIN 1000 MCG/ML IJ SOLN
1000.0000 ug | Freq: Once | INTRAMUSCULAR | Status: AC
  Administered 2024-02-29: 1000 ug via INTRAMUSCULAR

## 2024-02-29 MED ORDER — METFORMIN HCL 500 MG PO TABS: 500.0000 mg | ORAL_TABLET | Freq: Two times a day (BID) | ORAL | 1 refills | Status: AC

## 2024-02-29 NOTE — Progress Notes (Unsigned)
 Patient is in office today for a nurse visit for B12 Injection. Patient Injection was given in the  Right deltoid. Patient tolerated injection well.

## 2024-03-07 ENCOUNTER — Encounter: Payer: Self-pay | Admitting: "Endocrinology

## 2024-03-07 ENCOUNTER — Ambulatory Visit: Admitting: "Endocrinology

## 2024-03-07 ENCOUNTER — Other Ambulatory Visit

## 2024-03-07 VITALS — BP 100/80 | HR 92 | Ht 63.0 in | Wt 137.0 lb

## 2024-03-07 DIAGNOSIS — M810 Age-related osteoporosis without current pathological fracture: Secondary | ICD-10-CM

## 2024-03-07 DIAGNOSIS — Z9189 Other specified personal risk factors, not elsewhere classified: Secondary | ICD-10-CM | POA: Diagnosis not present

## 2024-03-07 NOTE — Progress Notes (Signed)
 [facility] Note Obadiah Birmingham, MD    Referring Provider: Dottie Norleen PHEBE PONCE, MD Primary Care Provider: Dottie Norleen PHEBE PONCE, MD No chief complaint on file.    Assessment & Plan  Diagnoses and all orders for this visit:  Age-related osteoporosis without current pathological fracture  At high risk for fracture     Osteoporosis Likely secondary cause from age. 10/08/2023 T-score results suggest: Advanced osteoporosis with bone density of -2.9 at L-spine and -3.5 at left forearm.  Continue current use of calcium  600 mg twice daily with Vit D and vitamin D  50,000 units once every 2 weeks supplements. Continue weight bearing exercise options and dietary supplements.   Educated on risks and side effects of fosamax/zoledronic acid and denosumab including but not limited to esophagitis, worsening GERD, atypical femoral fractures and osteonecrosis of the jaw.   Patient is concerned about losing her teeth. She has 3 sisters all of which have lost teeth, and all of which did Fosamax for 6 to 8 years.  Patient understands that we will generally give drug holiday after initial treatment of 5 years or less, based on the bone density and clinical condition at that time.  Advised patient to follow-up with her dentist.  Given written names for both Reclast and Prolia, patient is interested in Reclast at this time.  Got calcium  and vitamin D  levels checked today.  Will schedule treatment once patient replies us  back after consulting and getting cleared from her dentist.  Advised fall precautions, adequate dairy in diet and exercises (aerobic, balancing and weight bearing) as tolerated.   No follow-ups on file.  I have reviewed current medications, nurse's notes, allergies, vital signs, past medical and surgical history, family medical history, and social history for this encounter. Counseled patient on symptoms, examination findings, lab findings, imaging results, treatment decisions  and monitoring and prognosis. The patient understood the recommendations and agrees with the treatment plan. All questions regarding treatment plan were fully answered.   Obadiah Birmingham, MD   03/07/24    History of Present Illness Robin Castro is a 83 y.o. year old female who presents to our clinic with osteoporosis diagnosed in 10/2023. She is currently taking Calcium  600 mg twice daily with vitamin D  AND 50,000 units capsule every 2 weeks.  Risk Factors screening:  History of low trauma fractures: No Family history of osteoporosis: Yes Hip fracture in first-degree relatives: No Smoking history: No Excessive alcohol intake >2 drinks/day: No Excessive caffeine intake >2 drinks/day: No Glucocorticoid use >5mg  prednisone/day for >3 months: Yes (on some shots in neck starting 10/2023) Rheumatoid arthritis history: No Premature/Surgical Menopause: No   Anti-epileptic drugs No  Celiac disease/signs of malabsorption No  Gastric bypass/gastrectomy No  PPI use: yes  TZD use No  Gonadotropin/androgen suppressing drugs No  Aromatase Inhibitors No  Thyroid  hormone suppressive therapy No   Walks 3 mi a day, 5 days a week  DUAL X-RAY ABSORPTIOMETRY (DXA) FOR BONE MINERAL DENSITY 10/08/2023    CLINICAL DATA:  83 year old Female Postmenopausal. Postmenopausal estrogen deficiency; screening for osteoporosis   History of fragility fracture.   TECHNIQUE: An axial (e.g., hips, spine) and/or appendicular (e.g., radius) exam was performed, as appropriate, using GE Secretary/administrator at [employer]. Images are obtained for bone mineral density measurement and are not obtained for diagnostic purposes. [id number]   Exclusions: None.   COMPARISON:  None.   FINDINGS: Scan quality: Good.   LUMBAR SPINE (L1-L4):  BMD (in g/cm2): 0.832   T-score: -2.9   Z-score: -1.0   LEFT FEMORAL NECK:   BMD (in g/cm2): 0.699   T-score: -2.4   Z-score: -0.2   LEFT  TOTAL HIP:   BMD (in g/cm2): 0.711   T-score: -2.4   Z-score: -0.3   RIGHT FEMORAL NECK:   BMD (in g/cm2): 0.720   T-score: -2.3   Z-score: -0.1   RIGHT TOTAL HIP:   BMD (in g/cm2): 0.783   T-score: -1.8   Z-score: 0.3   LEFT FOREARM (RADIUS 33%):   BMD (in g/cm2): 0.566   T-score: -3.5   Z-score: -0.7   FRAX 10-YEAR PROBABILITY OF FRACTURE:   FRAX not reported as the lowest BMD is not in the osteopenia range.   IMPRESSION: Osteoporosis based on BMD.   Fracture risk is increased. Increased risk is based on low BMD, and history of fragility fracture.   RECOMMENDATIONS: 1. All patients should optimize calcium  and vitamin D  intake.   2. Consider FDA-approved medical therapies in postmenopausal women and men aged 77 years and older, based on the following:   - A hip or vertebral (clinical or morphometric) fracture   - T-score less than or equal to -2.5 and secondary causes have been excluded.   - Low bone mass (T-score between -1.0 and -2.5) and a 10-year probability of a hip fracture greater than or equal to 3% or a 10-year probability of a major osteoporosis-related fracture greater than or equal to 20% based on the [place] -adapted WHO algorithm.   - Clinician judgment and/or patient preferences may indicate treatment for people with 10-year fracture probabilities above or below these levels   3. Patients with diagnosis of osteoporosis or at high risk for fracture should have regular bone mineral density tests. For patients eligible for Medicare, routine testing is allowed once every 2 years. The testing frequency can be increased to one year for patients who have rapidly progressing disease, those who are receiving or discontinuing medical therapy to restore bone mass, or have additional risk factors.  Physical Exam  BP 100/80   Pulse 92   Ht 5' 3 (1.6 m)   Wt 137 lb (62.1 kg)   SpO2 98%   BMI 24.27 kg/m  Constitutional: well developed, well  nourished Head: normocephalic, atraumatic Eyes: sclera anicteric, no redness Neck: supple Lungs: normal respiratory effort Neurology: alert and oriented Skin: dry, no appreciable rashes Musculoskeletal: no appreciable defects Psychiatric: normal mood and affect  Allergies Allergies  Allergen Reactions   Lansoprazole     Throat swelling   Statins Hives    Tolerates Crestor    Empagliflozin Other (See Comments)    Recurrent UTI   Glimepiride Other (See Comments)    A1C dropped too low   Enalapril Cough   Niacin And Related Hives and Rash    hives    Current Medications Patient's Medications  New Prescriptions   No medications on file  Previous Medications   ASPIRIN  81 MG TABLET    Take 81 mg by mouth daily.   CALCIUM  CARBONATE-VITAMIN D  (CALCIUM  600 + D PO)    Take 1 tablet by mouth 2 (two) times daily.   CARDIZEM  CD 300 MG 24 HR CAPSULE    Take 1 capsule (300 mg total) by mouth daily.   CYANOCOBALAMIN  (,VITAMIN B-12,) 1000 MCG/ML INJECTION    Inject 1,000 mcg into the muscle every 30 (thirty) days.   DIAZEPAM  (VALIUM ) 5 MG TABLET    Take 1 tablet (5  mg total) by mouth as needed.   EQUATE STOOL SOFTENER 100 MG CAPSULE    Take 100 mg by mouth daily.   ERGOCALCIFEROL  (VITAMIN D2) 50000 UNITS CAPSULE    Take 50,000 Units by mouth every 14 (fourteen) days.   ESTRADIOL  (ESTRACE ) 0.1 MG/GM VAGINAL CREAM    Place 1 Applicatorful vaginally daily.   FENOFIBRATE  (TRICOR ) 145 MG TABLET    Take 1 tablet by mouth once daily   LOSARTAN  (COZAAR ) 25 MG TABLET    Take 1 tablet (25 mg total) by mouth daily.   METFORMIN  (GLUCOPHAGE ) 500 MG TABLET    Take 1 tablet (500 mg total) by mouth 2 (two) times daily with a meal.   OMEPRAZOLE (PRILOSEC) 20 MG CAPSULE    Take 20 mg by mouth as needed (Reflux).   ONETOUCH ULTRA TEST STRIP    1 each by Other route as directed.   ZETIA  10 MG TABLET    Take 1 tablet (10 mg total) by mouth daily.  Modified Medications   No medications on file  Discontinued  Medications   No medications on file     Past Medical History Past Medical History:  Diagnosis Date   Alopecia    f/by [facility]ino.   Diabetes mellitus    GERD (gastroesophageal reflux disease)    History of cystocele 01/09/2020   Hyperlipidemia 10/21/2010   Nephrolithiasis 10/15/2020   f/by Urology in [place]   Osteoarthritis    Especially Cervical, f/by [facility].   Osteoporosis    f/by Endocrine   Paroxysmal SVT (supraventricular tachycardia) 05/11/2018   f/by Dr. Bernie   Second degree uterine prolapse 01/09/2020    Past Surgical History Past Surgical History:  Procedure Laterality Date   APPENDECTOMY     BILATERAL SALPINGECTOMY  04/19/2014   Procedure: BILATERAL SALPINGECTOMY;  Surgeon: Rosaline DELENA Luna, MD;  Location: [facility];  Service: Gynecology;;   BLADDER SUSPENSION N/A 04/19/2014   Procedure: TRANSVAGINAL TAPE (TVT) PROCEDURE;  Surgeon: Rosaline DELENA Luna, MD;  Location: [facility];  Service: Gynecology;  Laterality: N/A;   BREAST BIOPSY Left    BREAST LUMPECTOMY WITH RADIOACTIVE SEED LOCALIZATION Left 06/06/2020   Procedure: LEFT BREAST LUMPECTOMY WITH RADIOACTIVE SEED LOCALIZATION;  Surgeon: Belinda Cough, MD;  Location:  [facility];  Service: General;  Laterality: Left;   COLONOSCOPY     CYSTOCELE REPAIR N/A 04/19/2014   Procedure: ANTERIOR REPAIR (CYSTOCELE);  Surgeon: Rosaline DELENA Luna, MD;  Location: [facility];  Service: Gynecology;  Laterality: N/A;   CYSTOSCOPY N/A 04/19/2014   Procedure: CYSTOSCOPY;  Surgeon: Rosaline DELENA Luna, MD;  Location: [facility];  Service: Gynecology;  Laterality: N/A;   TOTAL KNEE ARTHROPLASTY     VAGINAL HYSTERECTOMY N/A 04/19/2014   Procedure: HYSTERECTOMY VAGINAL;  Surgeon: Rosaline DELENA Luna, MD;  Location: [facility];  Service: Gynecology;  Laterality: N/A;    Family History family history includes Diabetes in an other family member; Heart disease in an other family member; Hyperlipidemia in an  other family member; Hypertension in an other family member.  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  Tobacco Use   Smoking status: Never   Smokeless tobacco: Never  Vaping Use   Vaping status: Never Used  Substance and Sexual Activity   Alcohol use: No   Drug use: No   Sexual activity: Yes    Birth control/protection: Post-menopausal  Other Topics Concern   Not on file  Social History Narrative   Not on file   Social Drivers of Health   Financial Resource Strain: Low Risk  (01/08/2024)   Received from [facility]   Overall Financial Resource Strain (CARDIA)    How hard is it for you to pay for the very basics like food, housing, medical care, and heating?: Not hard at all  Food Insecurity: No Food Insecurity (01/08/2024)   Received from [facility]   Hunger Vital Sign    Within the past 12 months, you worried that your food would run out before you got the money to buy more.: Never true    Within the past 12 months, the food you bought just didn't last and you didn't have money to get more.: Never true  Transportation Needs: No Transportation Needs (01/08/2024)   Received from [facility] - Transportation    In the past 12 months, has lack of transportation kept you from medical appointments or from getting medications?: No    In the past 12 months, has lack of transportation kept you from meetings, work, or from getting things needed for daily living?: No  Physical Activity: Sufficiently Active (01/08/2024)   Received from [facility]   Exercise Vital Sign    On average, how many days per week do you engage in moderate to strenuous exercise (like a brisk walk)?: 5 days    On average, how many minutes do you engage in exercise at this  level?: 70 min  Stress: No Stress Concern Present (01/21/2024)   Received from [facility] of Occupational Health - Occupational Stress Questionnaire    Do you feel stress - tense, restless, nervous, or anxious, or unable to sleep at night because your mind is troubled all the time - these days?: Not at all  Social Connections: Socially Integrated (01/08/2024)   Received from [facility]   Social Network    How would you rate your social network (family, work, friends)?: Good participation with social networks  Intimate Partner Violence: Not At Risk (01/08/2024)   Received from [facility]   HITS    Over the last 12 months how often did your partner physically hurt you?: Never    Over the last 12 months how often did your partner insult you or talk down to you?: Never    Over the last 12 months how often did your partner threaten you with physical harm?: Never    Over the last 12 months how often did your partner scream or curse at you?: Never    Laboratory Investigations No components found for: CMP No components found for: BMP No results found for: GFR Lab Results  Component Value Date   CREATININE 0.63 10/05/2023   No results found for: CBC No components found for: LFT No components found for: VITD No results found for: PTH  Lab Results  Component Value Date   TSH 5.040 (H) 12/02/2022    No components found for: RENAL FUNCTION No components found for: MAGNESIUM  Parts of this note may have been dictated using voice recognition software. There may be variances in spelling and vocabulary which are unintentional. Not all errors are proofread. Please notify the dino if any discrepancies are noted or if the meaning of any  statement is not clear.

## 2024-03-08 LAB — RENAL FUNCTION PANEL
Albumin: 4.5 g/dL (ref 3.6–5.1)
BUN/Creatinine Ratio: 32 (calc) — ABNORMAL HIGH (ref 6–22)
BUN: 18 mg/dL (ref 7–25)
CO2: 29 mmol/L (ref 20–32)
Calcium: 10.1 mg/dL (ref 8.6–10.4)
Chloride: 104 mmol/L (ref 98–110)
Creat: 0.57 mg/dL — ABNORMAL LOW (ref 0.60–0.95)
Glucose, Bld: 94 mg/dL (ref 65–99)
Phosphorus: 3.6 mg/dL (ref 2.1–4.3)
Potassium: 4.4 mmol/L (ref 3.5–5.3)
Sodium: 141 mmol/L (ref 135–146)

## 2024-03-08 LAB — PTH, INTACT AND CALCIUM
Calcium: 10.4 mg/dL (ref 8.6–10.4)
PTH: 15 pg/mL — ABNORMAL LOW (ref 16–77)

## 2024-03-08 LAB — VITAMIN D 25 HYDROXY (VIT D DEFICIENCY, FRACTURES): Vit D, 25-Hydroxy: 73 ng/mL (ref 30–100)

## 2024-03-16 ENCOUNTER — Telehealth: Payer: Self-pay | Admitting: "Endocrinology

## 2024-03-16 NOTE — Telephone Encounter (Signed)
 Patient called this morning,stating she is not going to do the infusion and has cancelled her future appointments.

## 2024-04-04 ENCOUNTER — Ambulatory Visit (INDEPENDENT_AMBULATORY_CARE_PROVIDER_SITE_OTHER)

## 2024-04-04 DIAGNOSIS — D510 Vitamin B12 deficiency anemia due to intrinsic factor deficiency: Secondary | ICD-10-CM

## 2024-04-04 MED ORDER — CYANOCOBALAMIN 1000 MCG/ML IJ SOLN
1000.0000 ug | Freq: Once | INTRAMUSCULAR | Status: AC
  Administered 2024-04-04: 1000 ug via INTRAMUSCULAR

## 2024-04-04 NOTE — Progress Notes (Signed)
 Patient is in office today for a nurse visit for B12 Injection. Patient Injection was given in the  Right deltoid. Patient tolerated injection well.

## 2024-04-19 ENCOUNTER — Ambulatory Visit (HOSPITAL_BASED_OUTPATIENT_CLINIC_OR_DEPARTMENT_OTHER): Admitting: Student

## 2024-04-19 ENCOUNTER — Encounter (HOSPITAL_BASED_OUTPATIENT_CLINIC_OR_DEPARTMENT_OTHER): Payer: Self-pay | Admitting: Student

## 2024-04-19 VITALS — BP 123/69 | HR 68 | Temp 98.6°F | Resp 16 | Ht 63.0 in | Wt 140.7 lb

## 2024-04-19 DIAGNOSIS — J0110 Acute frontal sinusitis, unspecified: Secondary | ICD-10-CM | POA: Diagnosis not present

## 2024-04-19 MED ORDER — AMOXICILLIN-POT CLAVULANATE 875-125 MG PO TABS: 1.0000 | ORAL_TABLET | Freq: Two times a day (BID) | ORAL | 0 refills | Status: AC

## 2024-04-19 NOTE — Patient Instructions (Signed)
 It was nice to see you today!  If you have any problems before your next visit feel free to message me via MyChart (minor issues or questions) or call the office, otherwise you may reach out to schedule an office visit.  Thank you! Pau Banh, PA-C

## 2024-04-19 NOTE — Progress Notes (Signed)
 "  Acute Office Visit  Subjective:     Patient ID: Robin Castro, female    DOB: Oct 18, 1940, 84 y.o.   MRN: [id number]  Chief Complaint  Patient presents with   Cough    Cough for over 1 week. Congested and feels like it is getting into her chest. Worse at night. Been taking mucinex. No fevers.    HPI  Discussed the use of AI scribe software for clinical note transcription with the patient, who gave verbal consent to proceed.  History of Present Illness   Robin Castro is an 84 year old female who presents with a persistent cough for over a week.  She has experienced a persistent cough for over a week, which began with a mild sore throat that did not worsen significantly. This was followed by symptoms resembling a head cold. The cough has not improved and is particularly bothersome at night, affecting her sleep.  The cough is productive, with yellow phlegm being expectorated. She mentions a sensation of the illness moving into her chest, which has caused concern. No history of COPD. She does not smoke or drink alcohol.  No fever during this time. She recalls attending a church luncheon over a week ago, which she suspects might be where she contracted the illness.  She has been taking Mucinex and a cough syrup at night to manage her symptoms. Despite these measures, she feels that her condition has not improved.  She experienced a headache yesterday, which resolved by last night. She experienced a mild headache yesterday, which resolved by last night.  She confirms receiving her flu vaccine this year and denies recent exposure to sick individuals.       ROS Per HPI     Objective:    BP 123/69   Pulse 68   Temp 98.6 F (37 C) (Oral)   Resp 16   Ht 5' 3 (1.6 m)   Wt 140 lb 11.2 oz (63.8 kg)   SpO2 96%   BMI 24.92 kg/m    Physical Exam Constitutional:      General: She is not in acute distress.    Appearance: Normal appearance. She is not ill-appearing.   HENT:     Head: Normocephalic and atraumatic.     Right Ear: Tympanic membrane, ear canal and external ear normal.     Left Ear: Ear canal and external ear normal.     Ears:     Comments: Left cerumen    Nose: Congestion present.     Comments: Some facial pain    Mouth/Throat:     Mouth: Mucous membranes are moist.     Pharynx: Oropharynx is clear. Posterior oropharyngeal erythema present. No oropharyngeal exudate.  Eyes:     General: No scleral icterus.    Conjunctiva/sclera: Conjunctivae normal.  Cardiovascular:     Rate and Rhythm: Normal rate and regular rhythm.     Heart sounds: Normal heart sounds. No murmur heard.    No friction rub.  Pulmonary:     Effort: Pulmonary effort is normal. No respiratory distress.     Breath sounds: Normal breath sounds. No wheezing, rhonchi or rales.  Musculoskeletal:        General: Normal range of motion.  Skin:    General: Skin is warm and dry.     Coloration: Skin is not jaundiced or pale.  Neurological:     General: No focal deficit present.     Mental Status: She is alert.  Psychiatric:        Mood and Affect: Mood normal.        Behavior: Behavior normal.     No results found for any visits on 04/19/24.      Assessment & Plan:   Assessment and Plan    Acute frontal sinusitis Cough persisting for over a week with yellow sputum, sore throat, and postnasal drip. No fever. Lungs clear on examination. Throat red and irritated, likely due to postnasal drip. Possible sinus infection suspected due to duration and symptoms. No signs of pneumonia or bronchitis. Facial pain and headache noted, but improving. No significant lymphadenopathy. - Prescribed Augmentin  twice daily for 7 days with food to prevent gastrointestinal upset. - Advised to monitor symptoms and report if no improvement after 7 days. - Sent prescription to pharmacy on [address].       No orders of the defined types were placed in this encounter.   Return if  symptoms worsen or fail to improve.  Joelynn Dust T Donivan Thammavong, PA-C  "

## 2024-04-25 ENCOUNTER — Ambulatory Visit: Payer: Self-pay | Admitting: *Deleted

## 2024-04-25 NOTE — Telephone Encounter (Signed)
 Routing to PCP for review.

## 2024-04-25 NOTE — Telephone Encounter (Signed)
 FYI Only or Action Required?: Action required by provider: request for appointment.  Patient was last seen in primary care on 04/19/2024 by Rothfuss, Lang DASEN, PA-C.  Called Nurse Triage reporting Cough.  Symptoms began a week ago.  Interventions attempted: Delsym and amoxicillin   Symptoms are: gradually worsening.  Triage Disposition: See Physician Within 24 Hours  Patient/caregiver understands and will follow disposition?: No, wishes to speak with PCP   Reason for Triage: Patient c/o severe cough for 3 weeks, chest pain and rib pain due to coughing spells. She was seen on 01/13 and prescribed amoxicillin . Has yellow-green color- mucous.  Reason for Disposition  [1] Continuous (nonstop) coughing interferes with work or school AND [2] no improvement using cough treatment per Care Advice  Answer Assessment - Initial Assessment Questions Patient has resent OV and was diagnosed with URI and was treated with antibiotic. Patient states her symptoms have moved into the chest. She is concerned because her last antibiotic is today. Patient is requesting an appointment with her PCP- today or tomorrow- no open appointment- will send message for review and scheduling.    1. ONSET: When did the cough begin?      OV 1/13-not as bad, was given Amoxicillin -Pot Clavulanate 875-125 MG 1 tablet Oral 2 times daily 2. SEVERITY: How bad is the cough today?      Keeping patient up at night, productive 3. SPUTUM: Describe the color of your sputum (e.g., none, dry cough; clear, white, yellow, green)     Yellow/green 4. HEMOPTYSIS: Are you coughing up any blood? If Yes, ask: How much? (e.g., flecks, streaks, tablespoons, etc.)     no 5. DIFFICULTY BREATHING: Are you having difficulty breathing? If Yes, ask: How bad is it? (e.g., mild, moderate, severe)      no 6. FEVER: Do you have a fever? If Yes, ask: What is your temperature, how was it measured, and when did it start?     no 7.  CARDIAC HISTORY: Do you have any history of heart disease? (e.g., heart attack, congestive heart failure)      Hypertension, SVT 8. LUNG HISTORY: Do you have any history of lung disease?  (e.g., pulmonary embolus, asthma, emphysema)     no 9. PE RISK FACTORS: Do you have a history of blood clots? (or: recent major surgery, recent prolonged travel, bedridden)     no 10. OTHER SYMPTOMS: Do you have any other symptoms? (e.g., runny nose, wheezing, chest pain)       Chest pain-only with cough, nasal congestion  12. TRAVEL: Have you traveled out of the country in the last month? (e.g., travel history, exposures)       unsure  Protocols used: Cough - Acute Productive-A-AH

## 2024-04-26 ENCOUNTER — Ambulatory Visit (HOSPITAL_BASED_OUTPATIENT_CLINIC_OR_DEPARTMENT_OTHER): Admit: 2024-04-26 | Discharge: 2024-04-26 | Disposition: A | Admitting: Radiology

## 2024-04-26 ENCOUNTER — Ambulatory Visit (HOSPITAL_BASED_OUTPATIENT_CLINIC_OR_DEPARTMENT_OTHER): Payer: Self-pay | Admitting: Family Medicine

## 2024-04-26 ENCOUNTER — Encounter (HOSPITAL_BASED_OUTPATIENT_CLINIC_OR_DEPARTMENT_OTHER): Payer: Self-pay | Admitting: Emergency Medicine

## 2024-04-26 ENCOUNTER — Ambulatory Visit (HOSPITAL_BASED_OUTPATIENT_CLINIC_OR_DEPARTMENT_OTHER)
Admission: EM | Admit: 2024-04-26 | Discharge: 2024-04-26 | Disposition: A | Discharge status: Home / Self Care | Attending: Family Medicine | Admitting: Family Medicine

## 2024-04-26 DIAGNOSIS — R0989 Other specified symptoms and signs involving the circulatory and respiratory systems: Secondary | ICD-10-CM | POA: Diagnosis not present

## 2024-04-26 DIAGNOSIS — J189 Pneumonia, unspecified organism: Secondary | ICD-10-CM

## 2024-04-26 DIAGNOSIS — R051 Acute cough: Secondary | ICD-10-CM

## 2024-04-26 MED ORDER — PROMETHAZINE-DM 6.25-15 MG/5ML PO SYRP: 5.0000 mL | ORAL_SOLUTION | Freq: Four times a day (QID) | ORAL | 0 refills | Status: AC | PRN

## 2024-04-26 MED ORDER — TRIAMCINOLONE ACETONIDE 40 MG/ML IJ SUSP
40.0000 mg | Freq: Once | INTRAMUSCULAR | Status: AC
  Administered 2024-04-26: 40 mg via INTRAMUSCULAR

## 2024-04-26 MED ORDER — DOXYCYCLINE HYCLATE 100 MG PO CAPS: 100.0000 mg | ORAL_CAPSULE | Freq: Two times a day (BID) | ORAL | 0 refills | Status: AC

## 2024-04-26 NOTE — ED Provider Notes (Signed)
 " [facility]    CSN: [id number] Arrival date & time: 04/26/24  1040      History   Chief Complaint No chief complaint on file.   HPI Robin Castro is a 84 y.o. female.   Patient was seen on 04/19/2024 by primary care and diagnosed with sinusitis.  She was treated with Augmentin  875-125 mg, 1 pill twice daily for 7 days.  She feels like she is some better.  Her nasal congestion has improved.  She has completed the antibiotic.  But her cough is persisting and she just cannot seem to get rid of the cough.  She is concerned that she might have developed bronchitis or pneumonia.     Past Medical History:  Diagnosis Date   Alopecia    f/by Ruthellen Domino.   Diabetes mellitus    GERD (gastroesophageal reflux disease)    History of cystocele 01/09/2020   Hyperlipidemia 10/21/2010   Nephrolithiasis 10/15/2020   f/by Urology in [place]   Osteoarthritis    Especially Cervical, f/by [facility].   Osteoporosis    f/by Endocrine   Paroxysmal SVT (supraventricular tachycardia) 05/11/2018   f/by Dr. Bernie   Second degree uterine prolapse 01/09/2020    Patient Active Problem List   Diagnosis Date Noted   Statin myopathy 02/01/2024   Hypertension 02/01/2024   Left ureteral stone 12/29/2023   Facet joint disease 11/23/2023   Cervical spondylosis without myelopathy 11/04/2023   Bilateral occipital neuralgia 10/15/2023   Pernicious anemia 10/05/2023   Anxiety 10/05/2023   Osteopenia 10/05/2023   Female cystocele 09/03/2023   Chronic neck pain with normal neurological examination 09/03/2023   Nephrolithiasis 10/15/2020   SVD (spontaneous vaginal delivery)    Pulsatile abdominal mass    PONV (postoperative nausea and vomiting)    Palpitations    Hypercholesteremia    GERD (gastroesophageal reflux disease)    Dysrhythmia    Chest discomfort    History of cystocele 01/09/2020   Pain in pelvis 01/09/2020   Second degree uterine prolapse  01/09/2020   Peripheral vascular disease, unspecified (HCC) up to 60% right internal carotid artery 07/05/2019   Diabetes mellitus due to underlying condition with microalbuminuria (HCC) 05/31/2018   Paroxysmal SVT (supraventricular tachycardia) 05/11/2018   Left sided sciatica 05/08/2016   Postoperative state 04/19/2014   S/P TKR (total knee replacement) 09/30/2012   Hyperlipidemia 10/21/2010   OTHER SYMPTOMS INVOLVING CARDIOVASCULAR SYSTEM 02/28/2009    Past Surgical History:  Procedure Laterality Date   APPENDECTOMY     BILATERAL SALPINGECTOMY  04/19/2014   Procedure: BILATERAL SALPINGECTOMY;  Surgeon: Rosaline DELENA Luna, MD;  Location: [facility];  Service: Gynecology;;   BLADDER SUSPENSION N/A 04/19/2014   Procedure: TRANSVAGINAL TAPE (TVT) PROCEDURE;  Surgeon: Rosaline DELENA Luna, MD;  Location: [facility];  Service: Gynecology;  Laterality: N/A;   BREAST BIOPSY Left    BREAST LUMPECTOMY WITH RADIOACTIVE SEED LOCALIZATION Left 06/06/2020   Procedure: LEFT BREAST LUMPECTOMY WITH RADIOACTIVE SEED LOCALIZATION;  Surgeon: Belinda Cough, MD;  Location: [facility];  Service: General;  Laterality: Left;   COLONOSCOPY     CYSTOCELE REPAIR N/A 04/19/2014   Procedure: ANTERIOR REPAIR (CYSTOCELE);  Surgeon: Rosaline DELENA Luna, MD;  Location: [facility];  Service: Gynecology;  Laterality: N/A;   CYSTOSCOPY N/A 04/19/2014   Procedure: CYSTOSCOPY;  Surgeon: Rosaline DELENA Luna, MD;  Location: [facility];  Service: Gynecology;  Laterality: N/A;   TOTAL KNEE ARTHROPLASTY     VAGINAL HYSTERECTOMY N/A  04/19/2014   Procedure: HYSTERECTOMY VAGINAL;  Surgeon: Rosaline DELENA Luna, MD;  Location: [facility];  Service: Gynecology;  Laterality: N/A;    OB History   No obstetric history on file.      Home Medications    Prior to Admission medications  Medication Sig Start Date End Date Taking? Authorizing Provider  CARDIZEM  CD 300 MG 24 hr capsule Take 1 capsule (300 mg total) by mouth daily. 07/10/23  Yes  Krasowski, Robert J, MD  doxycycline  (VIBRAMYCIN ) 100 MG capsule Take 1 capsule (100 mg total) by mouth 2 (two) times daily for 10 days. 04/26/24 05/06/24 Yes Ival Domino, FNP  fenofibrate  (TRICOR ) 145 MG tablet Take 1 tablet by mouth once daily 02/23/24  Yes Krasowski, Robert J, MD  losartan  (COZAAR ) 25 MG tablet Take 1 tablet (25 mg total) by mouth daily. 02/01/24  Yes Dottie Norleen PHEBE PONCE, MD  metFORMIN  (GLUCOPHAGE ) 500 MG tablet Take 1 tablet (500 mg total) by mouth 2 (two) times daily with a meal. 02/29/24  Yes Dottie Norleen PHEBE PONCE, MD  promethazine -dextromethorphan (PROMETHAZINE -DM) 6.25-15 MG/5ML syrup Take 5 mLs by mouth 4 (four) times daily as needed for cough. Do not use and drive - May make drowsy. 04/26/24  Yes Ival Domino, FNP  amoxicillin -clavulanate (AUGMENTIN ) 875-125 MG tablet Take 1 tablet by mouth 2 (two) times daily for 7 days. 04/19/24 04/26/24  Rothfuss, Jacob T, PA-C  aspirin  81 MG tablet Take 81 mg by mouth daily.    [provider]  Calcium  Carbonate-Vitamin D  (CALCIUM  600 + D PO) Take 1 tablet by mouth 2 (two) times daily.    [provider]  cyanocobalamin  (,VITAMIN B-12,) 1000 MCG/ML injection Inject 1,000 mcg into the muscle every 30 (thirty) days.    [provider]  diazepam  (VALIUM ) 5 MG tablet Take 1 tablet (5 mg total) by mouth as needed. 10/05/23   Dottie Norleen PHEBE PONCE, MD  EQUATE STOOL SOFTENER 100 MG capsule Take 100 mg by mouth daily. 03/18/23   [provider]  ergocalciferol  (VITAMIN D2) 50000 UNITS capsule Take 50,000 Units by mouth every 14 (fourteen) days.    [provider]  estradiol  (ESTRACE ) 0.1 MG/GM vaginal cream Place 1 Applicatorful vaginally daily. Patient taking differently: Place 1 Applicatorful vaginally as needed. 01/12/23   [provider]  omeprazole (PRILOSEC) 20 MG capsule Take 20 mg by mouth as needed (Reflux).    [provider]  [facility] ULTRA test strip 1 each by Other route as  directed. 05/02/23   [provider]  ZETIA  10 MG tablet Take 1 tablet (10 mg total) by mouth daily. 07/10/23   Bernie Lamar PARAS, MD    Family History Family History  Problem Relation Age of Onset   Heart disease Other    Diabetes Other    Hypertension Other    Hyperlipidemia Other     Social History Social History[1]   Allergies   Lansoprazole, Statins, Empagliflozin, Glimepiride, Enalapril, and Niacin and related   Review of Systems Review of Systems  Constitutional:  Negative for chills and fever.  HENT:  Negative for ear pain and sore throat.   Eyes:  Negative for pain and visual disturbance.  Respiratory:  Positive for cough. Negative for shortness of breath.   Cardiovascular:  Negative for chest pain and palpitations.  Gastrointestinal:  Negative for abdominal pain, constipation, diarrhea, nausea and vomiting.  Genitourinary:  Negative for dysuria and hematuria.  Musculoskeletal:  Negative for arthralgias and back pain.  Skin:  Negative for color change and rash.  Neurological:  Negative for seizures and syncope.  All other systems reviewed and are negative.    Physical Exam Triage Vital Signs ED Triage Vitals  Encounter Vitals Group     BP 04/26/24 1126 128/79     Girls Systolic BP Percentile --      Girls Diastolic BP Percentile --      Boys Systolic BP Percentile --      Boys Diastolic BP Percentile --      Pulse Rate 04/26/24 1126 72     Resp 04/26/24 1126 18     Temp 04/26/24 1126 98.5 F (36.9 C)     Temp Source 04/26/24 1126 Oral     SpO2 04/26/24 1126 96 %     Weight --      Height --      Head Circumference --      Peak Flow --      Pain Score 04/26/24 1125 0     Pain Loc --      Pain Education --      Exclude from Growth Chart --    No data found.  Updated Vital Signs BP 128/79 (BP Location: Right Arm)   Pulse 72   Temp 98.5 F (36.9 C) (Oral)   Resp 18   SpO2 96%   Visual Acuity Right Eye Distance:   Left Eye  Distance:   Bilateral Distance:    Right Eye Near:   Left Eye Near:    Bilateral Near:     Physical Exam Vitals and nursing note reviewed.  Constitutional:      General: She is not in acute distress.    Appearance: She is well-developed. She is not ill-appearing, toxic-appearing or diaphoretic.  HENT:     Head: Normocephalic and atraumatic.     Right Ear: Hearing, tympanic membrane, ear canal and external ear normal.     Left Ear: Hearing, tympanic membrane, ear canal and external ear normal.     Nose: Congestion and rhinorrhea present. Rhinorrhea is clear.     Right Sinus: No maxillary sinus tenderness or frontal sinus tenderness.     Left Sinus: No maxillary sinus tenderness or frontal sinus tenderness.     Mouth/Throat:     Lips: Pink.     Mouth: Mucous membranes are moist.     Pharynx: Uvula midline. No oropharyngeal exudate or posterior oropharyngeal erythema.     Tonsils: No tonsillar exudate.  Eyes:     Conjunctiva/sclera: Conjunctivae normal.     Pupils: Pupils are equal, round, and reactive to light.  Cardiovascular:     Rate and Rhythm: Normal rate and regular rhythm.     Heart sounds: S1 normal and S2 normal. No murmur heard. Pulmonary:     Effort: Pulmonary effort is normal. No respiratory distress.     Breath sounds: Examination of the right-upper field reveals wheezing. Examination of the left-upper field reveals wheezing. Wheezing (Rare inspiratory wheeze) present. No decreased breath sounds, rhonchi or rales.  Abdominal:     General: Bowel sounds are normal.     Palpations: Abdomen is soft.     Tenderness: There is no abdominal tenderness.  Musculoskeletal:        General: No swelling.     Cervical back: Neck supple.  Lymphadenopathy:     Head:     Right side of head: No submental, submandibular, tonsillar, preauricular or posterior auricular adenopathy.     Left side of  head: No submental, submandibular, tonsillar, preauricular or posterior auricular  adenopathy.     Cervical: No cervical adenopathy.     Right cervical: No superficial cervical adenopathy.    Left cervical: No superficial cervical adenopathy.  Skin:    General: Skin is warm and dry.     Capillary Refill: Capillary refill takes less than 2 seconds.     Findings: No rash.  Neurological:     Mental Status: She is alert and oriented to person, place, and time.  Psychiatric:        Mood and Affect: Mood normal.      UC Treatments / Results  Labs (all labs ordered are listed, but only abnormal results are displayed) Labs Reviewed - No data to display  EKG   Radiology No results found.  Procedures Procedures (including critical care time)  Medications Ordered in UC Medications  triamcinolone  acetonide (KENALOG -40) injection 40 mg (40 mg Intramuscular Given 04/26/24 1320)    Initial Impression / Assessment and Plan / UC Course  I have reviewed the triage vital signs and the nursing notes.  Pertinent labs & imaging results that were available during my care of the patient were reviewed by me and considered in my medical decision making (see chart for details).  Plan of Care (see discharge instructions for additional patient precautions and education): Pneumonia versus bronchitis with a cough: X-ray has a hazy area on the right lower lobe.  This may just be signs of early bronchitis but it could be early pneumonia.  Given the patient's symptoms we will treat for pneumonia.  Patient is diabetic but she is in good control.  Will use Kenalog  40 mg IM now (this is a steroid injection).  To help with the wheezing and cough.  Doxycycline  100 mg twice daily for 10 days.  Promethazine  DM, 5 mL, every 6 hours if needed for cough.  Do not use the Promethazine  DM and drive.  Get plenty of fluids and rest.  Encouraged to make an appointment with primary care for 3 weeks from now to recheck lungs and make sure any pneumonia has resolved.  Follow-up if needed.  I reviewed the  plan of care with the patient and/or the patient's guardian.  The patient and/or guardian had time to ask questions and acknowledged that the questions were answered.  Final Clinical Impressions(s) / UC Diagnoses   Final diagnoses:  Acute cough  Community acquired pneumonia, unspecified laterality     Discharge Instructions      Pneumonia versus bronchitis with a cough: X-ray has a hazy area on the right lower lobe.  This may just be signs of early bronchitis but it could be early pneumonia.  Given the patient's symptoms we will treat for pneumonia.  Patient is diabetic but she is in good control.  Will use Kenalog  40 mg IM now (this is a steroid injection).  To help with the wheezing and cough.  Doxycycline  100 mg twice daily for 10 days.  Promethazine  DM, 5 mL, every 6 hours if needed for cough.  Do not use the Promethazine  DM and drive.  Get plenty of fluids and rest.  Encouraged to make an appointment with primary care for 3 weeks from now to recheck lungs and make sure any pneumonia has resolved.  Follow-up if needed.     ED Prescriptions     Medication Sig Dispense Auth. Provider   promethazine -dextromethorphan (PROMETHAZINE -DM) 6.25-15 MG/5ML syrup Take 5 mLs by mouth 4 (four) times daily as  needed for cough. Do not use and drive - May make drowsy. 118 mL Ival Domino, FNP   doxycycline  (VIBRAMYCIN ) 100 MG capsule Take 1 capsule (100 mg total) by mouth 2 (two) times daily for 10 days. 20 capsule Ival Domino, FNP      PDMP not reviewed this encounter.    [1]  Social History Tobacco Use   Smoking status: Never    Passive exposure: Never   Smokeless tobacco: Never  Vaping Use   Vaping status: Never Used  Substance Use Topics   Alcohol use: No   Drug use: No     Ival Domino, FNP 04/26/24 1322  "

## 2024-04-26 NOTE — Discharge Instructions (Addendum)
 Pneumonia versus bronchitis with a cough: X-ray has a hazy area on the right lower lobe.  This may just be signs of early bronchitis but it could be early pneumonia.  Given the patient's symptoms we will treat for pneumonia.  Patient is diabetic but she is in good control.  Will use Kenalog  40 mg IM now (this is a steroid injection).  To help with the wheezing and cough.  Doxycycline  100 mg twice daily for 10 days.  Promethazine  DM, 5 mL, every 6 hours if needed for cough.  Do not use the Promethazine  DM and drive.  Get plenty of fluids and rest.  Encouraged to make an appointment with primary care for 3 weeks from now to recheck lungs and make sure any pneumonia has resolved.  Follow-up if needed.

## 2024-04-26 NOTE — ED Triage Notes (Signed)
 Pt c/o cough, nasal congestion she seen Lang last week was diagnosed with sinus infection and was given antibiotic but she can't get rid of her cough.

## 2024-04-26 NOTE — Progress Notes (Signed)
 Radiology read the x-ray is negative.  Patient had been updated on the results during the visit and advised that although there was some hazy area on her x-ray I generally thought it was negative with no consolidation.  But based on her symptoms I felt like she had early pneumonia and would be treating her for pneumonia.  I called tonight to let her know that radiology did read her film is negative.  She will take the antibiotics as planned and follow-up with Dr. Dottie as planned.

## 2024-05-03 ENCOUNTER — Telehealth: Payer: Self-pay | Admitting: *Deleted

## 2024-05-03 MED ORDER — DILTIAZEM HCL ER COATED BEADS 300 MG PO CP24: 300.0000 mg | ORAL_CAPSULE | Freq: Every day | ORAL | 2 refills | Status: AC

## 2024-05-03 NOTE — Telephone Encounter (Signed)
 Cardizem  CD 300 MG 24 hr is on back order. She can take Cartia  XT 300 per Dr. MARLA. Sent to pharmacy

## 2024-05-05 ENCOUNTER — Ambulatory Visit (INDEPENDENT_AMBULATORY_CARE_PROVIDER_SITE_OTHER): Admitting: *Deleted

## 2024-05-05 DIAGNOSIS — D510 Vitamin B12 deficiency anemia due to intrinsic factor deficiency: Secondary | ICD-10-CM | POA: Diagnosis not present

## 2024-05-05 MED ORDER — CYANOCOBALAMIN 1000 MCG/ML IJ SOLN
1000.0000 ug | Freq: Once | INTRAMUSCULAR | Status: AC
  Administered 2024-05-05: 1000 ug via INTRAMUSCULAR

## 2024-05-05 NOTE — Progress Notes (Signed)
 Patient is in office today for a nurse visit for B12 Injection. Patient Injection was given in the  Left deltoid. Patient tolerated injection well.

## 2024-05-17 ENCOUNTER — Ambulatory Visit (HOSPITAL_BASED_OUTPATIENT_CLINIC_OR_DEPARTMENT_OTHER): Admitting: Family Medicine

## 2024-06-06 ENCOUNTER — Ambulatory Visit: Admitting: "Endocrinology

## 2024-06-07 ENCOUNTER — Ambulatory Visit (HOSPITAL_BASED_OUTPATIENT_CLINIC_OR_DEPARTMENT_OTHER)

## 2024-08-01 ENCOUNTER — Ambulatory Visit (HOSPITAL_BASED_OUTPATIENT_CLINIC_OR_DEPARTMENT_OTHER): Admitting: Family Medicine
